# Patient Record
Sex: Female | Born: 1970 | Race: White | Hispanic: No | Marital: Married | Smoking: Current every day smoker
Health system: Southern US, Community
[De-identification: ages and names within clinical notes are randomized; demographics above are authoritative.]

## PROBLEM LIST (undated history)

## (undated) DIAGNOSIS — R112 Nausea with vomiting, unspecified: Secondary | ICD-10-CM

## (undated) DIAGNOSIS — H3530 Unspecified macular degeneration: Secondary | ICD-10-CM

## (undated) DIAGNOSIS — H3553 Other dystrophies primarily involving the sensory retina: Secondary | ICD-10-CM

## (undated) DIAGNOSIS — M5136 Other intervertebral disc degeneration, lumbar region: Secondary | ICD-10-CM

## (undated) DIAGNOSIS — F419 Anxiety disorder, unspecified: Secondary | ICD-10-CM

## (undated) DIAGNOSIS — Z8489 Family history of other specified conditions: Secondary | ICD-10-CM

## (undated) DIAGNOSIS — E7800 Pure hypercholesterolemia, unspecified: Secondary | ICD-10-CM

## (undated) DIAGNOSIS — M797 Fibromyalgia: Secondary | ICD-10-CM

## (undated) DIAGNOSIS — H353190 Nonexudative age-related macular degeneration, unspecified eye, stage unspecified: Secondary | ICD-10-CM

## (undated) DIAGNOSIS — H539 Unspecified visual disturbance: Secondary | ICD-10-CM

## (undated) DIAGNOSIS — E0590 Thyrotoxicosis, unspecified without thyrotoxic crisis or storm: Secondary | ICD-10-CM

## (undated) DIAGNOSIS — E785 Hyperlipidemia, unspecified: Secondary | ICD-10-CM

## (undated) DIAGNOSIS — Z9889 Other specified postprocedural states: Secondary | ICD-10-CM

## (undated) DIAGNOSIS — M51369 Other intervertebral disc degeneration, lumbar region without mention of lumbar back pain or lower extremity pain: Secondary | ICD-10-CM

## (undated) DIAGNOSIS — G43909 Migraine, unspecified, not intractable, without status migrainosus: Secondary | ICD-10-CM

## (undated) DIAGNOSIS — M5126 Other intervertebral disc displacement, lumbar region: Secondary | ICD-10-CM

## (undated) DIAGNOSIS — E0500 Thyrotoxicosis with diffuse goiter without thyrotoxic crisis or storm: Secondary | ICD-10-CM

## (undated) DIAGNOSIS — K219 Gastro-esophageal reflux disease without esophagitis: Secondary | ICD-10-CM

## (undated) HISTORY — DX: Unspecified macular degeneration: H35.30

## (undated) HISTORY — DX: Hyperlipidemia, unspecified: E78.5

## (undated) HISTORY — DX: Thyrotoxicosis with diffuse goiter without thyrotoxic crisis or storm: E05.00

## (undated) HISTORY — DX: Nonexudative age-related macular degeneration, unspecified eye, stage unspecified: H35.3190

## (undated) HISTORY — DX: Pure hypercholesterolemia, unspecified: E78.00

## (undated) HISTORY — DX: Other dystrophies primarily involving the sensory retina: H35.53

## (undated) HISTORY — DX: Unspecified visual disturbance: H53.9

## (undated) HISTORY — DX: Other intervertebral disc degeneration, lumbar region: M51.36

## (undated) HISTORY — DX: Other intervertebral disc displacement, lumbar region: M51.26

## (undated) HISTORY — DX: Migraine, unspecified, not intractable, without status migrainosus: G43.909

## (undated) HISTORY — DX: Anxiety disorder, unspecified: F41.9

## (undated) HISTORY — DX: Other intervertebral disc degeneration, lumbar region without mention of lumbar back pain or lower extremity pain: M51.369

---

## 2002-10-22 ENCOUNTER — Ambulatory Visit (HOSPITAL_COMMUNITY): Admission: RE | Admit: 2002-10-22 | Discharge: 2002-10-22 | Payer: Self-pay | Admitting: Family Medicine

## 2002-10-22 ENCOUNTER — Encounter: Payer: Self-pay | Admitting: Family Medicine

## 2003-08-08 HISTORY — PX: PARTIAL HYSTERECTOMY: SHX80

## 2003-08-08 HISTORY — PX: APPENDECTOMY: SHX54

## 2004-08-04 HISTORY — PX: PARTIAL HYSTERECTOMY: SHX80

## 2004-08-04 HISTORY — PX: APPENDECTOMY: SHX54

## 2006-08-07 HISTORY — PX: LIPOMA RESECTION: SHX23

## 2006-12-13 HISTORY — PX: OTHER SURGICAL HISTORY: SHX169

## 2007-04-03 ENCOUNTER — Encounter: Admission: RE | Admit: 2007-04-03 | Discharge: 2007-04-03 | Payer: Self-pay | Admitting: Obstetrics and Gynecology

## 2007-08-30 ENCOUNTER — Ambulatory Visit: Payer: Self-pay | Admitting: Internal Medicine

## 2007-08-30 LAB — CONVERTED CEMR LAB
ALT: 33 units/L (ref 0–35)
AST: 27 units/L (ref 0–37)
Albumin: 3.7 g/dL (ref 3.5–5.2)
Alkaline Phosphatase: 95 units/L (ref 39–117)
BUN: 4 mg/dL — ABNORMAL LOW (ref 6–23)
Bilirubin, Direct: 0.1 mg/dL (ref 0.0–0.3)
CO2: 27 meq/L (ref 19–32)
Calcium: 9.4 mg/dL (ref 8.4–10.5)
Chloride: 105 meq/L (ref 96–112)
Creatinine, Ser: 0.8 mg/dL (ref 0.4–1.2)
GFR calc Af Amer: 104 mL/min
GFR calc non Af Amer: 86 mL/min
Glucose, Bld: 101 mg/dL — ABNORMAL HIGH (ref 70–99)
Potassium: 4 meq/L (ref 3.5–5.1)
Sed Rate: 12 mm/hr (ref 0–25)
Sodium: 138 meq/L (ref 135–145)
TSH: 0.03 microintl units/mL — ABNORMAL LOW (ref 0.35–5.50)
Tissue Transglutaminase Ab, IgA: 0.7 units (ref <7)
Total Bilirubin: 0.5 mg/dL (ref 0.3–1.2)
Total Protein: 6.4 g/dL (ref 6.0–8.3)

## 2007-09-05 ENCOUNTER — Telehealth: Payer: Self-pay | Admitting: Endocrinology

## 2007-09-09 ENCOUNTER — Encounter: Payer: Self-pay | Admitting: Endocrinology

## 2007-09-19 ENCOUNTER — Encounter: Payer: Self-pay | Admitting: Internal Medicine

## 2007-09-19 ENCOUNTER — Ambulatory Visit: Payer: Self-pay | Admitting: Internal Medicine

## 2007-09-26 ENCOUNTER — Encounter: Payer: Self-pay | Admitting: Endocrinology

## 2007-09-28 ENCOUNTER — Encounter: Payer: Self-pay | Admitting: Endocrinology

## 2007-10-09 ENCOUNTER — Ambulatory Visit: Payer: Self-pay | Admitting: Endocrinology

## 2007-10-17 ENCOUNTER — Telehealth (INDEPENDENT_AMBULATORY_CARE_PROVIDER_SITE_OTHER): Payer: Self-pay | Admitting: *Deleted

## 2007-10-23 ENCOUNTER — Telehealth (INDEPENDENT_AMBULATORY_CARE_PROVIDER_SITE_OTHER): Payer: Self-pay | Admitting: *Deleted

## 2007-10-23 ENCOUNTER — Ambulatory Visit: Payer: Self-pay | Admitting: Endocrinology

## 2007-10-23 LAB — CONVERTED CEMR LAB: TSH: 0.07 microintl units/mL — ABNORMAL LOW (ref 0.35–5.50)

## 2007-11-05 ENCOUNTER — Telehealth (INDEPENDENT_AMBULATORY_CARE_PROVIDER_SITE_OTHER): Payer: Self-pay | Admitting: *Deleted

## 2007-11-11 ENCOUNTER — Ambulatory Visit: Payer: Self-pay | Admitting: Endocrinology

## 2007-11-11 LAB — CONVERTED CEMR LAB
Free T4: 0.7 ng/dL (ref 0.6–1.6)
TSH: 0.05 microintl units/mL — ABNORMAL LOW (ref 0.35–5.50)

## 2007-12-02 ENCOUNTER — Telehealth (INDEPENDENT_AMBULATORY_CARE_PROVIDER_SITE_OTHER): Payer: Self-pay | Admitting: *Deleted

## 2007-12-11 ENCOUNTER — Ambulatory Visit: Payer: Self-pay | Admitting: Endocrinology

## 2007-12-11 DIAGNOSIS — M25569 Pain in unspecified knee: Secondary | ICD-10-CM | POA: Insufficient documentation

## 2007-12-11 DIAGNOSIS — M797 Fibromyalgia: Secondary | ICD-10-CM | POA: Insufficient documentation

## 2007-12-11 LAB — CONVERTED CEMR LAB
Free T4: 0.6 ng/dL (ref 0.6–1.6)
TSH: 1.7 microintl units/mL (ref 0.35–5.50)

## 2008-01-20 ENCOUNTER — Encounter: Payer: Self-pay | Admitting: Endocrinology

## 2008-01-22 ENCOUNTER — Ambulatory Visit: Payer: Self-pay | Admitting: Endocrinology

## 2008-02-10 ENCOUNTER — Ambulatory Visit: Payer: Self-pay | Admitting: Endocrinology

## 2008-02-10 LAB — CONVERTED CEMR LAB
Free T4: 0.8 ng/dL (ref 0.6–1.6)
TSH: 1.17 microintl units/mL (ref 0.35–5.50)

## 2008-02-17 ENCOUNTER — Encounter: Payer: Self-pay | Admitting: Endocrinology

## 2008-02-19 ENCOUNTER — Ambulatory Visit: Payer: Self-pay | Admitting: Cardiology

## 2008-02-24 ENCOUNTER — Ambulatory Visit: Payer: Self-pay | Admitting: Cardiology

## 2008-03-31 ENCOUNTER — Ambulatory Visit: Payer: Self-pay | Admitting: Cardiology

## 2008-04-10 ENCOUNTER — Telehealth (INDEPENDENT_AMBULATORY_CARE_PROVIDER_SITE_OTHER): Payer: Self-pay | Admitting: *Deleted

## 2008-04-22 ENCOUNTER — Encounter: Admission: RE | Admit: 2008-04-22 | Discharge: 2008-04-22 | Payer: Self-pay | Admitting: Obstetrics and Gynecology

## 2008-04-22 ENCOUNTER — Ambulatory Visit: Payer: Self-pay | Admitting: Endocrinology

## 2008-04-22 LAB — CONVERTED CEMR LAB: TSH: 1.47 microintl units/mL (ref 0.35–5.50)

## 2008-04-29 ENCOUNTER — Telehealth (INDEPENDENT_AMBULATORY_CARE_PROVIDER_SITE_OTHER): Payer: Self-pay | Admitting: *Deleted

## 2008-05-08 ENCOUNTER — Ambulatory Visit (HOSPITAL_COMMUNITY): Admission: RE | Admit: 2008-05-08 | Discharge: 2008-05-08 | Payer: Self-pay | Admitting: Internal Medicine

## 2008-05-08 ENCOUNTER — Telehealth: Payer: Self-pay | Admitting: Internal Medicine

## 2008-05-08 DIAGNOSIS — K625 Hemorrhage of anus and rectum: Secondary | ICD-10-CM | POA: Insufficient documentation

## 2008-05-13 ENCOUNTER — Telehealth: Payer: Self-pay | Admitting: Internal Medicine

## 2008-05-15 DIAGNOSIS — F411 Generalized anxiety disorder: Secondary | ICD-10-CM | POA: Insufficient documentation

## 2008-05-15 DIAGNOSIS — R197 Diarrhea, unspecified: Secondary | ICD-10-CM | POA: Insufficient documentation

## 2008-05-15 DIAGNOSIS — F3289 Other specified depressive episodes: Secondary | ICD-10-CM | POA: Insufficient documentation

## 2008-05-15 DIAGNOSIS — F329 Major depressive disorder, single episode, unspecified: Secondary | ICD-10-CM | POA: Insufficient documentation

## 2008-05-15 DIAGNOSIS — Z8719 Personal history of other diseases of the digestive system: Secondary | ICD-10-CM | POA: Insufficient documentation

## 2008-05-21 ENCOUNTER — Ambulatory Visit: Payer: Self-pay | Admitting: Internal Medicine

## 2008-05-22 ENCOUNTER — Encounter: Payer: Self-pay | Admitting: Internal Medicine

## 2008-05-22 ENCOUNTER — Ambulatory Visit: Payer: Self-pay | Admitting: Internal Medicine

## 2008-05-25 ENCOUNTER — Encounter: Payer: Self-pay | Admitting: Internal Medicine

## 2008-06-01 ENCOUNTER — Ambulatory Visit: Payer: Self-pay | Admitting: Endocrinology

## 2008-06-01 ENCOUNTER — Telehealth: Payer: Self-pay | Admitting: Internal Medicine

## 2008-06-01 LAB — CONVERTED CEMR LAB
Free T4: 0.8 ng/dL (ref 0.6–1.6)
TSH: 0.12 microintl units/mL — ABNORMAL LOW (ref 0.35–5.50)

## 2008-06-02 ENCOUNTER — Telehealth: Payer: Self-pay | Admitting: Endocrinology

## 2008-06-09 ENCOUNTER — Encounter: Payer: Self-pay | Admitting: Endocrinology

## 2008-06-24 ENCOUNTER — Telehealth (INDEPENDENT_AMBULATORY_CARE_PROVIDER_SITE_OTHER): Payer: Self-pay | Admitting: *Deleted

## 2008-07-08 ENCOUNTER — Ambulatory Visit: Payer: Self-pay | Admitting: Endocrinology

## 2008-07-09 ENCOUNTER — Telehealth (INDEPENDENT_AMBULATORY_CARE_PROVIDER_SITE_OTHER): Payer: Self-pay | Admitting: *Deleted

## 2008-08-17 ENCOUNTER — Ambulatory Visit: Payer: Self-pay | Admitting: Endocrinology

## 2008-08-17 DIAGNOSIS — E890 Postprocedural hypothyroidism: Secondary | ICD-10-CM | POA: Insufficient documentation

## 2008-08-18 ENCOUNTER — Telehealth (INDEPENDENT_AMBULATORY_CARE_PROVIDER_SITE_OTHER): Payer: Self-pay | Admitting: *Deleted

## 2008-08-19 ENCOUNTER — Telehealth (INDEPENDENT_AMBULATORY_CARE_PROVIDER_SITE_OTHER): Payer: Self-pay | Admitting: *Deleted

## 2008-10-06 ENCOUNTER — Ambulatory Visit: Payer: Self-pay | Admitting: Endocrinology

## 2008-10-19 ENCOUNTER — Telehealth: Payer: Self-pay | Admitting: Endocrinology

## 2008-11-24 ENCOUNTER — Ambulatory Visit: Payer: Self-pay | Admitting: Endocrinology

## 2008-11-24 LAB — CONVERTED CEMR LAB: TSH: 0.16 microintl units/mL — ABNORMAL LOW (ref 0.35–5.50)

## 2009-01-15 ENCOUNTER — Ambulatory Visit: Payer: Self-pay | Admitting: Endocrinology

## 2009-01-15 ENCOUNTER — Telehealth: Payer: Self-pay | Admitting: Endocrinology

## 2009-01-19 ENCOUNTER — Telehealth (INDEPENDENT_AMBULATORY_CARE_PROVIDER_SITE_OTHER): Payer: Self-pay | Admitting: *Deleted

## 2010-12-20 NOTE — Assessment & Plan Note (Signed)
[facility]                          [facility]   NAME:[name] [name], [name] [name]                     MRN:          [id number]  DATE:02/19/2008                            DOB:          1971/03/19    REFERRING PHYSICIAN:  Fara Chute   REQUESTING PHYSICIAN:  Dr. Fara Chute   REASON FOR CONSULTATION:  Feeling of palpitations and elevated heart  rate.   HISTORY OF PRESENT ILLNESS:  [name] [name] is a 37-year-woman with a  history of thyroid disease, specifically Graves disease, managed by Dr.  Romero Belling, on methimazole.  The patient states that this was  diagnosed back in February 2009 after it was noted that she had a  relatively elevated heart rate in January 2009 during a colonoscopy done  by Dr. Lina Sar.  She was placed on Inderal at that time and with  management of her thyroid disease felt significantly better.  She was  eventually weaned off of Inderal and has been off the medicine for  approximately the last 4 weeks.  She has noticed a general sense that  her heart rate has been increased and palpitations, although she states  that she saw Dr. Everardo All just recently and that her thyroid numbers were  in-line.  She has been placed back on low-dose Inderal in the meanwhile  and referred to [place] for further assessment.  She otherwise has no general  sense of exertional chest pain or dyspnea.  She has had no frank  dizziness or syncope.  Her electrocardiogram today is entirely normal  showing sinus rhythm at 84 beats per minute.  Dr. Neita Carp has felt there  might be an element of anxiety and placed her on alprazolam, although  she states that she was nauseated with this medicine and has not been  able to tolerate it.   ALLERGIES:  No known drug allergies.   PRESENT MEDICATIONS:  1. Methimazole 5 mg p.o. daily.  2. Propranolol 10 mg p.o. t.i.d.   PAST MEDICAL HISTORY:  As outlined above.  Also, a prior history of  partial  hysterectomy, appendectomy, and removal of a lipoma from the  back.  She has no cardiovascular disease history, no diabetes mellitus,  and no hypertension.  Lipid profile in 2007 demonstrated hyperlipidemia  with an LDL of 228, triglycerides of 182, HDL of 31, and LDL of 161.   SOCIAL HISTORY:  The patient denies any tobacco or alcohol use.  She is  a former smoker, however.  She states she has cut caffeine out of her  diet over the last one and half week, although has not noticed any  significant improvement.   FAMILY HISTORY:  Significant for cardiovascular disease, premature in  the patient's father.  No obvious dysrhythmia described in the family.   REVIEW OF SYSTEMS:  As outlined above.  She has had no fevers or chills,  cough, hemoptysis, lower extremity edema, orthopnea, or PND.  Bowel and  bladder habits have been normal recently.  She reports compliance with  her medications.  Otherwise negative.   PHYSICAL EXAMINATION:  Blood pressure 137/90, heart  rate in the 80s-90s  and regular, weight is 143 pounds, and height 5 feet 4 inches.  The patient is in no acute distress.  HEENT:  Conjunctivae normal.  Oropharynx clear.  NECK:  Supple.  No elevated jugular venous pressure.  No audible bruits.  LUNGS:  Clear without labored breathing at rest.  CARDIAC:  Reveals a regular rate and rhythm.  No pathologic murmurs or  S3 gallop.  No pericardial rub.  ABDOMEN:  Soft and nontender.  Normoactive bowel sounds.  EXTREMITIES:  Exhibit no frank pitting edema.  Pulses are 2+.  SKIN:  Warm and dry.  MUSCULOSKELETAL:  No kyphosis noted.  NEUROPSYCHIATRIC:  Alert and oriented x3.  Affect seems appropriate.   IMPRESSION AND RECOMMENDATIONS:  General sense of increased heart rate  and palpitations in a 40 year old woman with a history of Graves  disease, being managed with methimazole and propranolol, which was  initially weaned off, but has now been restarted.  My understanding is  that  recent thyroid studies indicate good control of thyroid disease.  The patient's resting electrocardiogram is normal.  She has had no frank  dizziness or syncope.  There are no pathologic murmurs on examination or  an S3 gallop.  I doubt that there is a primary cardiac cause for this.  She remains very concerned about this however and would like to undergo  at least some baseline cardiac evaluation.  What we have decided to do  after discussing the matter is place a 24-hour Holter to better document  heart rate variability and exclude any obvious paroxysmal dysrhythmias,  which I doubt at this point.  She states that she has these symptoms  daily, and I suspect a 24-hour monitor would be adequate.  We will also  obtain a 2-D echocardiogram to exclude cardiomyopathy.  I doubt there  will be any major valvular abnormalities or pericardial disease based on  exam.  We will then have her return to the office to discuss these  results.  In the meanwhile, I have asked her to increase her Inderal to  20 mg p.o. t.i.d.     Jonelle Sidle, MD  Electronically Signed    SGM/MedQ  DD: 02/19/2008  DT: 02/20/2008  Job #: [id number]   cc:   Gregary Signs A. Everardo All, MD  Fara Chute

## 2010-12-20 NOTE — Assessment & Plan Note (Signed)
[facility]                          [facility]   NAME:[name] [name] HEBEL                     MRN:          [id number]  DATE:03/31/2008                            DOB:          03-18-1971    PRIMARY CARE PHYSICIAN:  [name] [name].   ENDOCRINOLOGIST:  [name] [name]   REASON FOR VISIT:  Followup cardiac testing and palpitations.   HISTORY OF PRESENT ILLNESS:  I saw [name] [name] back in July 2009.  She  was referred at that time with a general sense of palpitations and  increased heart rate in the setting of Graves disease being managed with  methimazole and low-dose propranolol.  I increased her propranolol,  which she states significantly improved her symptoms.  We also placed a  Holter monitor.  She did not have any significant dysrhythmias.  Heart  rate ranged from 59 up to 131 with a mean of 87 beats per minute.  She  had an echocardiogram obtained, which demonstrates normal ventricle  systolic function at 50-55% without regional wall motion abnormalities  and no significant valvular abnormalities or pericardial effusion.  I  reviewed these studies with the patient today and she was reassured.  Overall, she reports feeling much better.   ALLERGIES:  No known drug allergies.   PRESENT MEDICATIONS:  1. Methimazole 5 mg p.o. daily.  2. Propranolol 20 mg p.o. t.i.d.   REVIEW OF SYSTEMS:  as described in the history present illness,  otherwise negative.   PHYSICAL EXAMINATION:  VITAL SIGNS:  Blood pressure is 118/76, heart  rate is 87 and regular, weight is 147 pounds.  NECK:  No elevated jugular venous pressure.  LUNGS:  Clear without labored breathing.  HEART:  Regular rate and rhythm.  No pathologic murmur or S3 gallop.  EXTREMITIES:  No pitting edema.   IMPRESSIONS AND RECOMMENDATIONS:  Palpitations in the setting of Graves  disease treated with methimazole and propranolol.  The patient feels  much better on  propranolol 20 mg p.o. t.i.d.  She has no evidence of  major cardiac structural disease or significant dysrhythmia.  Would not  suggest any further cardiac testing at this point.  She will continue to  follow up with  [name] [name] and [name] [name].  Would suggest continuing propranolol  particularly until further management of her thyroid disease has been  determined.     [name] Sidle, [name]  Electronically Signed    SGM/MedQ  DD: 03/31/2008  DT: 04/01/2008  Job #: [id number]   cc:   [name] Modena A. Everardo All, [name]

## 2010-12-20 NOTE — Assessment & Plan Note (Signed)
[facility]                         GASTROENTEROLOGY OFFICE NOTE   NAME:Dorsi, JEWELIA BOCCHINO                     MRN:          [id number]  DATE:08/30/2007                            DOB:          1971/06/15    NEW PATIENT EVALUATION  Ms. Covington is a 40 year old white female who comes in today for  evaluation of constant diarrhea.  She was evaluated in 2002 by Dr. Jena Gauss  in [place] for what was thought to be a C. difficile colitis as well  as IBS.  She at that time had a flexible sigmoidoscopy.  Her stool was  positive  for C. diff toxin.  She was treated with Flagyl  and had a  complete resolution of her diarrhea, which only later on recurred and  has lasted since then.  She describes urgent, looseas well as soft  stools post prandially within 30-40 minutes of eating.  This occurs  about twice a day, after lunch and after supper.  She does not eat  breakfast.  Her weight has increased somewhat, about 10 pounds.  Her  appetite has been good.  There is no nausea or vomiting.  She usually  has enough warning with crampy abdominal pain so she can make it to the  bathroom.  She feels generally much better after having a bowel  movement.  These symptoms never occur at night.  She has been on Chantix  for the past 26 days and has actually developed some constipation , but  she is going off Chantix the next week, and she is anticipating  recurrence of the diarrhea.   MEDICATIONS:  1. Chantix 1 daily.  2. Midrin p.r.n.   PAST HISTORY:  Colon polyps in her father.  She had depression.  Sinus  allergies.  Anxiety.  C. difficile diarrhea in 2002.  Flexible  sigmoidoscopy in 2002.   OPERATIONS:  Hysterectomy in December, 2005.  Appendectomy in December,  2005.  Lipoma removed from the back in May, 2008.  Bulging disk in the  neck, middle back, and lower back.   FAMILY HISTORY:  Positive for colon polyps in her father, heart disease  in father, uterine  cancer in maternal grandmother, diabetes in the  brother, breast cancer in the sister.   SOCIAL HISTORY:  Married with one child.  She has a Naval architect.  She works as a Occupational psychologist.  She is an ex-smoker,  stopped 26 days ago.  She does not drink any alcohol.   REVIEW OF SYSTEMS:  Positive for eye glasses, allergies, back pain.   PHYSICAL EXAMINATION:  Blood pressure 122/70, pulse 86, weight 137  pounds.  She was alert and oriented, very pleasant.  She laughed almost after  every sentence that she said, which was somewhat inappropriate.  Sclerae are anicteric.  There was no exophthalmos.  No resting tremor of  the arms.  NECK:  Supple.  Thyroid was not enlarged.  LUNGS:  Clear to auscultation.  COR:  Normal S1 and S2.  SKIN:  Warm and dry without any spider nevi or stigmata of chronic liver  disease.  ABDOMEN:  Soft with normoactive bowel sounds.  No distention.  No  hyperactive rushes.  There was no tenderness in the left or right lower  quadrant.  RECTAL:  Normal rectal tone.  Stool was hemoccult negative.   IMPRESSION:  65. A 40 year old whiote female with a history of Clostridium difficile      diarrhea, resolved on Flagyl in 2002, now recurrence of diarrhea,      mostly post prandially, suggestive of irritable bowel      syndrome/diarrhea.  Rule out inflammatory bowel disease.  Rule out      hypermetabolic state such as hyperthyroidism.  2. Rule out celiac sprue.  Patient denies a history of anemia or      lactose intolerance.  3. Rule out functional diarrhea.   PLAN:  1. Test tissue transglutaminase levels, TSH, sed rate, and CMET,      specifically albumin.  2. Begin Bentyl 20 mg 1 p.o. b.i.d., electronically prescribed.  3. Consider adding Paxil 10-20 mg at bedtime.  4. Colonoscopy scheduled for biopsy to rule out microscopic      collagenous colitis.     Hedwig Morton. Juanda Chance, MD  Electronically Signed    DMB/MedQ  DD: 08/30/2007  DT:  08/30/2007  Job #: [id number]   cc:   Fara Chute

## 2012-06-26 DIAGNOSIS — M1990 Unspecified osteoarthritis, unspecified site: Secondary | ICD-10-CM | POA: Diagnosis present

## 2013-09-26 ENCOUNTER — Ambulatory Visit (INDEPENDENT_AMBULATORY_CARE_PROVIDER_SITE_OTHER): Payer: Medicare Other | Admitting: Diagnostic Neuroimaging

## 2013-09-26 ENCOUNTER — Encounter (INDEPENDENT_AMBULATORY_CARE_PROVIDER_SITE_OTHER): Payer: Self-pay

## 2013-09-26 ENCOUNTER — Encounter: Payer: Self-pay | Admitting: Diagnostic Neuroimaging

## 2013-09-26 VITALS — BP 126/88 | HR 85 | Temp 97.5°F | Ht 64.0 in | Wt 147.0 lb

## 2013-09-26 DIAGNOSIS — R200 Anesthesia of skin: Secondary | ICD-10-CM

## 2013-09-26 DIAGNOSIS — R209 Unspecified disturbances of skin sensation: Secondary | ICD-10-CM

## 2013-09-26 DIAGNOSIS — M542 Cervicalgia: Secondary | ICD-10-CM

## 2013-09-26 MED ORDER — GABAPENTIN 300 MG PO CAPS: 300.0000 mg | ORAL_CAPSULE | Freq: Three times a day (TID) | ORAL | Status: DC

## 2013-09-26 NOTE — Progress Notes (Signed)
[facility]  PATIENT: [name] [name] DOB: [birth year]  REFERRING CLINICIAN: Sasser HISTORY FROM: patient and husband REASON FOR VISIT: new consult   HISTORICAL  CHIEF COMPLAINT:  Chief Complaint  Patient presents with  . Back Pain    between shoulder blades,   . Tingling    both arms    HISTORY OF PRESENT ILLNESS:   43 year old right-handed female here for evaluation of pain and numbness in the upper back and bilateral arms. Has history of Graves' disease, dry macular dystrophy, hypercholesterolemia, migraine, anxiety.  For past 7 weeks patient has developed numbness and tingling between her scapula, radiating in her bilateral shoulders and arms. Symptoms are gradually been worsening. No trauma or triggering events. No bowel or bladder dysfunction.   Patient has dry macular dystrophy diagnosed 2011, with minimal light perception and peripheral motion perception in the right eye and patch she visual acuity in the left eye. 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  In 2004 patient had low back pain radiating to right leg, diagnosed with disc bulging and treated conservatively.   REVIEW OF SYSTEMS: Full 14 system review of systems performed and notable only for a numbness feeling hot legally blind loss of vision weight gain fatigue palpitation.  ALLERGIES: Allergies  Allergen Reactions  . Prednisone     HOME MEDICATIONS: No outpatient prescriptions prior to visit.   No facility-administered medications prior to visit.    PAST MEDICAL HISTORY: Past Medical History  Diagnosis Date  . Graves disease   . Macular degeneration, dry   . High cholesterol   . Migraine   . Anxiety     PAST SURGICAL HISTORY: Past Surgical History  Procedure Laterality Date  . Partial hysterectomy  08-04-2004  . Appendectomy  08-04-2004  . Other surgical history  12-13-2006    fatty tumor removed    FAMILY HISTORY: Family History  Problem  Relation Age of Onset  . Other Father     degenerative disc disease  . Heart attack Father   . Diabetes Brother   . Breast cancer Sister     SOCIAL HISTORY:  History   Social History  . Marital Status: Married    Spouse Name: Legrand Como    Number of Children: 1  . Years of Education: College   Occupational History  . other     Disability   Social History Main Topics  . Smoking status: Current Every Day Smoker -- 1.00 packs/day for 20 years    Types: Cigarettes  . Smokeless tobacco: Never Used  . Alcohol Use: No  . Drug Use: No  . Sexual Activity: Not on file   Other Topics Concern  . Not on file   Social History Narrative   Patient lives at home with family.   Caffeine Use: 3 cans of soda daily     PHYSICAL EXAM  Filed Vitals:   09/26/13 0910  BP: 126/88  Pulse: 85  Temp: 97.5 F (36.4 C)  TempSrc: Oral  Height: 5\' 4"  (1.626 m)  Weight: 147 lb (66.679 kg)    Not recorded    Body mass index is 25.22 kg/(m^2).  GENERAL EXAM: Patient is in no distress; well developed, nourished and groomed; neck is supple  CARDIOVASCULAR: Regular rate and rhythm, no murmurs, no carotid bruits  NEUROLOGIC: MENTAL STATUS: awake, alert, oriented to person, place and time, recent and remote memory intact, normal attention and concentration, language fluent, comprehension intact, naming intact, fund of knowledge appropriate  CRANIAL NERVE: no papilledema on fundoscopic exam, pupils equal and reactive to light; RIGHT EYE MINIMAL LIGHT AND MOTION PERCEPTION IN THE INFERIOR PERIPHERAL FIELDS. LEFT EYE ABLE TO COUNT FINGERS. Extraocular muscles intact, no nystagmus, facial sensation and strength symmetric, hearing intact, palate elevates symmetrically, uvula midline, shoulder shrug symmetric, tongue midline. MOTOR: normal bulk and tone, full strength in the BUE, BLE SENSORY: DECR PP IN RIGHT HAND DIGIT 2. POSITIVE PHALEN'S SIGN. NEG TINEL'S. COORDINATION: finger-nose-finger, fine  finger movements normal REFLEXES: deep tendon reflexes present; SLIGHTLY INCREASED IN LUE (2+). POSITIVE LEFT HOFFMANS.  GAIT/STATION: narrow based gait; able to walk on toes, heels and tandem; romberg is negative    DIAGNOSTIC DATA (LABS, IMAGING, TESTING) - I reviewed patient records, labs, notes, testing and imaging myself where available.  No results found for this basename: WBC, HGB, HCT, MCV, PLT      Component Value Date/Time   NA 138 08/30/2007 1627   K 4.0 08/30/2007 1627   CL 105 08/30/2007 1627   CO2 27 08/30/2007 1627   GLUCOSE 101* 08/30/2007 1627   BUN 4* 08/30/2007 1627   CREATININE 0.8 08/30/2007 1627   CALCIUM 9.4 08/30/2007 1627   PROT 6.4 08/30/2007 1627   ALBUMIN 3.7 08/30/2007 1627   AST 27 08/30/2007 1627   ALT 33 08/30/2007 1627   ALKPHOS 95 08/30/2007 1627   BILITOT 0.5 08/30/2007 1627   GFRNONAA 86 08/30/2007 1627   GFRAA 104 08/30/2007 1627   No results found for this basename: CHOL, HDL, LDLCALC, LDLDIRECT, TRIG, CHOLHDL   No results found for this basename: HGBA1C   No results found for this basename: VITAMINB12   Lab Results  Component Value Date   TSH 28.25* 01/15/2009     ASSESSMENT AND PLAN  43 y.o. year old female here with pain and numbness originating in her upper thoracic, lower cervical region posteriorly, radiating into bilateral arms. May represent cervical/thoracic radiculopathy. Peripheral neuropathy less likely.  PLAN: - MRI cervical - gabapentin 300mg  qhs   Orders Placed This Encounter  Procedures  . MR Cervical Spine Wo Contrast   Return in about 3 months (around 12/24/2013).    Penni Bombard, MD 8/76/8115, 72:62 AM Certified in Neurology, Neurophysiology and Neuroimaging  [facility] [address], [place], [place] [address] [phone]

## 2013-09-26 NOTE — Patient Instructions (Signed)
Start gabapentin 1 tab at bedtime; gradually increase to twice or three times per day.

## 2013-09-30 ENCOUNTER — Encounter: Payer: Self-pay | Admitting: Diagnostic Neuroimaging

## 2013-09-30 DIAGNOSIS — M5412 Radiculopathy, cervical region: Secondary | ICD-10-CM

## 2013-10-01 ENCOUNTER — Telehealth: Payer: Self-pay | Admitting: Diagnostic Neuroimaging

## 2013-10-01 ENCOUNTER — Ambulatory Visit
Admission: RE | Admit: 2013-10-01 | Discharge: 2013-10-01 | Disposition: A | Discharge status: Home / Self Care | Payer: Medicare Other | Source: Ambulatory Visit | Attending: Diagnostic Neuroimaging | Admitting: Diagnostic Neuroimaging

## 2013-10-01 DIAGNOSIS — M542 Cervicalgia: Secondary | ICD-10-CM

## 2013-10-01 DIAGNOSIS — R200 Anesthesia of skin: Secondary | ICD-10-CM

## 2013-10-01 DIAGNOSIS — R209 Unspecified disturbances of skin sensation: Secondary | ICD-10-CM

## 2013-10-01 DIAGNOSIS — M5010 Cervical disc disorder with radiculopathy, unspecified cervical region: Secondary | ICD-10-CM

## 2013-10-01 NOTE — Telephone Encounter (Signed)
I called patient with MRI results. Recommend PT and pain mgmt. I will order.   Penni Bombard, MD 5/36/4680, 3:21 PM Certified in Neurology, Neurophysiology and Neuroimaging  [facility] [address] [place], [place] [address] [phone]

## 2013-12-25 ENCOUNTER — Ambulatory Visit: Payer: Medicare Other | Admitting: Diagnostic Neuroimaging

## 2014-01-01 ENCOUNTER — Encounter: Payer: Self-pay | Admitting: Endocrinology

## 2014-01-01 ENCOUNTER — Ambulatory Visit (INDEPENDENT_AMBULATORY_CARE_PROVIDER_SITE_OTHER): Payer: Medicare Other | Admitting: Endocrinology

## 2014-01-01 VITALS — BP 124/78 | HR 98 | Temp 98.6°F

## 2014-01-01 DIAGNOSIS — E890 Postprocedural hypothyroidism: Secondary | ICD-10-CM

## 2014-01-01 DIAGNOSIS — E0500 Thyrotoxicosis with diffuse goiter without thyrotoxic crisis or storm: Secondary | ICD-10-CM

## 2014-01-01 LAB — T4, FREE: FREE T4: 1.06 ng/dL (ref 0.60–1.60)

## 2014-01-01 LAB — T3, FREE: T3 FREE: 3 pg/mL (ref 2.3–4.2)

## 2014-01-01 LAB — TSH: TSH: 1.02 u[IU]/mL (ref 0.35–4.50)

## 2014-01-01 NOTE — Progress Notes (Signed)
Subjective:    Patient ID: [name] [name], female    DOB: [birth year], 43 y.o.   MRN: [id number]  HPI Pt was found to have hyperthyroidism due to grave's dz in 2009, when I first saw her.  She had I-131 rx, and has been on synthroid since 2010.  He has never taken non-prescribed thyroid hormone therapy.  He has never taken kelp or any other type of non-prescribed thyroid product.  She is not considering a pregnancy (she has had TAH).  He has never had thyroid surgery, or XRT to the neck.  He has never been on amiodarone or lithium.   Pt states 3 months of slight swelling at the anterior neck, and assoc weight gain (despite poor appetite).  In April of 2015, synthroid was increased to its current 100 mcg/day.   Past Medical History  Diagnosis Date  . Graves disease   . Macular degeneration, dry   . High cholesterol   . Migraine   . Anxiety     Past Surgical History  Procedure Laterality Date  . Partial hysterectomy  08-04-2004  . Appendectomy  08-04-2004  . Other surgical history  12-13-2006    fatty tumor removed    History   Social History  . Marital Status: Married    Spouse Name: Legrand Como    Number of Children: 1  . Years of Education: College   Occupational History  . other     Disability   Social History Main Topics  . Smoking status: Current Every Day Smoker -- 1.00 packs/day for 20 years    Types: Cigarettes  . Smokeless tobacco: Never Used  . Alcohol Use: No  . Drug Use: No  . Sexual Activity: Not on file   Other Topics Concern  . Not on file   Social History Narrative   Patient lives at home with family.   Caffeine Use: 3 cans of soda daily    Current Outpatient Prescriptions on File Prior to Visit  Medication Sig Dispense Refill  . ALPRAZolam (XANAX) 0.5 MG tablet Take 0.5 mg by mouth at bedtime as needed for anxiety.      . fenofibrate (TRICOR) 145 MG tablet Take 145 mg by mouth daily.      Marland Kitchen gabapentin (NEURONTIN) 300 MG capsule Take 1 capsule  (300 mg total) by mouth 3 (three) times daily.  90 capsule  11  . propranolol ER (INDERAL LA) 120 MG 24 hr capsule Take 1 capsule by mouth daily.      . cyclobenzaprine (FLEXERIL) 10 MG tablet Take 10 mg by mouth 3 (three) times daily as needed for muscle spasms.      . naproxen (NAPROSYN) 500 MG tablet Take 2 tablets by mouth daily.      . SUMAtriptan (IMITREX) 100 MG tablet Take 100 mg by mouth every 2 (two) hours as needed for migraine or headache. May repeat in 2 hours if headache persists or recurs.       No current facility-administered medications on file prior to visit.    Allergies  Allergen Reactions  . Prednisone     Family History  Problem Relation Age of Onset  . Other Father     degenerative disc disease  . Heart attack Father   . Diabetes Brother   . Breast cancer Sister     BP 124/78  Pulse 98  Temp(Src) 98.6 F (37 C) (Oral)  SpO2 99%     Review of Systems denies depression, hair loss,  muscle cramps, sob, fever, memory loss, constipation, numbness, cold intolerance, dry skin, rhinorrhea, easy bruising, and syncope.  She has fatigue, headache, nausea, hoarseness, decreased vision, and dry cough.      Objective:   Physical Exam VS: see vs page GEN: no distress HEAD: head: no deformity eyes: no periorbital swelling.  Mild bilat proptosis external nose and ears are normal mouth: no lesion seen NECK: supple, thyroid is not enlarged CHEST WALL: no deformity LUNGS:  Clear to auscultation CV: reg rate and rhythm, no murmur ABD: abdomen is soft, nontender.  no hepatosplenomegaly.  not distended.  no hernia MUSCULOSKELETAL: muscle bulk and strength are grossly normal.  no obvious joint swelling.  gait is normal and steady EXTEMITIES: no deformity.  no ulcer on the feet.  feet are of normal color and temp.  no edema PULSES: dorsalis pedis intact bilat.  no carotid bruit NEURO:  cn 2-12 grossly intact.   readily moves all 4's.  sensation is intact to touch on  the feet SKIN:  Normal texture and temperature.  No rash or suspicious lesion is visible.   NODES:  None palpable at the neck PSYCH: alert, well-oriented.  Does not appear anxious nor depressed.   outside test results are reviewed: TSH=0.6 i reviewed records from Dr Quintin Alto. i have reviewed old records in epic and centricity, including old nuclear medicine reports. Lab Results  Component Value Date   TSH 1.02 01/01/2014      Assessment & Plan:  Post-I-131 hypothyroidism, well-replaced. Neck swelling, new: the fact that she is replaced at just 100 mcg/day raises the ? Of incomplete ablation.   Weight gain: not thyroid-related.

## 2014-01-01 NOTE — Patient Instructions (Addendum)
Let's recheck the thyroid ultrasound.  you will receive a phone call, about a day and time for an appointment. blood tests are being requested for you today.  We'll contact you with results.

## 2014-01-12 ENCOUNTER — Telehealth: Payer: Self-pay | Admitting: Endocrinology

## 2014-01-12 NOTE — Telephone Encounter (Signed)
Called and spoke with Bridger. Pt will be set up at [facility]. Left voice mail informing pt she should be receiving a phone call from [facility] about day and time for appointment,

## 2014-01-12 NOTE — Telephone Encounter (Signed)
Patient states she is to have an U/S on her thyroid  Dr. Loanne Drilling suggested her to go to [facility] to have this done as she had previously  Is there any type of hold up? Has it been scheduled?  Thank You :)

## 2014-01-16 ENCOUNTER — Telehealth: Payer: Self-pay | Admitting: Endocrinology

## 2014-01-16 NOTE — Telephone Encounter (Signed)
Patient was calling to see if Dr. Loanne Drilling received her ultra sound   Thank You :)

## 2014-01-16 NOTE — Telephone Encounter (Signed)
Pt advised that as of right now results have not been received.

## 2014-01-22 ENCOUNTER — Telehealth: Payer: Self-pay | Admitting: Endocrinology

## 2014-01-22 NOTE — Telephone Encounter (Signed)
Patient would like to know if her [place] results are avail?  Please advise patient   Thank You :)

## 2014-01-22 NOTE — Telephone Encounter (Signed)
Pt would like U/S results. Please advise.

## 2014-01-22 NOTE — Telephone Encounter (Signed)
i believe this was done at [facility], so i don't have results on epic.   Can other dr review?

## 2014-01-26 NOTE — Telephone Encounter (Signed)
Left voicemail informing pt that we do not have access to [facility]'s record. Instructed to pt have MD at hospital review or to have results faxed to office.

## 2014-02-02 ENCOUNTER — Other Ambulatory Visit: Payer: Self-pay | Admitting: Endocrinology

## 2014-02-02 ENCOUNTER — Encounter: Payer: Self-pay | Admitting: Endocrinology

## 2014-02-02 DIAGNOSIS — E890 Postprocedural hypothyroidism: Secondary | ICD-10-CM

## 2014-02-16 ENCOUNTER — Encounter: Payer: Self-pay | Admitting: Endocrinology

## 2014-03-26 DIAGNOSIS — F17200 Nicotine dependence, unspecified, uncomplicated: Secondary | ICD-10-CM | POA: Insufficient documentation

## 2014-09-08 DIAGNOSIS — E039 Hypothyroidism, unspecified: Secondary | ICD-10-CM | POA: Diagnosis not present

## 2014-09-15 DIAGNOSIS — R300 Dysuria: Secondary | ICD-10-CM | POA: Diagnosis not present

## 2014-09-22 DIAGNOSIS — R05 Cough: Secondary | ICD-10-CM | POA: Diagnosis not present

## 2014-09-22 DIAGNOSIS — Z720 Tobacco use: Secondary | ICD-10-CM | POA: Diagnosis not present

## 2014-09-22 DIAGNOSIS — J209 Acute bronchitis, unspecified: Secondary | ICD-10-CM | POA: Diagnosis not present

## 2014-11-11 DIAGNOSIS — Z720 Tobacco use: Secondary | ICD-10-CM | POA: Diagnosis not present

## 2014-11-11 DIAGNOSIS — E780 Pure hypercholesterolemia: Secondary | ICD-10-CM | POA: Diagnosis not present

## 2014-11-11 DIAGNOSIS — E782 Mixed hyperlipidemia: Secondary | ICD-10-CM | POA: Diagnosis not present

## 2014-11-11 DIAGNOSIS — E039 Hypothyroidism, unspecified: Secondary | ICD-10-CM | POA: Diagnosis not present

## 2014-11-17 DIAGNOSIS — G43919 Migraine, unspecified, intractable, without status migrainosus: Secondary | ICD-10-CM | POA: Diagnosis not present

## 2014-11-17 DIAGNOSIS — R5383 Other fatigue: Secondary | ICD-10-CM | POA: Diagnosis not present

## 2014-11-17 DIAGNOSIS — E039 Hypothyroidism, unspecified: Secondary | ICD-10-CM | POA: Diagnosis not present

## 2014-11-17 DIAGNOSIS — L570 Actinic keratosis: Secondary | ICD-10-CM | POA: Diagnosis not present

## 2014-11-17 DIAGNOSIS — E782 Mixed hyperlipidemia: Secondary | ICD-10-CM | POA: Diagnosis not present

## 2014-12-25 DIAGNOSIS — E039 Hypothyroidism, unspecified: Secondary | ICD-10-CM | POA: Diagnosis not present

## 2014-12-30 DIAGNOSIS — H5410 Blindness, one eye, low vision other eye, unspecified eyes: Secondary | ICD-10-CM | POA: Diagnosis not present

## 2014-12-30 DIAGNOSIS — H3550 Unspecified hereditary retinal dystrophy: Secondary | ICD-10-CM | POA: Diagnosis not present

## 2014-12-31 DIAGNOSIS — E08311 Diabetes mellitus due to underlying condition with unspecified diabetic retinopathy with macular edema: Secondary | ICD-10-CM | POA: Diagnosis not present

## 2014-12-31 DIAGNOSIS — H5410 Blindness, one eye, low vision other eye, unspecified eyes: Secondary | ICD-10-CM | POA: Diagnosis not present

## 2014-12-31 DIAGNOSIS — H3550 Unspecified hereditary retinal dystrophy: Secondary | ICD-10-CM | POA: Diagnosis not present

## 2015-01-01 DIAGNOSIS — H3550 Unspecified hereditary retinal dystrophy: Secondary | ICD-10-CM | POA: Diagnosis not present

## 2015-02-04 ENCOUNTER — Encounter: Payer: Self-pay | Admitting: Internal Medicine

## 2015-02-18 DIAGNOSIS — E039 Hypothyroidism, unspecified: Secondary | ICD-10-CM | POA: Diagnosis not present

## 2015-02-24 DIAGNOSIS — H5410 Blindness, one eye, low vision other eye, unspecified eyes: Secondary | ICD-10-CM | POA: Diagnosis not present

## 2015-02-24 DIAGNOSIS — H3550 Unspecified hereditary retinal dystrophy: Secondary | ICD-10-CM | POA: Diagnosis not present

## 2015-03-01 DIAGNOSIS — N3000 Acute cystitis without hematuria: Secondary | ICD-10-CM | POA: Diagnosis not present

## 2015-03-01 DIAGNOSIS — J019 Acute sinusitis, unspecified: Secondary | ICD-10-CM | POA: Diagnosis not present

## 2015-03-12 DIAGNOSIS — E782 Mixed hyperlipidemia: Secondary | ICD-10-CM | POA: Diagnosis not present

## 2015-03-12 DIAGNOSIS — R5383 Other fatigue: Secondary | ICD-10-CM | POA: Diagnosis not present

## 2015-03-12 DIAGNOSIS — E039 Hypothyroidism, unspecified: Secondary | ICD-10-CM | POA: Diagnosis not present

## 2015-03-19 DIAGNOSIS — J209 Acute bronchitis, unspecified: Secondary | ICD-10-CM | POA: Diagnosis not present

## 2015-03-19 DIAGNOSIS — G43919 Migraine, unspecified, intractable, without status migrainosus: Secondary | ICD-10-CM | POA: Diagnosis not present

## 2015-03-19 DIAGNOSIS — R5383 Other fatigue: Secondary | ICD-10-CM | POA: Diagnosis not present

## 2015-03-19 DIAGNOSIS — E039 Hypothyroidism, unspecified: Secondary | ICD-10-CM | POA: Diagnosis not present

## 2015-03-19 DIAGNOSIS — E782 Mixed hyperlipidemia: Secondary | ICD-10-CM | POA: Diagnosis not present

## 2015-04-23 DIAGNOSIS — E039 Hypothyroidism, unspecified: Secondary | ICD-10-CM | POA: Diagnosis not present

## 2015-06-04 DIAGNOSIS — Z1231 Encounter for screening mammogram for malignant neoplasm of breast: Secondary | ICD-10-CM | POA: Diagnosis not present

## 2015-06-07 DIAGNOSIS — E039 Hypothyroidism, unspecified: Secondary | ICD-10-CM | POA: Diagnosis not present

## 2015-06-07 DIAGNOSIS — R5383 Other fatigue: Secondary | ICD-10-CM | POA: Diagnosis not present

## 2015-06-21 DIAGNOSIS — H5411 Blindness, right eye, low vision left eye: Secondary | ICD-10-CM | POA: Diagnosis not present

## 2015-06-21 DIAGNOSIS — H3550 Unspecified hereditary retinal dystrophy: Secondary | ICD-10-CM | POA: Diagnosis not present

## 2015-06-30 DIAGNOSIS — M1811 Unilateral primary osteoarthritis of first carpometacarpal joint, right hand: Secondary | ICD-10-CM | POA: Diagnosis not present

## 2015-06-30 DIAGNOSIS — M180 Bilateral primary osteoarthritis of first carpometacarpal joints: Secondary | ICD-10-CM | POA: Diagnosis not present

## 2015-06-30 DIAGNOSIS — M1812 Unilateral primary osteoarthritis of first carpometacarpal joint, left hand: Secondary | ICD-10-CM | POA: Diagnosis not present

## 2015-07-22 DIAGNOSIS — R5383 Other fatigue: Secondary | ICD-10-CM | POA: Diagnosis not present

## 2015-07-22 DIAGNOSIS — E782 Mixed hyperlipidemia: Secondary | ICD-10-CM | POA: Diagnosis not present

## 2015-07-22 DIAGNOSIS — E039 Hypothyroidism, unspecified: Secondary | ICD-10-CM | POA: Diagnosis not present

## 2015-07-29 DIAGNOSIS — E039 Hypothyroidism, unspecified: Secondary | ICD-10-CM | POA: Diagnosis not present

## 2015-07-29 DIAGNOSIS — Z1389 Encounter for screening for other disorder: Secondary | ICD-10-CM | POA: Diagnosis not present

## 2015-07-29 DIAGNOSIS — F5101 Primary insomnia: Secondary | ICD-10-CM | POA: Diagnosis not present

## 2015-07-29 DIAGNOSIS — R5383 Other fatigue: Secondary | ICD-10-CM | POA: Diagnosis not present

## 2015-07-29 DIAGNOSIS — R002 Palpitations: Secondary | ICD-10-CM | POA: Diagnosis not present

## 2015-07-29 DIAGNOSIS — E782 Mixed hyperlipidemia: Secondary | ICD-10-CM | POA: Diagnosis not present

## 2015-07-29 DIAGNOSIS — G43919 Migraine, unspecified, intractable, without status migrainosus: Secondary | ICD-10-CM | POA: Diagnosis not present

## 2015-09-25 DIAGNOSIS — H6991 Unspecified Eustachian tube disorder, right ear: Secondary | ICD-10-CM | POA: Diagnosis not present

## 2015-09-25 DIAGNOSIS — R0981 Nasal congestion: Secondary | ICD-10-CM | POA: Diagnosis not present

## 2015-09-25 DIAGNOSIS — H9201 Otalgia, right ear: Secondary | ICD-10-CM | POA: Diagnosis not present

## 2015-09-27 DIAGNOSIS — E559 Vitamin D deficiency, unspecified: Secondary | ICD-10-CM | POA: Diagnosis not present

## 2015-09-27 DIAGNOSIS — E7800 Pure hypercholesterolemia, unspecified: Secondary | ICD-10-CM | POA: Diagnosis not present

## 2015-09-27 DIAGNOSIS — Z720 Tobacco use: Secondary | ICD-10-CM | POA: Diagnosis not present

## 2015-09-27 DIAGNOSIS — E039 Hypothyroidism, unspecified: Secondary | ICD-10-CM | POA: Diagnosis not present

## 2015-09-27 DIAGNOSIS — E782 Mixed hyperlipidemia: Secondary | ICD-10-CM | POA: Diagnosis not present

## 2015-09-30 DIAGNOSIS — E782 Mixed hyperlipidemia: Secondary | ICD-10-CM | POA: Diagnosis not present

## 2015-09-30 DIAGNOSIS — E039 Hypothyroidism, unspecified: Secondary | ICD-10-CM | POA: Diagnosis not present

## 2015-12-20 DIAGNOSIS — J209 Acute bronchitis, unspecified: Secondary | ICD-10-CM | POA: Diagnosis not present

## 2015-12-20 DIAGNOSIS — Z720 Tobacco use: Secondary | ICD-10-CM | POA: Diagnosis not present

## 2015-12-20 DIAGNOSIS — E039 Hypothyroidism, unspecified: Secondary | ICD-10-CM | POA: Diagnosis not present

## 2015-12-20 DIAGNOSIS — M7061 Trochanteric bursitis, right hip: Secondary | ICD-10-CM | POA: Diagnosis not present

## 2016-01-10 DIAGNOSIS — H5411 Blindness, right eye, low vision left eye: Secondary | ICD-10-CM | POA: Diagnosis not present

## 2016-01-10 DIAGNOSIS — H3550 Unspecified hereditary retinal dystrophy: Secondary | ICD-10-CM | POA: Diagnosis not present

## 2016-01-10 DIAGNOSIS — H53413 Scotoma involving central area, bilateral: Secondary | ICD-10-CM | POA: Diagnosis not present

## 2016-01-24 DIAGNOSIS — E7800 Pure hypercholesterolemia, unspecified: Secondary | ICD-10-CM | POA: Diagnosis not present

## 2016-01-24 DIAGNOSIS — E039 Hypothyroidism, unspecified: Secondary | ICD-10-CM | POA: Diagnosis not present

## 2016-01-24 DIAGNOSIS — E559 Vitamin D deficiency, unspecified: Secondary | ICD-10-CM | POA: Diagnosis not present

## 2016-01-24 DIAGNOSIS — E782 Mixed hyperlipidemia: Secondary | ICD-10-CM | POA: Diagnosis not present

## 2016-01-24 DIAGNOSIS — R5383 Other fatigue: Secondary | ICD-10-CM | POA: Diagnosis not present

## 2016-01-26 DIAGNOSIS — E039 Hypothyroidism, unspecified: Secondary | ICD-10-CM | POA: Diagnosis not present

## 2016-01-26 DIAGNOSIS — R002 Palpitations: Secondary | ICD-10-CM | POA: Diagnosis not present

## 2016-01-26 DIAGNOSIS — E782 Mixed hyperlipidemia: Secondary | ICD-10-CM | POA: Diagnosis not present

## 2016-01-26 DIAGNOSIS — G43919 Migraine, unspecified, intractable, without status migrainosus: Secondary | ICD-10-CM | POA: Diagnosis not present

## 2016-01-26 DIAGNOSIS — R5383 Other fatigue: Secondary | ICD-10-CM | POA: Diagnosis not present

## 2016-04-03 DIAGNOSIS — M5431 Sciatica, right side: Secondary | ICD-10-CM | POA: Diagnosis not present

## 2016-04-03 DIAGNOSIS — Z01419 Encounter for gynecological examination (general) (routine) without abnormal findings: Secondary | ICD-10-CM | POA: Diagnosis not present

## 2016-04-03 DIAGNOSIS — Z6825 Body mass index (BMI) 25.0-25.9, adult: Secondary | ICD-10-CM | POA: Diagnosis not present

## 2016-04-13 DIAGNOSIS — M4307 Spondylolysis, lumbosacral region: Secondary | ICD-10-CM | POA: Diagnosis not present

## 2016-04-13 DIAGNOSIS — M47817 Spondylosis without myelopathy or radiculopathy, lumbosacral region: Secondary | ICD-10-CM | POA: Diagnosis not present

## 2016-04-13 DIAGNOSIS — M4316 Spondylolisthesis, lumbar region: Secondary | ICD-10-CM | POA: Diagnosis not present

## 2016-04-13 DIAGNOSIS — M5431 Sciatica, right side: Secondary | ICD-10-CM | POA: Diagnosis not present

## 2016-04-13 DIAGNOSIS — M4304 Spondylolysis, thoracic region: Secondary | ICD-10-CM | POA: Diagnosis not present

## 2016-04-13 DIAGNOSIS — M5126 Other intervertebral disc displacement, lumbar region: Secondary | ICD-10-CM | POA: Diagnosis not present

## 2016-04-13 DIAGNOSIS — M9973 Connective tissue and disc stenosis of intervertebral foramina of lumbar region: Secondary | ICD-10-CM | POA: Diagnosis not present

## 2016-04-18 DIAGNOSIS — M5431 Sciatica, right side: Secondary | ICD-10-CM | POA: Diagnosis not present

## 2016-04-20 DIAGNOSIS — M549 Dorsalgia, unspecified: Secondary | ICD-10-CM | POA: Diagnosis not present

## 2016-04-20 DIAGNOSIS — M5416 Radiculopathy, lumbar region: Secondary | ICD-10-CM | POA: Diagnosis not present

## 2016-04-27 DIAGNOSIS — M5416 Radiculopathy, lumbar region: Secondary | ICD-10-CM | POA: Diagnosis not present

## 2016-05-15 DIAGNOSIS — S80861A Insect bite (nonvenomous), right lower leg, initial encounter: Secondary | ICD-10-CM | POA: Diagnosis not present

## 2016-05-15 DIAGNOSIS — S40861A Insect bite (nonvenomous) of right upper arm, initial encounter: Secondary | ICD-10-CM | POA: Diagnosis not present

## 2016-05-15 DIAGNOSIS — S80862A Insect bite (nonvenomous), left lower leg, initial encounter: Secondary | ICD-10-CM | POA: Diagnosis not present

## 2016-05-15 DIAGNOSIS — S40862A Insect bite (nonvenomous) of left upper arm, initial encounter: Secondary | ICD-10-CM | POA: Diagnosis not present

## 2016-05-15 DIAGNOSIS — H81319 Aural vertigo, unspecified ear: Secondary | ICD-10-CM | POA: Diagnosis not present

## 2016-05-18 DIAGNOSIS — M5416 Radiculopathy, lumbar region: Secondary | ICD-10-CM | POA: Diagnosis not present

## 2016-05-18 DIAGNOSIS — M549 Dorsalgia, unspecified: Secondary | ICD-10-CM | POA: Diagnosis not present

## 2016-05-24 DIAGNOSIS — R5383 Other fatigue: Secondary | ICD-10-CM | POA: Diagnosis not present

## 2016-05-24 DIAGNOSIS — E039 Hypothyroidism, unspecified: Secondary | ICD-10-CM | POA: Diagnosis not present

## 2016-05-24 DIAGNOSIS — E782 Mixed hyperlipidemia: Secondary | ICD-10-CM | POA: Diagnosis not present

## 2016-05-24 DIAGNOSIS — E559 Vitamin D deficiency, unspecified: Secondary | ICD-10-CM | POA: Diagnosis not present

## 2016-05-26 DIAGNOSIS — E039 Hypothyroidism, unspecified: Secondary | ICD-10-CM | POA: Diagnosis not present

## 2016-05-26 DIAGNOSIS — R5383 Other fatigue: Secondary | ICD-10-CM | POA: Diagnosis not present

## 2016-05-26 DIAGNOSIS — R002 Palpitations: Secondary | ICD-10-CM | POA: Diagnosis not present

## 2016-05-26 DIAGNOSIS — E782 Mixed hyperlipidemia: Secondary | ICD-10-CM | POA: Diagnosis not present

## 2016-05-26 DIAGNOSIS — G43919 Migraine, unspecified, intractable, without status migrainosus: Secondary | ICD-10-CM | POA: Diagnosis not present

## 2016-06-07 DIAGNOSIS — J209 Acute bronchitis, unspecified: Secondary | ICD-10-CM | POA: Diagnosis not present

## 2016-06-07 DIAGNOSIS — Z720 Tobacco use: Secondary | ICD-10-CM | POA: Diagnosis not present

## 2016-06-16 DIAGNOSIS — M542 Cervicalgia: Secondary | ICD-10-CM | POA: Diagnosis not present

## 2016-06-16 DIAGNOSIS — M5412 Radiculopathy, cervical region: Secondary | ICD-10-CM | POA: Diagnosis not present

## 2016-07-06 DIAGNOSIS — E039 Hypothyroidism, unspecified: Secondary | ICD-10-CM | POA: Diagnosis not present

## 2016-08-16 DIAGNOSIS — R200 Anesthesia of skin: Secondary | ICD-10-CM | POA: Diagnosis not present

## 2016-08-16 DIAGNOSIS — R202 Paresthesia of skin: Secondary | ICD-10-CM | POA: Diagnosis not present

## 2016-08-16 DIAGNOSIS — M4802 Spinal stenosis, cervical region: Secondary | ICD-10-CM | POA: Diagnosis not present

## 2016-08-16 DIAGNOSIS — M47812 Spondylosis without myelopathy or radiculopathy, cervical region: Secondary | ICD-10-CM | POA: Diagnosis not present

## 2016-08-16 DIAGNOSIS — M50222 Other cervical disc displacement at C5-C6 level: Secondary | ICD-10-CM | POA: Diagnosis not present

## 2016-08-16 DIAGNOSIS — M9981 Other biomechanical lesions of cervical region: Secondary | ICD-10-CM | POA: Diagnosis not present

## 2016-08-24 DIAGNOSIS — M5412 Radiculopathy, cervical region: Secondary | ICD-10-CM | POA: Diagnosis not present

## 2016-08-24 DIAGNOSIS — M5416 Radiculopathy, lumbar region: Secondary | ICD-10-CM | POA: Diagnosis not present

## 2016-08-24 DIAGNOSIS — M5023 Other cervical disc displacement, cervicothoracic region: Secondary | ICD-10-CM | POA: Diagnosis not present

## 2016-09-05 DIAGNOSIS — M542 Cervicalgia: Secondary | ICD-10-CM | POA: Diagnosis not present

## 2016-09-13 DIAGNOSIS — M542 Cervicalgia: Secondary | ICD-10-CM | POA: Diagnosis not present

## 2016-09-14 DIAGNOSIS — M5416 Radiculopathy, lumbar region: Secondary | ICD-10-CM | POA: Diagnosis not present

## 2016-09-15 DIAGNOSIS — M542 Cervicalgia: Secondary | ICD-10-CM | POA: Diagnosis not present

## 2016-09-18 DIAGNOSIS — M542 Cervicalgia: Secondary | ICD-10-CM | POA: Diagnosis not present

## 2016-09-20 DIAGNOSIS — M542 Cervicalgia: Secondary | ICD-10-CM | POA: Diagnosis not present

## 2016-09-21 DIAGNOSIS — R002 Palpitations: Secondary | ICD-10-CM | POA: Diagnosis not present

## 2016-09-25 DIAGNOSIS — M542 Cervicalgia: Secondary | ICD-10-CM | POA: Diagnosis not present

## 2016-09-27 DIAGNOSIS — M542 Cervicalgia: Secondary | ICD-10-CM | POA: Diagnosis not present

## 2016-10-02 DIAGNOSIS — M542 Cervicalgia: Secondary | ICD-10-CM | POA: Diagnosis not present

## 2016-10-02 DIAGNOSIS — H6501 Acute serous otitis media, right ear: Secondary | ICD-10-CM | POA: Diagnosis not present

## 2016-10-02 DIAGNOSIS — Z6824 Body mass index (BMI) 24.0-24.9, adult: Secondary | ICD-10-CM | POA: Diagnosis not present

## 2016-10-04 DIAGNOSIS — Z720 Tobacco use: Secondary | ICD-10-CM | POA: Diagnosis not present

## 2016-10-04 DIAGNOSIS — E782 Mixed hyperlipidemia: Secondary | ICD-10-CM | POA: Diagnosis not present

## 2016-10-04 DIAGNOSIS — E039 Hypothyroidism, unspecified: Secondary | ICD-10-CM | POA: Diagnosis not present

## 2016-10-04 DIAGNOSIS — E559 Vitamin D deficiency, unspecified: Secondary | ICD-10-CM | POA: Diagnosis not present

## 2016-10-04 DIAGNOSIS — E7800 Pure hypercholesterolemia, unspecified: Secondary | ICD-10-CM | POA: Diagnosis not present

## 2016-10-06 DIAGNOSIS — M542 Cervicalgia: Secondary | ICD-10-CM | POA: Diagnosis not present

## 2016-10-09 DIAGNOSIS — E039 Hypothyroidism, unspecified: Secondary | ICD-10-CM | POA: Diagnosis not present

## 2016-10-09 DIAGNOSIS — E782 Mixed hyperlipidemia: Secondary | ICD-10-CM | POA: Diagnosis not present

## 2016-10-09 DIAGNOSIS — E559 Vitamin D deficiency, unspecified: Secondary | ICD-10-CM | POA: Diagnosis not present

## 2016-10-09 DIAGNOSIS — M545 Low back pain: Secondary | ICD-10-CM | POA: Diagnosis not present

## 2016-10-09 DIAGNOSIS — M542 Cervicalgia: Secondary | ICD-10-CM | POA: Diagnosis not present

## 2016-10-11 DIAGNOSIS — M542 Cervicalgia: Secondary | ICD-10-CM | POA: Diagnosis not present

## 2016-10-12 DIAGNOSIS — R030 Elevated blood-pressure reading, without diagnosis of hypertension: Secondary | ICD-10-CM | POA: Diagnosis not present

## 2016-10-12 DIAGNOSIS — M542 Cervicalgia: Secondary | ICD-10-CM | POA: Diagnosis not present

## 2016-10-12 DIAGNOSIS — M5416 Radiculopathy, lumbar region: Secondary | ICD-10-CM | POA: Diagnosis not present

## 2016-10-16 DIAGNOSIS — M542 Cervicalgia: Secondary | ICD-10-CM | POA: Diagnosis not present

## 2016-10-18 DIAGNOSIS — M542 Cervicalgia: Secondary | ICD-10-CM | POA: Diagnosis not present

## 2016-10-23 DIAGNOSIS — M5023 Other cervical disc displacement, cervicothoracic region: Secondary | ICD-10-CM | POA: Diagnosis not present

## 2016-10-23 DIAGNOSIS — M542 Cervicalgia: Secondary | ICD-10-CM | POA: Diagnosis not present

## 2017-02-01 DIAGNOSIS — M1611 Unilateral primary osteoarthritis, right hip: Secondary | ICD-10-CM | POA: Diagnosis not present

## 2017-02-01 DIAGNOSIS — M25571 Pain in right ankle and joints of right foot: Secondary | ICD-10-CM | POA: Diagnosis not present

## 2017-02-01 DIAGNOSIS — M25521 Pain in right elbow: Secondary | ICD-10-CM | POA: Diagnosis not present

## 2017-02-01 DIAGNOSIS — M25522 Pain in left elbow: Secondary | ICD-10-CM | POA: Diagnosis not present

## 2017-02-01 DIAGNOSIS — M25551 Pain in right hip: Secondary | ICD-10-CM | POA: Diagnosis not present

## 2017-02-01 DIAGNOSIS — M25572 Pain in left ankle and joints of left foot: Secondary | ICD-10-CM | POA: Diagnosis not present

## 2017-02-05 DIAGNOSIS — Z720 Tobacco use: Secondary | ICD-10-CM | POA: Diagnosis not present

## 2017-02-05 DIAGNOSIS — E7800 Pure hypercholesterolemia, unspecified: Secondary | ICD-10-CM | POA: Diagnosis not present

## 2017-02-05 DIAGNOSIS — E039 Hypothyroidism, unspecified: Secondary | ICD-10-CM | POA: Diagnosis not present

## 2017-02-05 DIAGNOSIS — E782 Mixed hyperlipidemia: Secondary | ICD-10-CM | POA: Diagnosis not present

## 2017-02-05 DIAGNOSIS — E559 Vitamin D deficiency, unspecified: Secondary | ICD-10-CM | POA: Diagnosis not present

## 2017-02-08 DIAGNOSIS — Z0000 Encounter for general adult medical examination without abnormal findings: Secondary | ICD-10-CM | POA: Diagnosis not present

## 2017-02-08 DIAGNOSIS — E039 Hypothyroidism, unspecified: Secondary | ICD-10-CM | POA: Diagnosis not present

## 2017-02-08 DIAGNOSIS — E782 Mixed hyperlipidemia: Secondary | ICD-10-CM | POA: Diagnosis not present

## 2017-02-08 DIAGNOSIS — E559 Vitamin D deficiency, unspecified: Secondary | ICD-10-CM | POA: Diagnosis not present

## 2017-02-08 DIAGNOSIS — M542 Cervicalgia: Secondary | ICD-10-CM | POA: Diagnosis not present

## 2017-02-19 DIAGNOSIS — H5213 Myopia, bilateral: Secondary | ICD-10-CM | POA: Diagnosis not present

## 2017-02-19 DIAGNOSIS — H5450 Low vision, one eye, unspecified eye: Secondary | ICD-10-CM | POA: Diagnosis not present

## 2017-02-19 DIAGNOSIS — H5440 Blindness, one eye, unspecified eye: Secondary | ICD-10-CM | POA: Diagnosis not present

## 2017-02-19 DIAGNOSIS — H53413 Scotoma involving central area, bilateral: Secondary | ICD-10-CM | POA: Diagnosis not present

## 2017-03-26 DIAGNOSIS — A084 Viral intestinal infection, unspecified: Secondary | ICD-10-CM | POA: Diagnosis not present

## 2017-03-26 DIAGNOSIS — M659 Synovitis and tenosynovitis, unspecified: Secondary | ICD-10-CM | POA: Diagnosis not present

## 2017-03-26 DIAGNOSIS — Z6823 Body mass index (BMI) 23.0-23.9, adult: Secondary | ICD-10-CM | POA: Diagnosis not present

## 2017-03-28 DIAGNOSIS — M659 Synovitis and tenosynovitis, unspecified: Secondary | ICD-10-CM | POA: Diagnosis not present

## 2017-04-16 ENCOUNTER — Encounter (INDEPENDENT_AMBULATORY_CARE_PROVIDER_SITE_OTHER): Payer: Self-pay | Admitting: Internal Medicine

## 2017-04-19 DIAGNOSIS — M5416 Radiculopathy, lumbar region: Secondary | ICD-10-CM | POA: Diagnosis not present

## 2017-05-14 ENCOUNTER — Encounter (INDEPENDENT_AMBULATORY_CARE_PROVIDER_SITE_OTHER): Payer: Self-pay | Admitting: Internal Medicine

## 2017-05-14 ENCOUNTER — Ambulatory Visit (INDEPENDENT_AMBULATORY_CARE_PROVIDER_SITE_OTHER): Payer: Medicare Other | Admitting: Internal Medicine

## 2017-05-14 VITALS — BP 130/80 | HR 80 | Temp 98.2°F | Ht 64.0 in | Wt 139.2 lb

## 2017-05-14 DIAGNOSIS — R1032 Left lower quadrant pain: Secondary | ICD-10-CM | POA: Diagnosis not present

## 2017-05-14 DIAGNOSIS — R195 Other fecal abnormalities: Secondary | ICD-10-CM | POA: Diagnosis not present

## 2017-05-14 LAB — CBC WITH DIFFERENTIAL/PLATELET
BASOS PCT: 0.5 %
Basophils Absolute: 67 cells/uL (ref 0–200)
EOS ABS: 80 {cells}/uL (ref 15–500)
Eosinophils Relative: 0.6 %
HCT: 45 % (ref 35.0–45.0)
HEMOGLOBIN: 15.6 g/dL — AB (ref 11.7–15.5)
LYMPHS ABS: 3404 {cells}/uL (ref 850–3900)
MCH: 31.1 pg (ref 27.0–33.0)
MCHC: 34.7 g/dL (ref 32.0–36.0)
MCV: 89.8 fL (ref 80.0–100.0)
MPV: 10.1 fL (ref 7.5–12.5)
Monocytes Relative: 5.5 %
NEUTROS ABS: 9112 {cells}/uL — AB (ref 1500–7800)
Neutrophils Relative %: 68 %
PLATELETS: 330 10*3/uL (ref 140–400)
RBC: 5.01 10*6/uL (ref 3.80–5.10)
RDW: 12.5 % (ref 11.0–15.0)
TOTAL LYMPHOCYTE: 25.4 %
WBC: 13.4 10*3/uL — AB (ref 3.8–10.8)
WBCMIX: 737 {cells}/uL (ref 200–950)

## 2017-05-14 MED ORDER — DICYCLOMINE HCL 10 MG PO CAPS: 10.0000 mg | ORAL_CAPSULE | Freq: Two times a day (BID) | ORAL | 3 refills | Status: DC

## 2017-05-14 NOTE — Progress Notes (Signed)
   Subjective:    Patient ID: [name] [name], female    DOB: 06/29/1971, 46 y.o.   MRN: [id number]  HPI Referred by Dr. Quintin Alto for soft stools. She says stools are mushy, soft, and sometimes they are like watery. Sometimes she see mucous.  She has pain LLQ x 3 months.  She states she rarely has a formed stools. Sometimes she has 4-5 stools.  Usually she will have a BM about 30 minutes after eating.  She has lost from 142 to 139.5. Her appetite is not good.  She says she does not feel hungry till the evening. There has been no melena or BRRB.  No family hx of colon cancer.                    No recent antibiotics.    February of 2009 Delfin Edis)  Colonoscopy: diarrhea, increased frequency of stools.  Normal Exam.   05/22/2008 Sigmoidoscopy:  Abnormal CT scan, (Do not suspect malignancy) Normal appearing sigmoid  mucosa. No evidence of co COLON, LEFT, BIOPSIES: - FRAGMENTS OF BENIGN COLONIC MUCOSA WITH FOCAL REACTIVE LYMPHOID AGGREGATE. - NO ACTIVE MUCOSAL INFLAMMATION, GRANULOMAS, OR DYSPLASIA IDENTIFIED.litis.      02/06/2017 total bili 0.2, ALP 61, AST 24, ALT 32.   Review of Systems    Past Medical History:  Diagnosis Date  . Anxiety   . Graves disease   . High cholesterol   . Macular degeneration, dry   . Migraine     Past Surgical History:  Procedure Laterality Date  . APPENDECTOMY  08-04-2004  . OTHER SURGICAL HISTORY  12-13-2006   fatty tumor removed  . PARTIAL HYSTERECTOMY  08-04-2004    Allergies  Allergen Reactions  . Prednisone     Current Outpatient Prescriptions on File Prior to Visit  Medication Sig Dispense Refill  . ALPRAZolam (XANAX) 0.5 MG tablet Take 0.5 mg by mouth at bedtime as needed for anxiety.    Marland Kitchen levothyroxine (SYNTHROID, LEVOTHROID) 100 MCG tablet Take 112 mcg by mouth daily before breakfast.     . naproxen (NAPROSYN) 500 MG tablet Take 2 tablets by mouth daily.     No current facility-administered medications on file prior  to visit.         Objective:   Physical Exam Blood pressure 130/80, pulse 80, temperature 98.2 F (36.8 C), height 5\' 4"  (1.626 m), weight 139 lb 3.2 oz (63.1 kg). Alert and oriented. Skin warm and dry. Oral mucosa is moist.   . Sclera anicteric, conjunctivae is pink. Thyroid not enlarged. No cervical lymphadenopathy. Lungs clear. Heart regular rate and rhythm.  Abdomen is soft. Bowel sounds are positive. No hepatomegaly. No abdominal masses felt. No tenderness.  No edema to lower extremities.  Stool negative for blood. No rectal masses felt.         Assessment & Plan:  LLQ pain. ? Etiology.  Change in stools, Possible IBS. CT abdomen/pelvis with CM. Dicyclomine 10mg  BID  CBC.

## 2017-05-14 NOTE — Patient Instructions (Signed)
CBC.  CT abdomen/pelvis with CM. Rx for Dicyclomine 10mg  BID sent to her pharmacy.

## 2017-05-23 DIAGNOSIS — M5416 Radiculopathy, lumbar region: Secondary | ICD-10-CM | POA: Diagnosis not present

## 2017-05-23 DIAGNOSIS — M5023 Other cervical disc displacement, cervicothoracic region: Secondary | ICD-10-CM | POA: Diagnosis not present

## 2017-05-23 DIAGNOSIS — R030 Elevated blood-pressure reading, without diagnosis of hypertension: Secondary | ICD-10-CM | POA: Diagnosis not present

## 2017-05-24 ENCOUNTER — Ambulatory Visit (HOSPITAL_COMMUNITY)
Admission: RE | Admit: 2017-05-24 | Discharge: 2017-05-24 | Disposition: A | Discharge status: Home / Self Care | Payer: Medicare Other | Source: Ambulatory Visit | Attending: Internal Medicine | Admitting: Internal Medicine

## 2017-05-24 DIAGNOSIS — K760 Fatty (change of) liver, not elsewhere classified: Secondary | ICD-10-CM | POA: Diagnosis not present

## 2017-05-24 DIAGNOSIS — R1032 Left lower quadrant pain: Secondary | ICD-10-CM | POA: Insufficient documentation

## 2017-05-24 DIAGNOSIS — R197 Diarrhea, unspecified: Secondary | ICD-10-CM | POA: Diagnosis not present

## 2017-05-24 MED ORDER — IOPAMIDOL (ISOVUE-300) INJECTION 61%
100.0000 mL | Freq: Once | INTRAVENOUS | Status: AC | PRN
  Administered 2017-05-24: 100 mL via INTRAVENOUS

## 2017-05-25 ENCOUNTER — Encounter (INDEPENDENT_AMBULATORY_CARE_PROVIDER_SITE_OTHER): Payer: Self-pay | Admitting: Internal Medicine

## 2017-05-28 ENCOUNTER — Other Ambulatory Visit (INDEPENDENT_AMBULATORY_CARE_PROVIDER_SITE_OTHER): Payer: Self-pay | Admitting: Internal Medicine

## 2017-05-28 ENCOUNTER — Telehealth (INDEPENDENT_AMBULATORY_CARE_PROVIDER_SITE_OTHER): Payer: Self-pay | Admitting: Internal Medicine

## 2017-05-28 DIAGNOSIS — R197 Diarrhea, unspecified: Secondary | ICD-10-CM

## 2017-05-28 NOTE — Telephone Encounter (Signed)
GI pathogen ordered 

## 2017-05-29 DIAGNOSIS — R197 Diarrhea, unspecified: Secondary | ICD-10-CM | POA: Diagnosis not present

## 2017-05-31 LAB — GASTROINTESTINAL PATHOGEN PANEL PCR
C. difficile Tox A/B, PCR: NOT DETECTED
Campylobacter, PCR: NOT DETECTED
Cryptosporidium, PCR: NOT DETECTED
E COLI (ETEC) LT/ST, PCR: NOT DETECTED
E COLI (STEC) STX1/STX2, PCR: NOT DETECTED
E coli 0157, PCR: NOT DETECTED
Giardia lamblia, PCR: NOT DETECTED
Norovirus, PCR: NOT DETECTED
ROTAVIRUS, PCR: NOT DETECTED
SALMONELLA, PCR: NOT DETECTED
Shigella, PCR: NOT DETECTED

## 2017-06-12 DIAGNOSIS — E7800 Pure hypercholesterolemia, unspecified: Secondary | ICD-10-CM | POA: Diagnosis not present

## 2017-06-12 DIAGNOSIS — E039 Hypothyroidism, unspecified: Secondary | ICD-10-CM | POA: Diagnosis not present

## 2017-06-12 DIAGNOSIS — E559 Vitamin D deficiency, unspecified: Secondary | ICD-10-CM | POA: Diagnosis not present

## 2017-06-12 DIAGNOSIS — Z720 Tobacco use: Secondary | ICD-10-CM | POA: Diagnosis not present

## 2017-06-12 DIAGNOSIS — E782 Mixed hyperlipidemia: Secondary | ICD-10-CM | POA: Diagnosis not present

## 2017-06-15 DIAGNOSIS — E559 Vitamin D deficiency, unspecified: Secondary | ICD-10-CM | POA: Diagnosis not present

## 2017-06-15 DIAGNOSIS — Z23 Encounter for immunization: Secondary | ICD-10-CM | POA: Diagnosis not present

## 2017-06-15 DIAGNOSIS — M545 Low back pain: Secondary | ICD-10-CM | POA: Diagnosis not present

## 2017-06-15 DIAGNOSIS — E039 Hypothyroidism, unspecified: Secondary | ICD-10-CM | POA: Diagnosis not present

## 2017-06-15 DIAGNOSIS — E782 Mixed hyperlipidemia: Secondary | ICD-10-CM | POA: Diagnosis not present

## 2017-06-15 DIAGNOSIS — M542 Cervicalgia: Secondary | ICD-10-CM | POA: Diagnosis not present

## 2017-07-03 DIAGNOSIS — M5440 Lumbago with sciatica, unspecified side: Secondary | ICD-10-CM | POA: Diagnosis not present

## 2017-07-04 ENCOUNTER — Other Ambulatory Visit (HOSPITAL_COMMUNITY): Payer: Self-pay | Admitting: Neurosurgery

## 2017-07-04 DIAGNOSIS — M5440 Lumbago with sciatica, unspecified side: Secondary | ICD-10-CM

## 2017-07-10 ENCOUNTER — Ambulatory Visit (HOSPITAL_COMMUNITY)
Admission: RE | Admit: 2017-07-10 | Discharge: 2017-07-10 | Disposition: A | Discharge status: Home / Self Care | Payer: Medicare Other | Source: Ambulatory Visit | Attending: Neurosurgery | Admitting: Neurosurgery

## 2017-07-10 DIAGNOSIS — M4686 Other specified inflammatory spondylopathies, lumbar region: Secondary | ICD-10-CM | POA: Diagnosis not present

## 2017-07-10 DIAGNOSIS — M5440 Lumbago with sciatica, unspecified side: Secondary | ICD-10-CM | POA: Diagnosis not present

## 2017-07-10 DIAGNOSIS — M48061 Spinal stenosis, lumbar region without neurogenic claudication: Secondary | ICD-10-CM | POA: Diagnosis not present

## 2017-07-10 DIAGNOSIS — M545 Low back pain: Secondary | ICD-10-CM | POA: Diagnosis not present

## 2017-07-12 DIAGNOSIS — M5416 Radiculopathy, lumbar region: Secondary | ICD-10-CM | POA: Diagnosis not present

## 2017-07-12 DIAGNOSIS — M5126 Other intervertebral disc displacement, lumbar region: Secondary | ICD-10-CM | POA: Diagnosis not present

## 2017-07-25 DIAGNOSIS — J0101 Acute recurrent maxillary sinusitis: Secondary | ICD-10-CM | POA: Diagnosis not present

## 2017-07-25 DIAGNOSIS — Z6822 Body mass index (BMI) 22.0-22.9, adult: Secondary | ICD-10-CM | POA: Diagnosis not present

## 2017-08-11 DIAGNOSIS — J0101 Acute recurrent maxillary sinusitis: Secondary | ICD-10-CM | POA: Diagnosis not present

## 2017-08-11 DIAGNOSIS — Z6822 Body mass index (BMI) 22.0-22.9, adult: Secondary | ICD-10-CM | POA: Diagnosis not present

## 2017-08-11 DIAGNOSIS — H6502 Acute serous otitis media, left ear: Secondary | ICD-10-CM | POA: Diagnosis not present

## 2017-08-13 DIAGNOSIS — M545 Low back pain: Secondary | ICD-10-CM | POA: Diagnosis not present

## 2017-08-13 DIAGNOSIS — M5137 Other intervertebral disc degeneration, lumbosacral region: Secondary | ICD-10-CM | POA: Diagnosis not present

## 2017-08-14 DIAGNOSIS — R030 Elevated blood-pressure reading, without diagnosis of hypertension: Secondary | ICD-10-CM | POA: Diagnosis not present

## 2017-08-14 DIAGNOSIS — M5440 Lumbago with sciatica, unspecified side: Secondary | ICD-10-CM | POA: Diagnosis not present

## 2017-08-14 DIAGNOSIS — M5416 Radiculopathy, lumbar region: Secondary | ICD-10-CM | POA: Diagnosis not present

## 2017-08-30 DIAGNOSIS — R928 Other abnormal and inconclusive findings on diagnostic imaging of breast: Secondary | ICD-10-CM | POA: Diagnosis not present

## 2017-08-30 DIAGNOSIS — Z1231 Encounter for screening mammogram for malignant neoplasm of breast: Secondary | ICD-10-CM | POA: Diagnosis not present

## 2017-09-12 DIAGNOSIS — R922 Inconclusive mammogram: Secondary | ICD-10-CM | POA: Diagnosis not present

## 2017-09-12 DIAGNOSIS — N6001 Solitary cyst of right breast: Secondary | ICD-10-CM | POA: Diagnosis not present

## 2017-09-12 DIAGNOSIS — N6011 Diffuse cystic mastopathy of right breast: Secondary | ICD-10-CM | POA: Diagnosis not present

## 2017-09-27 DIAGNOSIS — G43919 Migraine, unspecified, intractable, without status migrainosus: Secondary | ICD-10-CM | POA: Diagnosis not present

## 2017-09-27 DIAGNOSIS — E782 Mixed hyperlipidemia: Secondary | ICD-10-CM | POA: Diagnosis not present

## 2017-09-27 DIAGNOSIS — R002 Palpitations: Secondary | ICD-10-CM | POA: Diagnosis not present

## 2017-09-27 DIAGNOSIS — H6501 Acute serous otitis media, right ear: Secondary | ICD-10-CM | POA: Diagnosis not present

## 2017-09-27 DIAGNOSIS — E039 Hypothyroidism, unspecified: Secondary | ICD-10-CM | POA: Diagnosis not present

## 2017-10-03 DIAGNOSIS — M545 Low back pain: Secondary | ICD-10-CM | POA: Diagnosis not present

## 2017-10-03 DIAGNOSIS — R002 Palpitations: Secondary | ICD-10-CM | POA: Diagnosis not present

## 2017-10-03 DIAGNOSIS — E782 Mixed hyperlipidemia: Secondary | ICD-10-CM | POA: Diagnosis not present

## 2017-10-03 DIAGNOSIS — M542 Cervicalgia: Secondary | ICD-10-CM | POA: Diagnosis not present

## 2017-10-03 DIAGNOSIS — E559 Vitamin D deficiency, unspecified: Secondary | ICD-10-CM | POA: Diagnosis not present

## 2017-10-03 DIAGNOSIS — E039 Hypothyroidism, unspecified: Secondary | ICD-10-CM | POA: Diagnosis not present

## 2017-10-18 DIAGNOSIS — M47816 Spondylosis without myelopathy or radiculopathy, lumbar region: Secondary | ICD-10-CM | POA: Diagnosis not present

## 2017-10-18 DIAGNOSIS — M4316 Spondylolisthesis, lumbar region: Secondary | ICD-10-CM | POA: Diagnosis not present

## 2017-10-18 DIAGNOSIS — M48061 Spinal stenosis, lumbar region without neurogenic claudication: Secondary | ICD-10-CM | POA: Diagnosis not present

## 2017-11-12 DIAGNOSIS — M5416 Radiculopathy, lumbar region: Secondary | ICD-10-CM | POA: Diagnosis not present

## 2017-11-26 DIAGNOSIS — S80862A Insect bite (nonvenomous), left lower leg, initial encounter: Secondary | ICD-10-CM | POA: Diagnosis not present

## 2017-11-26 DIAGNOSIS — Z6823 Body mass index (BMI) 23.0-23.9, adult: Secondary | ICD-10-CM | POA: Diagnosis not present

## 2017-11-29 DIAGNOSIS — E559 Vitamin D deficiency, unspecified: Secondary | ICD-10-CM | POA: Diagnosis not present

## 2017-11-29 DIAGNOSIS — E7800 Pure hypercholesterolemia, unspecified: Secondary | ICD-10-CM | POA: Diagnosis not present

## 2017-11-29 DIAGNOSIS — Z720 Tobacco use: Secondary | ICD-10-CM | POA: Diagnosis not present

## 2017-11-29 DIAGNOSIS — R5383 Other fatigue: Secondary | ICD-10-CM | POA: Diagnosis not present

## 2017-12-03 DIAGNOSIS — E559 Vitamin D deficiency, unspecified: Secondary | ICD-10-CM | POA: Diagnosis not present

## 2017-12-03 DIAGNOSIS — M545 Low back pain: Secondary | ICD-10-CM | POA: Diagnosis not present

## 2017-12-03 DIAGNOSIS — M542 Cervicalgia: Secondary | ICD-10-CM | POA: Diagnosis not present

## 2017-12-03 DIAGNOSIS — E039 Hypothyroidism, unspecified: Secondary | ICD-10-CM | POA: Diagnosis not present

## 2017-12-03 DIAGNOSIS — E782 Mixed hyperlipidemia: Secondary | ICD-10-CM | POA: Diagnosis not present

## 2017-12-25 DIAGNOSIS — M5440 Lumbago with sciatica, unspecified side: Secondary | ICD-10-CM | POA: Diagnosis not present

## 2017-12-25 DIAGNOSIS — M5416 Radiculopathy, lumbar region: Secondary | ICD-10-CM | POA: Diagnosis not present

## 2017-12-25 DIAGNOSIS — R030 Elevated blood-pressure reading, without diagnosis of hypertension: Secondary | ICD-10-CM | POA: Diagnosis not present

## 2017-12-28 DIAGNOSIS — R300 Dysuria: Secondary | ICD-10-CM | POA: Diagnosis not present

## 2018-01-04 DIAGNOSIS — Z6824 Body mass index (BMI) 24.0-24.9, adult: Secondary | ICD-10-CM | POA: Diagnosis not present

## 2018-01-04 DIAGNOSIS — G43919 Migraine, unspecified, intractable, without status migrainosus: Secondary | ICD-10-CM | POA: Diagnosis not present

## 2018-01-04 DIAGNOSIS — R51 Headache: Secondary | ICD-10-CM | POA: Diagnosis not present

## 2018-01-09 DIAGNOSIS — R42 Dizziness and giddiness: Secondary | ICD-10-CM | POA: Diagnosis not present

## 2018-01-09 DIAGNOSIS — R9089 Other abnormal findings on diagnostic imaging of central nervous system: Secondary | ICD-10-CM | POA: Diagnosis not present

## 2018-01-09 DIAGNOSIS — G43909 Migraine, unspecified, not intractable, without status migrainosus: Secondary | ICD-10-CM | POA: Diagnosis not present

## 2018-01-09 DIAGNOSIS — R51 Headache: Secondary | ICD-10-CM | POA: Diagnosis not present

## 2018-01-11 DIAGNOSIS — Z6824 Body mass index (BMI) 24.0-24.9, adult: Secondary | ICD-10-CM | POA: Diagnosis not present

## 2018-01-11 DIAGNOSIS — G43019 Migraine without aura, intractable, without status migrainosus: Secondary | ICD-10-CM | POA: Diagnosis not present

## 2018-01-15 ENCOUNTER — Telehealth: Payer: Self-pay | Admitting: Diagnostic Neuroimaging

## 2018-01-15 DIAGNOSIS — E039 Hypothyroidism, unspecified: Secondary | ICD-10-CM | POA: Diagnosis not present

## 2018-01-15 DIAGNOSIS — J019 Acute sinusitis, unspecified: Secondary | ICD-10-CM | POA: Diagnosis not present

## 2018-01-15 DIAGNOSIS — Z6824 Body mass index (BMI) 24.0-24.9, adult: Secondary | ICD-10-CM | POA: Diagnosis not present

## 2018-01-15 NOTE — Telephone Encounter (Signed)
That's fine. thanks

## 2018-01-15 NOTE — Telephone Encounter (Signed)
Agree. -VRP 

## 2018-01-15 NOTE — Telephone Encounter (Signed)
We received a referral on this pt for headaches. She has seen Dr. Leta Baptist in the past (back in 2015), but is requesting to see Dr. Jaynee Eagles for this. Are you both ok with this?

## 2018-01-24 DIAGNOSIS — W57XXXA Bitten or stung by nonvenomous insect and other nonvenomous arthropods, initial encounter: Secondary | ICD-10-CM | POA: Diagnosis not present

## 2018-01-24 DIAGNOSIS — L040 Acute lymphadenitis of face, head and neck: Secondary | ICD-10-CM | POA: Diagnosis not present

## 2018-01-24 DIAGNOSIS — Z6823 Body mass index (BMI) 23.0-23.9, adult: Secondary | ICD-10-CM | POA: Diagnosis not present

## 2018-01-26 DIAGNOSIS — B029 Zoster without complications: Secondary | ICD-10-CM | POA: Diagnosis not present

## 2018-02-28 DIAGNOSIS — Z6823 Body mass index (BMI) 23.0-23.9, adult: Secondary | ICD-10-CM | POA: Diagnosis not present

## 2018-02-28 DIAGNOSIS — G43519 Persistent migraine aura without cerebral infarction, intractable, without status migrainosus: Secondary | ICD-10-CM | POA: Diagnosis not present

## 2018-03-06 ENCOUNTER — Ambulatory Visit: Payer: Medicare Other | Admitting: Neurology

## 2018-03-06 ENCOUNTER — Encounter

## 2018-03-06 ENCOUNTER — Encounter: Payer: Self-pay | Admitting: Neurology

## 2018-03-06 VITALS — BP 142/88 | HR 85 | Ht 64.0 in | Wt 145.0 lb

## 2018-03-06 DIAGNOSIS — G43711 Chronic migraine without aura, intractable, with status migrainosus: Secondary | ICD-10-CM

## 2018-03-06 DIAGNOSIS — G43709 Chronic migraine without aura, not intractable, without status migrainosus: Secondary | ICD-10-CM | POA: Diagnosis not present

## 2018-03-06 MED ORDER — ERENUMAB-AOOE 140 MG/ML ~~LOC~~ SOAJ: 140.0000 mg | SUBCUTANEOUS | 11 refills | Status: DC

## 2018-03-06 MED ORDER — ONDANSETRON 4 MG PO TBDP: 4.0000 mg | ORAL_TABLET | Freq: Three times a day (TID) | ORAL | 11 refills | Status: DC | PRN

## 2018-03-06 MED ORDER — KETOROLAC TROMETHAMINE 60 MG/2ML IM SOLN
60.0000 mg | Freq: Once | INTRAMUSCULAR | Status: AC
  Administered 2018-03-06: 60 mg via INTRAMUSCULAR

## 2018-03-06 MED ORDER — ERENUMAB-AOOE 140 MG/ML ~~LOC~~ SOAJ: 140.0000 mg | SUBCUTANEOUS | 0 refills | Status: DC

## 2018-03-06 MED ORDER — RIZATRIPTAN BENZOATE 10 MG PO TBDP: 10.0000 mg | ORAL_TABLET | ORAL | 11 refills | Status: DC | PRN

## 2018-03-06 NOTE — Progress Notes (Signed)
Toradol 60 mg injection given to pt in RUOQ of buttock. Pt tolerated well. See MAR. Bandaid applied.

## 2018-03-06 NOTE — Patient Instructions (Signed)
Maxalt as needed for migraine, may repeat in 2 hours. Max 2 in 24 hours.  Zofran ODT as needed for nausea/vomiting with migraine Toradol 60 mg shot today in office    Rizatriptan disintegrating tablets What is this medicine? RIZATRIPTAN (rye za TRIP tan) is used to treat migraines with or without aura. An aura is a strange feeling or visual disturbance that warns you of an attack. It is not used to prevent migraines. This medicine may be used for other purposes; ask your health care provider or pharmacist if you have questions. COMMON BRAND NAME(S): Maxalt-MLT What should I tell my health care provider before I take this medicine? They need to know if you have any of these conditions: -bowel disease or colitis -diabetes -family history of heart disease -fast or irregular heart beat -heart or blood vessel disease, angina (chest pain), or previous heart attack -high blood pressure -high cholesterol -history of stroke, transient ischemic attacks (TIAs or mini-strokes), or intracranial bleeding -kidney or liver disease -overweight -poor circulation -postmenopausal or surgical removal of uterus and ovaries -an unusual or allergic reaction to rizatriptan, other medicines, foods, dyes, or preservatives -pregnant or trying to get pregnant -breast-feeding How should I use this medicine? Take this medicine by mouth. Follow the directions on the prescription label. This medicine is taken at the first symptoms of a migraine. It is not for everyday use. Leave the tablet in the foil package until you are ready to take it. Do not push the tablet through the blister pack. Peel open the blister pack with dry hands and place the tablet on your tongue. The tablet will dissolve rapidly and be swallowed in your saliva. It is not necessary to drink any water to take this medicine. If your migraine headache returns after one dose, you can take another dose as directed. You must leave at least 2 hours between  doses, and do not take more than 30 mg total in 24 hours. If there is no improvement at all after the first dose, do not take a second dose without talking to your doctor or health care professional. Do not take your medicine more often than directed. Talk to your pediatrician regarding the use of this medicine in children. While this drug may be prescribed for children as young as 6 years for selected conditions, precautions do apply. Overdosage: If you think you have taken too much of this medicine contact a poison control center or emergency room at once. NOTE: This medicine is only for you. Do not share this medicine with others. What if I miss a dose? This does not apply; this medicine is not for regular use. What may interact with this medicine? Do not take this medicine with any of the following medicines: -amphetamine, dextroamphetamine or cocaine -dihydroergotamine, ergotamine, ergoloid mesylates, methysergide, or ergot-type medication - do not take within 24 hours of taking rizatriptan -feverfew -MAOIs like Carbex, Eldepryl, Marplan, Nardil, and Parnate - do not take rizatriptan within 2 weeks of stopping MAOI therapy. -other migraine medicines like almotriptan, eletriptan, naratriptan, sumatriptan, zolmitriptan - do not take within 24 hours of taking rizatriptan -tryptophan This medicine may also interact with the following medications: -medicines for mental depression, anxiety or mood problems -propranolol This list may not describe all possible interactions. Give your health care provider a list of all the medicines, herbs, non-prescription drugs, or dietary supplements you use. Also tell them if you smoke, drink alcohol, or use illegal drugs. Some items may interact with your medicine. What  should I watch for while using this medicine? Only take this medicine for a migraine headache. Take it if you get warning symptoms or at the start of a migraine attack. It is not for regular use  to prevent migraine attacks. You may get drowsy or dizzy. Do not drive, use machinery, or do anything that needs mental alertness until you know how this medicine affects you. To reduce dizzy or fainting spells, do not sit or stand up quickly, especially if you are an older patient. Alcohol can increase drowsiness, dizziness and flushing. Avoid alcoholic drinks. Smoking cigarettes may increase the risk of heart-related side effects from using this medicine. If you take migraine medicines for 10 or more days a month, your migraines may get worse. Keep a diary of headache days and medicine use. Contact your healthcare professional if your migraine attacks occur more frequently. What side effects may I notice from receiving this medicine? Side effects that you should report to your doctor or health care professional as soon as possible: -allergic reactions like skin rash, itching or hives, swelling of the face, lips, or tongue -fast, slow, or irregular heart beat -increased or decreased blood pressure -seizures -severe stomach pain and cramping, bloody diarrhea -signs and symptoms of a blood clot such as breathing problems; changes in vision; chest pain; severe, sudden headache; pain, swelling, warmth in the leg; trouble speaking; sudden numbness or weakness of the face, arm or leg -tingling, pain, or numbness in the face, hands, or feet Side effects that usually do not require medical attention (report to your doctor or health care professional if they continue or are bothersome): -drowsiness -dry mouth -feeling warm, flushing, or redness of the face -headache -muscle cramps, pain -nausea, vomiting -unusually weak or tired This list may not describe all possible side effects. Call your doctor for medical advice about side effects. You may report side effects to FDA at 1-800-FDA-1088. Where should I keep my medicine? Keep out of the reach of children. Store at room temperature between 15 and 30  degrees C (59 and 86 degrees F). Protect from light and moisture. Throw away any unused medicine after the expiration date. NOTE: This sheet is a summary. It may not cover all possible information. If you have questions about this medicine, talk to your doctor, pharmacist, or health care provider.  2018 Elsevier/Gold Standard (2013-03-25 10:17:42)  Ondansetron oral dissolving tablet What is this medicine? ONDANSETRON (on DAN se tron) is used to treat nausea and vomiting caused by chemotherapy. It is also used to prevent or treat nausea and vomiting after surgery. This medicine may be used for other purposes; ask your health care provider or pharmacist if you have questions. COMMON BRAND NAME(S): Zofran ODT What should I tell my health care provider before I take this medicine? They need to know if you have any of these conditions: -heart disease -history of irregular heartbeat -liver disease -low levels of magnesium or potassium in the blood -an unusual or allergic reaction to ondansetron, granisetron, other medicines, foods, dyes, or preservatives -pregnant or trying to get pregnant -breast-feeding How should I use this medicine? These tablets are made to dissolve in the mouth. Do not try to push the tablet through the foil backing. With dry hands, peel away the foil backing and gently remove the tablet. Place the tablet in the mouth and allow it to dissolve, then swallow. While you may take these tablets with water, it is not necessary to do so. Talk to your pediatrician  regarding the use of this medicine in children. Special care may be needed. Overdosage: If you think you have taken too much of this medicine contact a poison control center or emergency room at once. NOTE: This medicine is only for you. Do not share this medicine with others. What if I miss a dose? If you miss a dose, take it as soon as you can. If it is almost time for your next dose, take only that dose. Do not take  double or extra doses. What may interact with this medicine? Do not take this medicine with any of the following medications: -apomorphine -certain medicines for fungal infections like fluconazole, itraconazole, ketoconazole, posaconazole, voriconazole -cisapride -dofetilide -dronedarone -pimozide -thioridazine -ziprasidone This medicine may also interact with the following medications: -carbamazepine -certain medicines for depression, anxiety, or psychotic disturbances -fentanyl -linezolid -MAOIs like Carbex, Eldepryl, Marplan, Nardil, and Parnate -methylene blue (injected into a vein) -other medicines that prolong the QT interval (cause an abnormal heart rhythm) -phenytoin -rifampicin -tramadol This list may not describe all possible interactions. Give your health care provider a list of all the medicines, herbs, non-prescription drugs, or dietary supplements you use. Also tell them if you smoke, drink alcohol, or use illegal drugs. Some items may interact with your medicine. What should I watch for while using this medicine? Check with your doctor or health care professional as soon as you can if you have any sign of an allergic reaction. What side effects may I notice from receiving this medicine? Side effects that you should report to your doctor or health care professional as soon as possible: -allergic reactions like skin rash, itching or hives, swelling of the face, lips, or tongue -breathing problems -confusion -dizziness -fast or irregular heartbeat -feeling faint or lightheaded, falls -fever and chills -loss of balance or coordination -seizures -sweating -swelling of the hands and feet -tightness in the chest -tremors -unusually weak or tired Side effects that usually do not require medical attention (report to your doctor or health care professional if they continue or are bothersome): -constipation or diarrhea -headache This list may not describe all possible  side effects. Call your doctor for medical advice about side effects. You may report side effects to FDA at 1-800-FDA-1088. Where should I keep my medicine? Keep out of the reach of children. Store between 2 and 30 degrees C (36 and 86 degrees F). Throw away any unused medicine after the expiration date. NOTE: This sheet is a summary. It may not cover all possible information. If you have questions about this medicine, talk to your doctor, pharmacist, or health care provider.  2018 Elsevier/Gold Standard (2013-04-30 16:21:52)

## 2018-03-07 ENCOUNTER — Encounter: Payer: Self-pay | Admitting: Neurology

## 2018-03-07 DIAGNOSIS — G43709 Chronic migraine without aura, not intractable, without status migrainosus: Secondary | ICD-10-CM | POA: Insufficient documentation

## 2018-03-07 NOTE — Progress Notes (Signed)
[facility]    Provider:  Dr Jaynee Eagles Referring Provider: Manon Hilding, MD Primary Care Physician:  Manon Hilding, MD  CC:  migraines  HPI:  [name] [name] is a 47 y.o. female here as a new referral from Dr. Quintin Alto for migraines.  Patient has had migraines for many years.  Migraines are unilateral, pulsating pounding throbbing, associated light and sound sensitivity, nausea and vomiting, movement makes it worse.  She is tried and failed multiple medications.  She has daily headaches and 15 days a month or migrainous and can last up to 24 hours and most are moderately severe or severe migraines, sitting in a dark room with no sound and no movement helps, stress and whether or triggers.  No aura.  No medication overuse.  She does have neck pain.  No other focal neurologic deficits, associated symptoms, inciting events or modifiable factors.  Medications tried: Maxalt, propranolol, Imitrex, Zofran, Flexeril, gabapentin, naproxen, Phenergan, tramadol, topiramate, nortriptyline and amitriptyline  Reviewed notes, labs and imaging from outside physicians, which showed:  Reviewed MRI of the brain that she brought on disc MRI of the head without contrast media which showed T2 white matter changes likely incidental chronic microvascular changes which could be from migraine.  Per report her right millimeter pineal region cyst is stable when compared to January 2018 I do not have the 2018 MRI to compare against.  Personally reviewed images MRI cervical spine and agree with the following: C6-C7 disc bulging and facet hypertrophy with moderate right and mild left foraminal stenosis, C5-C6 disc bulging with mild left foraminal stenosis, no intrinsic or compressive spinal cord lesions overall mild normal for age degenerative changes per my review.  Review of Systems: Patient complains of symptoms per HPI as well as the following symptoms: Migraine. Pertinent negatives and positives per  HPI. All others negative.   Social History   Socioeconomic History  . Marital status: Married    Spouse name: Legrand Como  . Number of children: 1  . Years of education: College  . Highest education level: Some college, no degree  Occupational History  . Occupation: other    Comment: Disability  Social Needs  . Financial resource strain: Not on file  . Food insecurity:    Worry: Not on file    Inability: Not on file  . Transportation needs:    Medical: Not on file    Non-medical: Not on file  Tobacco Use  . Smoking status: Current Every Day Smoker    Packs/day: 1.00    Years: 20.00    Pack years: 20.00    Types: Cigarettes  . Smokeless tobacco: Never Used  Substance and Sexual Activity  . Alcohol use: Never    Frequency: Never  . Drug use: Yes    Types: Oxycodone    Comment: oxycodone prescription  . Sexual activity: Not on file  Lifestyle  . Physical activity:    Days per week: Not on file    Minutes per session: Not on file  . Stress: Not on file  Relationships  . Social connections:    Talks on phone: Not on file    Gets together: Not on file    Attends religious service: Not on file    Active member of club or organization: Not on file    Attends meetings of clubs or organizations: Not on file    Relationship status: Not on file  . Intimate partner violence:    Fear of current or  ex partner: Not on file    Emotionally abused: Not on file    Physically abused: Not on file    Forced sexual activity: Not on file  Other Topics Concern  . Not on file  Social History Narrative   Patient lives at home with family.   Caffeine Use: 1 cans of soda daily   Right handed    Family History  Problem Relation Age of Onset  . Other Father        degenerative disc disease  . Heart attack Father   . Diabetes Brother   . Breast cancer Sister   . Uterine cancer Maternal Grandmother   . Esophageal cancer Paternal Uncle   . Bladder Cancer Paternal Uncle   . Bladder  Cancer Paternal Uncle   . Migraines Other        father's side     Past Medical History:  Diagnosis Date  . Anxiety   . 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 Graves disease   . High cholesterol   . High cholesterol   . Macular degeneration, dry   . Migraine   . Stargardt's disease   . Visual disorder     Past Surgical History:  Procedure Laterality Date  . APPENDECTOMY  08-04-2004  . OTHER SURGICAL HISTORY  12-13-2006   fatty tumor removed  . PARTIAL HYSTERECTOMY  08-04-2004    Current Outpatient Medications  Medication Sig Dispense Refill  . ALPRAZolam (XANAX) 0.5 MG tablet Take 0.5 mg by mouth 3 (three) times daily as needed for anxiety.     . dicyclomine (BENTYL) 10 MG capsule Take 1 capsule (10 mg total) by mouth 2 (two) times daily before a meal. (Patient taking differently: Take 10 mg by mouth daily. ) 60 capsule 3  . ergocalciferol (VITAMIN D2) 50000 units capsule Take 50,000 Units by mouth once a week.    . levothyroxine (SYNTHROID, LEVOTHROID) 100 MCG tablet Take 100 mcg by mouth daily before breakfast.     . Omega-3 Fatty Acids (FISH OIL) 1000 MG CAPS Take 2,100 mg by mouth 3 (three) times daily.    . promethazine (PHENERGAN) 25 MG suppository Place 25 mg rectally as needed for nausea or vomiting.    . propranolol ER (INDERAL LA) 160 MG SR capsule Take 160 mg by mouth daily.    . ondansetron (ZOFRAN-ODT) 4 MG disintegrating tablet Take 1 tablet (4 mg total) by mouth every 8 (eight) hours as needed for nausea or vomiting. 30 tablet 11  . OXYCODONE HCL PO Take 5 mg by mouth as needed.    . rizatriptan (MAXALT-MLT) 10 MG disintegrating tablet Take 1 tablet (10 mg total) by mouth as needed for up to 1 dose for migraine. May repeat in 2 hours if needed. Max 2 tabs in 24 hours. 9 tablet 11  . SUMAtriptan (IMITREX) 100 MG tablet Take 50-100 mg by mouth daily as needed.     No current facility-administered medications for this visit.     Allergies as of  03/06/2018 - Review Complete 03/06/2018  Allergen Reaction Noted  . Prednisone  05/21/2008    Vitals: BP (!) 142/88 (BP Location: Right Arm, Patient Position: Sitting)   Pulse 85   Ht 5\' 4"  (6.629 m)   Wt 145 lb (65.8 kg)   BMI 24.89 kg/m  Last Weight:  Wt Readings from Last 1 Encounters:  03/06/18 145 lb (65.8 kg)   Last Height:   Ht Readings from  Last 1 Encounters:  03/06/18 5\' 4"  (1.626 m)    Physical exam: Exam: Gen: NAD, conversant, well nourised, well groomed                     CV: RRR, no MRG. No Carotid Bruits. No peripheral edema, warm, nontender Eyes: Conjunctivae clear without exudates or hemorrhage  Neuro: Detailed Neurologic Exam  Speech:    Speech is normal; fluent and spontaneous with normal comprehension.  Cognition:    The patient is oriented to person, place, and time;     recent and remote memory intact;     language fluent;     normal attention, concentration,     fund of knowledge Cranial Nerves:    The pupils are equal, round, and reactive to light. The fundi are normal and spontaneous venous pulsations are present. Visual fields are full to finger confrontation. Extraocular movements are intact. Trigeminal sensation is intact and the muscles of mastication are normal. The face is symmetric. The palate elevates in the midline. Hearing intact. Voice is normal. Shoulder shrug is normal. The tongue has normal motion without fasciculations.   Coordination:    Normal finger to nose and heel to shin. Normal rapid alternating movements.   Gait:    Heel-toe and tandem gait are normal.   Motor Observation:    No asymmetry, no atrophy, and no involuntary movements noted. Tone:    Normal muscle tone.    Posture:    Posture is normal. normal erect    Strength:    Strength is V/V in the upper and lower limbs.      Sensation: intact to LT     Reflex Exam:  DTR's:    Deep tendon reflexes in the upper and lower extremities are normal bilaterally.    Toes:    The toes are downgoing bilaterally.   Clonus:    Clonus is absent.      Assessment/Plan: This is a patient with chronic migraines he is tried and failed multiple medications for many years, no medication overuse, no aura, no signs or symptoms of sleep apnea, neurologic exam normal.  Recommend Botox for migraines: For migraine prevention Maxalt as needed for migraine, may repeat in 2 hours. Max 2 in 24 hours.  Zofran ODT as needed for nausea/vomiting with migraine Discussed other oral medications that we could try, side effects, benefits, Botox for migraines, CGRP medications**  Discussed: To prevent or relieve headaches, try the following: Cool Compress. Lie down and place a cool compress on your head.  Avoid headache triggers. If certain foods or odors seem to have triggered your migraines in the past, avoid them. A headache diary might help you identify triggers.  Include physical activity in your daily routine. Try a daily walk or other moderate aerobic exercise.  Manage stress. Find healthy ways to cope with the stressors, such as delegating tasks on your to-do list.  Practice relaxation techniques. Try deep breathing, yoga, massage and visualization.  Eat regularly. Eating regularly scheduled meals and maintaining a healthy diet might help prevent headaches. Also, drink plenty of fluids.  Follow a regular sleep schedule. Sleep deprivation might contribute to headaches Consider biofeedback. With this mind-body technique, you learn to control certain bodily functions - such as muscle tension, heart rate and blood pressure - to prevent headaches or reduce headache pain.    Proceed to emergency room if you experience new or worsening symptoms or symptoms do not resolve, if you have new neurologic  symptoms or if headache is severe, or for any concerning symptom.   Provided education and documentation from American headache Society toolbox including articles on: chronic migraine  medication overuse headache, chronic migraines, prevention of migraines, behavioral and other nonpharmacologic treatments for headache.  Orders Placed This Encounter  Procedures  . CBC  . Comprehensive metabolic panel      Sarina Ill, MD  [facility] [address] [place], [place] [address]  Phone [phone] Fax [phone]

## 2018-03-18 DIAGNOSIS — H5213 Myopia, bilateral: Secondary | ICD-10-CM | POA: Diagnosis not present

## 2018-03-18 DIAGNOSIS — H53413 Scotoma involving central area, bilateral: Secondary | ICD-10-CM | POA: Diagnosis not present

## 2018-03-20 ENCOUNTER — Telehealth: Payer: Self-pay | Admitting: Neurology

## 2018-03-20 NOTE — Telephone Encounter (Signed)
Called pharmacy and canceled [place]. D/w MD.

## 2018-03-20 NOTE — Telephone Encounter (Signed)
Gracie with covermymeds requesting a call to discuss if the office is going to continue with the PA for pts [employer]. Call back number [phone] OXU [id number]

## 2018-03-21 ENCOUNTER — Institutional Professional Consult (permissible substitution): Payer: Medicare Other | Admitting: Neurology

## 2018-03-21 ENCOUNTER — Encounter

## 2018-04-02 DIAGNOSIS — M5412 Radiculopathy, cervical region: Secondary | ICD-10-CM | POA: Diagnosis not present

## 2018-04-02 DIAGNOSIS — M542 Cervicalgia: Secondary | ICD-10-CM | POA: Diagnosis not present

## 2018-04-02 DIAGNOSIS — M5440 Lumbago with sciatica, unspecified side: Secondary | ICD-10-CM | POA: Diagnosis not present

## 2018-04-02 DIAGNOSIS — M5416 Radiculopathy, lumbar region: Secondary | ICD-10-CM | POA: Diagnosis not present

## 2018-04-05 ENCOUNTER — Other Ambulatory Visit (HOSPITAL_COMMUNITY): Payer: Self-pay | Admitting: Anesthesiology

## 2018-04-05 DIAGNOSIS — M5412 Radiculopathy, cervical region: Secondary | ICD-10-CM

## 2018-04-11 ENCOUNTER — Telehealth: Payer: Self-pay | Admitting: Neurology

## 2018-04-11 ENCOUNTER — Encounter: Payer: Self-pay | Admitting: *Deleted

## 2018-04-11 ENCOUNTER — Ambulatory Visit (HOSPITAL_COMMUNITY)
Admission: RE | Admit: 2018-04-11 | Discharge: 2018-04-11 | Disposition: A | Discharge status: Home / Self Care | Payer: Medicare Other | Source: Ambulatory Visit | Attending: Anesthesiology | Admitting: Anesthesiology

## 2018-04-11 DIAGNOSIS — M4802 Spinal stenosis, cervical region: Secondary | ICD-10-CM | POA: Insufficient documentation

## 2018-04-11 DIAGNOSIS — M4722 Other spondylosis with radiculopathy, cervical region: Secondary | ICD-10-CM | POA: Insufficient documentation

## 2018-04-11 DIAGNOSIS — M5412 Radiculopathy, cervical region: Secondary | ICD-10-CM

## 2018-04-11 DIAGNOSIS — M50121 Cervical disc disorder at C4-C5 level with radiculopathy: Secondary | ICD-10-CM | POA: Diagnosis not present

## 2018-04-11 NOTE — Telephone Encounter (Signed)
I will email pt and direct her to the [employer].

## 2018-04-11 NOTE — Telephone Encounter (Signed)
[name] [name] or Bethany: see message below. Can we help patient with any assistance program for this medication? Please email patient with information thank yoU!   From patient email:    I want to give you an update since you gave me my 1st injection July 31st I gave myself my 2nd month injection this past Saturday of the Aimovig 140 mg medication.  I have only had 3 migraines since July 31st which is absolutely amazing when I was getting them every day and every other day this medicine is awesome and I'm thankful that you are my doctor and helping me out    I wanted to check with my insurance to find out how much the injections would cost me when you give me a prescription in October when I see you for my follow up appointment and the shot are rather expensive thank goodness I have insurance. I found out it's going to cost $95 a month for the injections I was wondering if you had any way of contacting the insurance company to find out if there's any way to get that price lowered for me so it would be a little more reasonable to purchase them?

## 2018-04-12 ENCOUNTER — Other Ambulatory Visit: Payer: Self-pay | Admitting: Neurology

## 2018-04-12 DIAGNOSIS — S30861A Insect bite (nonvenomous) of abdominal wall, initial encounter: Secondary | ICD-10-CM | POA: Diagnosis not present

## 2018-04-12 DIAGNOSIS — G43711 Chronic migraine without aura, intractable, with status migrainosus: Secondary | ICD-10-CM

## 2018-04-12 DIAGNOSIS — Z6823 Body mass index (BMI) 23.0-23.9, adult: Secondary | ICD-10-CM | POA: Diagnosis not present

## 2018-04-12 MED ORDER — ERENUMAB-AOOE 140 MG/ML ~~LOC~~ SOAJ: 140.0000 mg | SUBCUTANEOUS | 11 refills | Status: DC

## 2018-04-17 ENCOUNTER — Telehealth: Payer: Self-pay

## 2018-04-17 DIAGNOSIS — Z6824 Body mass index (BMI) 24.0-24.9, adult: Secondary | ICD-10-CM | POA: Diagnosis not present

## 2018-04-17 NOTE — Telephone Encounter (Signed)
RN receive fax from [facility] at [phone]. Aimovig inj 140mg /ml approve from 04/17/2018 to 07/17/2018. PA reference is [id number].

## 2018-04-17 NOTE — Telephone Encounter (Signed)
PA for Aimovig pending on cover my meds.

## 2018-04-18 ENCOUNTER — Telehealth: Payer: Self-pay | Admitting: *Deleted

## 2018-04-18 NOTE — Telephone Encounter (Signed)
Received pt's Marine scientist for [employer] assistance. Completed prescriber portion and signed by Dr. Jaynee Eagles. Faxed to [employer]. Received a receipt of confirmation.

## 2018-04-22 ENCOUNTER — Encounter: Payer: Self-pay | Admitting: *Deleted

## 2018-04-22 ENCOUNTER — Other Ambulatory Visit: Payer: Self-pay | Admitting: *Deleted

## 2018-04-22 NOTE — Telephone Encounter (Signed)
[employer] requested copy of approval notification of Aimovig and said that application is on hold for now. I have faxed this to [place]. Received a receipt of confirmation.

## 2018-04-23 MED ORDER — ONDANSETRON 4 MG PO TBDP: 4.0000 mg | ORAL_TABLET | Freq: Three times a day (TID) | ORAL | 3 refills | Status: DC | PRN

## 2018-04-23 MED ORDER — RIZATRIPTAN BENZOATE 10 MG PO TBDP: 10.0000 mg | ORAL_TABLET | ORAL | 3 refills | Status: DC | PRN

## 2018-04-23 NOTE — Telephone Encounter (Signed)
Spoke with pharmacist @ [facility]. He is aware that we have sent refills for Ondansetron and Maxalt to mail order pharmacy. He stated he will keep the refills on hold for backup.

## 2018-04-29 NOTE — Telephone Encounter (Signed)
We received a determination from the [employer]. The pt has been approved for FREE Aimovig from 04/26/2018 through 08/06/2018 as long as pt continues to meet all of the program equirements to remain eligible. They will be contacting pt soon regarding shipment.   Case id: [id number] [place]

## 2018-05-08 ENCOUNTER — Telehealth: Payer: Self-pay | Admitting: *Deleted

## 2018-05-08 NOTE — Telephone Encounter (Signed)
Dr. Jaynee Eagles signed the prescription portion for Aimovig on [place] application and application was faxed to [place]. Received a receipt of confirmation.

## 2018-05-08 NOTE — Telephone Encounter (Signed)
Received pt's application for [employer] through [place]. Printed, completed physician portion. Ready for Dr. Cathren Laine signature.   This application, per pt, will serve to hopefully receive free Aimovig for 2020. Pt already has coverage through 08/06/2018.

## 2018-05-08 NOTE — Telephone Encounter (Signed)
Received notice from [place] that said pt's application is under review and will be processed in the order that it was received.

## 2018-05-09 DIAGNOSIS — M5023 Other cervical disc displacement, cervicothoracic region: Secondary | ICD-10-CM | POA: Diagnosis not present

## 2018-05-17 DIAGNOSIS — M791 Myalgia, unspecified site: Secondary | ICD-10-CM | POA: Diagnosis not present

## 2018-05-17 DIAGNOSIS — Z6823 Body mass index (BMI) 23.0-23.9, adult: Secondary | ICD-10-CM | POA: Diagnosis not present

## 2018-05-17 DIAGNOSIS — R5383 Other fatigue: Secondary | ICD-10-CM | POA: Diagnosis not present

## 2018-05-17 DIAGNOSIS — M2550 Pain in unspecified joint: Secondary | ICD-10-CM | POA: Diagnosis not present

## 2018-05-20 NOTE — Telephone Encounter (Signed)
Received notice from [employer]. Pt is eligible to receive Aimovig through the foundation from 08/07/2018 through 08/07/2019.

## 2018-05-21 DIAGNOSIS — M9903 Segmental and somatic dysfunction of lumbar region: Secondary | ICD-10-CM | POA: Diagnosis not present

## 2018-05-21 DIAGNOSIS — M9902 Segmental and somatic dysfunction of thoracic region: Secondary | ICD-10-CM | POA: Diagnosis not present

## 2018-05-21 DIAGNOSIS — M47812 Spondylosis without myelopathy or radiculopathy, cervical region: Secondary | ICD-10-CM | POA: Diagnosis not present

## 2018-05-21 DIAGNOSIS — M9901 Segmental and somatic dysfunction of cervical region: Secondary | ICD-10-CM | POA: Diagnosis not present

## 2018-05-21 DIAGNOSIS — M542 Cervicalgia: Secondary | ICD-10-CM | POA: Diagnosis not present

## 2018-05-23 DIAGNOSIS — M9903 Segmental and somatic dysfunction of lumbar region: Secondary | ICD-10-CM | POA: Diagnosis not present

## 2018-05-23 DIAGNOSIS — M9901 Segmental and somatic dysfunction of cervical region: Secondary | ICD-10-CM | POA: Diagnosis not present

## 2018-05-23 DIAGNOSIS — M47812 Spondylosis without myelopathy or radiculopathy, cervical region: Secondary | ICD-10-CM | POA: Diagnosis not present

## 2018-05-23 DIAGNOSIS — M542 Cervicalgia: Secondary | ICD-10-CM | POA: Diagnosis not present

## 2018-05-23 DIAGNOSIS — M9902 Segmental and somatic dysfunction of thoracic region: Secondary | ICD-10-CM | POA: Diagnosis not present

## 2018-05-27 DIAGNOSIS — M9903 Segmental and somatic dysfunction of lumbar region: Secondary | ICD-10-CM | POA: Diagnosis not present

## 2018-05-27 DIAGNOSIS — M542 Cervicalgia: Secondary | ICD-10-CM | POA: Diagnosis not present

## 2018-05-27 DIAGNOSIS — M47812 Spondylosis without myelopathy or radiculopathy, cervical region: Secondary | ICD-10-CM | POA: Diagnosis not present

## 2018-05-27 DIAGNOSIS — M9902 Segmental and somatic dysfunction of thoracic region: Secondary | ICD-10-CM | POA: Diagnosis not present

## 2018-05-27 DIAGNOSIS — M9901 Segmental and somatic dysfunction of cervical region: Secondary | ICD-10-CM | POA: Diagnosis not present

## 2018-05-29 DIAGNOSIS — Z23 Encounter for immunization: Secondary | ICD-10-CM | POA: Diagnosis not present

## 2018-05-29 DIAGNOSIS — R5383 Other fatigue: Secondary | ICD-10-CM | POA: Diagnosis not present

## 2018-05-29 DIAGNOSIS — Z0001 Encounter for general adult medical examination with abnormal findings: Secondary | ICD-10-CM | POA: Diagnosis not present

## 2018-05-29 DIAGNOSIS — E7800 Pure hypercholesterolemia, unspecified: Secondary | ICD-10-CM | POA: Diagnosis not present

## 2018-05-29 DIAGNOSIS — Z720 Tobacco use: Secondary | ICD-10-CM | POA: Diagnosis not present

## 2018-05-29 DIAGNOSIS — R002 Palpitations: Secondary | ICD-10-CM | POA: Diagnosis not present

## 2018-05-29 DIAGNOSIS — E559 Vitamin D deficiency, unspecified: Secondary | ICD-10-CM | POA: Diagnosis not present

## 2018-05-30 DIAGNOSIS — M9903 Segmental and somatic dysfunction of lumbar region: Secondary | ICD-10-CM | POA: Diagnosis not present

## 2018-05-30 DIAGNOSIS — M9902 Segmental and somatic dysfunction of thoracic region: Secondary | ICD-10-CM | POA: Diagnosis not present

## 2018-05-30 DIAGNOSIS — M47812 Spondylosis without myelopathy or radiculopathy, cervical region: Secondary | ICD-10-CM | POA: Diagnosis not present

## 2018-05-30 DIAGNOSIS — M9901 Segmental and somatic dysfunction of cervical region: Secondary | ICD-10-CM | POA: Diagnosis not present

## 2018-05-30 DIAGNOSIS — M542 Cervicalgia: Secondary | ICD-10-CM | POA: Diagnosis not present

## 2018-06-03 DIAGNOSIS — M545 Low back pain: Secondary | ICD-10-CM | POA: Diagnosis not present

## 2018-06-03 DIAGNOSIS — E782 Mixed hyperlipidemia: Secondary | ICD-10-CM | POA: Diagnosis not present

## 2018-06-03 DIAGNOSIS — E559 Vitamin D deficiency, unspecified: Secondary | ICD-10-CM | POA: Diagnosis not present

## 2018-06-03 DIAGNOSIS — E039 Hypothyroidism, unspecified: Secondary | ICD-10-CM | POA: Diagnosis not present

## 2018-06-03 DIAGNOSIS — M542 Cervicalgia: Secondary | ICD-10-CM | POA: Diagnosis not present

## 2018-06-05 DIAGNOSIS — M9903 Segmental and somatic dysfunction of lumbar region: Secondary | ICD-10-CM | POA: Diagnosis not present

## 2018-06-05 DIAGNOSIS — M9901 Segmental and somatic dysfunction of cervical region: Secondary | ICD-10-CM | POA: Diagnosis not present

## 2018-06-05 DIAGNOSIS — M542 Cervicalgia: Secondary | ICD-10-CM | POA: Diagnosis not present

## 2018-06-05 DIAGNOSIS — M47812 Spondylosis without myelopathy or radiculopathy, cervical region: Secondary | ICD-10-CM | POA: Diagnosis not present

## 2018-06-05 DIAGNOSIS — M9902 Segmental and somatic dysfunction of thoracic region: Secondary | ICD-10-CM | POA: Diagnosis not present

## 2018-06-06 ENCOUNTER — Ambulatory Visit: Payer: Medicare Other | Admitting: Neurology

## 2018-06-10 DIAGNOSIS — M47812 Spondylosis without myelopathy or radiculopathy, cervical region: Secondary | ICD-10-CM | POA: Diagnosis not present

## 2018-06-10 DIAGNOSIS — M9903 Segmental and somatic dysfunction of lumbar region: Secondary | ICD-10-CM | POA: Diagnosis not present

## 2018-06-10 DIAGNOSIS — M9902 Segmental and somatic dysfunction of thoracic region: Secondary | ICD-10-CM | POA: Diagnosis not present

## 2018-06-10 DIAGNOSIS — M542 Cervicalgia: Secondary | ICD-10-CM | POA: Diagnosis not present

## 2018-06-10 DIAGNOSIS — M9901 Segmental and somatic dysfunction of cervical region: Secondary | ICD-10-CM | POA: Diagnosis not present

## 2018-06-13 DIAGNOSIS — M9901 Segmental and somatic dysfunction of cervical region: Secondary | ICD-10-CM | POA: Diagnosis not present

## 2018-06-13 DIAGNOSIS — M9902 Segmental and somatic dysfunction of thoracic region: Secondary | ICD-10-CM | POA: Diagnosis not present

## 2018-06-13 DIAGNOSIS — M9903 Segmental and somatic dysfunction of lumbar region: Secondary | ICD-10-CM | POA: Diagnosis not present

## 2018-06-13 DIAGNOSIS — M47812 Spondylosis without myelopathy or radiculopathy, cervical region: Secondary | ICD-10-CM | POA: Diagnosis not present

## 2018-06-13 DIAGNOSIS — M542 Cervicalgia: Secondary | ICD-10-CM | POA: Diagnosis not present

## 2018-06-17 DIAGNOSIS — M542 Cervicalgia: Secondary | ICD-10-CM | POA: Diagnosis not present

## 2018-06-17 DIAGNOSIS — M9903 Segmental and somatic dysfunction of lumbar region: Secondary | ICD-10-CM | POA: Diagnosis not present

## 2018-06-17 DIAGNOSIS — M9902 Segmental and somatic dysfunction of thoracic region: Secondary | ICD-10-CM | POA: Diagnosis not present

## 2018-06-17 DIAGNOSIS — M47812 Spondylosis without myelopathy or radiculopathy, cervical region: Secondary | ICD-10-CM | POA: Diagnosis not present

## 2018-06-17 DIAGNOSIS — M9901 Segmental and somatic dysfunction of cervical region: Secondary | ICD-10-CM | POA: Diagnosis not present

## 2018-06-20 DIAGNOSIS — M542 Cervicalgia: Secondary | ICD-10-CM | POA: Diagnosis not present

## 2018-06-20 DIAGNOSIS — M9901 Segmental and somatic dysfunction of cervical region: Secondary | ICD-10-CM | POA: Diagnosis not present

## 2018-06-20 DIAGNOSIS — M9903 Segmental and somatic dysfunction of lumbar region: Secondary | ICD-10-CM | POA: Diagnosis not present

## 2018-06-20 DIAGNOSIS — M47812 Spondylosis without myelopathy or radiculopathy, cervical region: Secondary | ICD-10-CM | POA: Diagnosis not present

## 2018-06-20 DIAGNOSIS — M9902 Segmental and somatic dysfunction of thoracic region: Secondary | ICD-10-CM | POA: Diagnosis not present

## 2018-06-25 DIAGNOSIS — M9901 Segmental and somatic dysfunction of cervical region: Secondary | ICD-10-CM | POA: Diagnosis not present

## 2018-06-25 DIAGNOSIS — M9903 Segmental and somatic dysfunction of lumbar region: Secondary | ICD-10-CM | POA: Diagnosis not present

## 2018-06-25 DIAGNOSIS — M9902 Segmental and somatic dysfunction of thoracic region: Secondary | ICD-10-CM | POA: Diagnosis not present

## 2018-06-25 DIAGNOSIS — M542 Cervicalgia: Secondary | ICD-10-CM | POA: Diagnosis not present

## 2018-06-25 DIAGNOSIS — M47812 Spondylosis without myelopathy or radiculopathy, cervical region: Secondary | ICD-10-CM | POA: Diagnosis not present

## 2018-06-27 DIAGNOSIS — M9903 Segmental and somatic dysfunction of lumbar region: Secondary | ICD-10-CM | POA: Diagnosis not present

## 2018-06-27 DIAGNOSIS — M9901 Segmental and somatic dysfunction of cervical region: Secondary | ICD-10-CM | POA: Diagnosis not present

## 2018-06-27 DIAGNOSIS — M542 Cervicalgia: Secondary | ICD-10-CM | POA: Diagnosis not present

## 2018-06-27 DIAGNOSIS — M47812 Spondylosis without myelopathy or radiculopathy, cervical region: Secondary | ICD-10-CM | POA: Diagnosis not present

## 2018-06-27 DIAGNOSIS — M9902 Segmental and somatic dysfunction of thoracic region: Secondary | ICD-10-CM | POA: Diagnosis not present

## 2018-07-01 ENCOUNTER — Telehealth: Payer: Self-pay

## 2018-07-01 NOTE — Telephone Encounter (Signed)
PA Aimovig done on cover my meds.

## 2018-07-03 ENCOUNTER — Encounter: Payer: Self-pay | Admitting: Neurology

## 2018-07-03 ENCOUNTER — Ambulatory Visit: Payer: Medicare Other | Admitting: Neurology

## 2018-07-03 VITALS — BP 132/87 | HR 100 | Ht 64.0 in | Wt 141.0 lb

## 2018-07-03 DIAGNOSIS — M9902 Segmental and somatic dysfunction of thoracic region: Secondary | ICD-10-CM | POA: Diagnosis not present

## 2018-07-03 DIAGNOSIS — M542 Cervicalgia: Secondary | ICD-10-CM | POA: Diagnosis not present

## 2018-07-03 DIAGNOSIS — M9901 Segmental and somatic dysfunction of cervical region: Secondary | ICD-10-CM | POA: Diagnosis not present

## 2018-07-03 DIAGNOSIS — G43711 Chronic migraine without aura, intractable, with status migrainosus: Secondary | ICD-10-CM | POA: Diagnosis not present

## 2018-07-03 DIAGNOSIS — M47812 Spondylosis without myelopathy or radiculopathy, cervical region: Secondary | ICD-10-CM | POA: Diagnosis not present

## 2018-07-03 DIAGNOSIS — M9903 Segmental and somatic dysfunction of lumbar region: Secondary | ICD-10-CM | POA: Diagnosis not present

## 2018-07-03 NOTE — Telephone Encounter (Signed)
PA approve via [employer] rx on cover my meds. Aimovig approve from 07/01/2018 to 08/07/2019. [id number].Patient ID is [id number]. PA reference is [id number].

## 2018-07-03 NOTE — Progress Notes (Signed)
[facility]    Provider:  Dr Jaynee Eagles Referring Provider: Manon Hilding, MD Primary Care Physician:  Manon Hilding, MD  CC:  migraines  Interval history 07/03/2018: She is here for follow up on chronic intractable migraines. Started on Aimovig and doing extremely well. Baseline was  daily headaches and 15 days a month or migrainous. She returns today after 4 months with excellent progress. She has failed multiple medications for years. The aimovig worked quickly. She only has a migraine the day she has an injection. Recommend taking maxalt with the injection. She has NOT HAD ONE MIGRAINE since starting. She has had several mild headaches. Discussed pre-treatment.   HPI:  [name] [name] is a 47 y.o. female here as a new referral from Dr. Quintin Alto for migraines.  Patient has had migraines for many years.  Migraines are unilateral, pulsating pounding throbbing, associated light and sound sensitivity, nausea and vomiting, movement makes it worse.  She is tried and failed multiple medications.  She has daily headaches and 15 days a month or migrainous and can last up to 24 hours and most are moderately severe or severe migraines, sitting in a dark room with no sound and no movement helps, stress and whether or triggers.  No aura.  No medication overuse.  She does have neck pain.  No other focal neurologic deficits, associated symptoms, inciting events or modifiable factors.  Medications tried: Maxalt, propranolol, Imitrex, Zofran, Flexeril, gabapentin, naproxen, Phenergan, tramadol, topiramate, nortriptyline and amitriptyline  Reviewed notes, labs and imaging from outside physicians, which showed:  Reviewed MRI of the brain that she brought on disc MRI of the head without contrast media which showed T2 white matter changes likely incidental chronic microvascular changes which could be from migraine.  Per report her right millimeter pineal region cyst is stable when compared to  January 2018 I do not have the 2018 MRI to compare against.  Personally reviewed images MRI cervical spine and agree with the following: C6-C7 disc bulging and facet hypertrophy with moderate right and mild left foraminal stenosis, C5-C6 disc bulging with mild left foraminal stenosis, no intrinsic or compressive spinal cord lesions overall mild normal for age degenerative changes per my review.  Review of Systems: Patient complains of symptoms per HPI as well as the following symptoms: Migraine. Pertinent negatives and positives per HPI. All others negative.   Social History   Socioeconomic History  . Marital status: Married    Spouse name: Legrand Como  . Number of children: 1  . Years of education: College  . Highest education level: Some college, no degree  Occupational History  . Occupation: other    Comment: Disability  Social Needs  . Financial resource strain: Not on file  . Food insecurity:    Worry: Not on file    Inability: Not on file  . Transportation needs:    Medical: Not on file    Non-medical: Not on file  Tobacco Use  . Smoking status: Current Every Day Smoker    Packs/day: 1.00    Years: 20.00    Pack years: 20.00    Types: Cigarettes  . Smokeless tobacco: Never Used  Substance and Sexual Activity  . Alcohol use: Never    Frequency: Never  . Drug use: Yes    Types: Oxycodone    Comment: oxycodone prescription  . Sexual activity: Not on file  Lifestyle  . Physical activity:    Days per week: Not on file    Minutes  per session: Not on file  . Stress: Not on file  Relationships  . Social connections:    Talks on phone: Not on file    Gets together: Not on file    Attends religious service: Not on file    Active member of club or organization: Not on file    Attends meetings of clubs or organizations: Not on file    Relationship status: Not on file  . Intimate partner violence:    Fear of current or ex partner: Not on file    Emotionally abused: Not on  file    Physically abused: Not on file    Forced sexual activity: Not on file  Other Topics Concern  . Not on file  Social History Narrative   Patient lives at home with family.   Caffeine Use: 1 cans of soda daily   Right handed    Family History  Problem Relation Age of Onset  . Other Father        degenerative disc disease  . Heart attack Father   . Diabetes Brother   . Breast cancer Sister   . Uterine cancer Maternal Grandmother   . Esophageal cancer Paternal Uncle   . Bladder Cancer Paternal Uncle   . Bladder Cancer Paternal Uncle   . Migraines Other        father's side     Past Medical History:  Diagnosis Date  . Anxiety   . 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 Graves disease   . High cholesterol   . High cholesterol   . Macular degeneration, dry   . Migraine   . Stargardt's disease   . Visual disorder     Past Surgical History:  Procedure Laterality Date  . APPENDECTOMY  08-04-2004  . OTHER SURGICAL HISTORY  12-13-2006   fatty tumor removed  . PARTIAL HYSTERECTOMY  08-04-2004    Current Outpatient Medications  Medication Sig Dispense Refill  . ALPRAZolam (XANAX) 0.5 MG tablet Take 0.5 mg by mouth 3 (three) times daily as needed for anxiety.     . dicyclomine (BENTYL) 10 MG capsule Take 1 capsule (10 mg total) by mouth 2 (two) times daily before a meal. (Patient taking differently: Take 10 mg by mouth daily. ) 60 capsule 3  . Erenumab-aooe (AIMOVIG) 140 MG/ML SOAJ Inject 140 mg into the skin every 30 (thirty) days. 1 pen 11  . ergocalciferol (VITAMIN D2) 50000 units capsule Take 50,000 Units by mouth once a week.    . levothyroxine (SYNTHROID, LEVOTHROID) 100 MCG tablet Take 100 mcg by mouth daily before breakfast.     . Omega-3 Fatty Acids (FISH OIL) 1000 MG CAPS Take 2,100 mg by mouth 3 (three) times daily.    . ondansetron (ZOFRAN-ODT) 4 MG disintegrating tablet Take 1 tablet (4 mg total) by mouth every 8 (eight) hours as needed for  nausea or vomiting. Max 30 per month. 90 tablet 3  . OXYCODONE HCL PO Take 5 mg by mouth as needed.    . propranolol ER (INDERAL LA) 160 MG SR capsule Take 160 mg by mouth daily.    . rizatriptan (MAXALT-MLT) 10 MG disintegrating tablet Take 1 tablet (10 mg total) by mouth as needed for up to 1 dose for migraine. May repeat in 2 hours if needed. Max 2 tabs in 24 hours. 27 tablet 3   No current facility-administered medications for this visit.     Allergies as of 07/03/2018 -  Review Complete 07/03/2018  Allergen Reaction Noted  . Prednisone  05/21/2008    Vitals: BP 132/87 (BP Location: Right Arm, Patient Position: Sitting)   Pulse 100   Ht 5\' 4"  (1.626 m)   Wt 141 lb (64 kg)   BMI 24.20 kg/m  Last Weight:  Wt Readings from Last 1 Encounters:  07/03/18 141 lb (64 kg)   Last Height:   Ht Readings from Last 1 Encounters:  07/03/18 5\' 4"  (1.626 m)    Physical exam: Exam: Gen: NAD, conversant, well nourised, well groomed                     CV: RRR, no MRG. No Carotid Bruits. No peripheral edema, warm, nontender Eyes: Conjunctivae clear without exudates or hemorrhage  Neuro: Detailed Neurologic Exam  Speech:    Speech is normal; fluent and spontaneous with normal comprehension.  Cognition:    The patient is oriented to person, place, and time;     recent and remote memory intact;     language fluent;     normal attention, concentration,     fund of knowledge Cranial Nerves:    The pupils are equal, round, and reactive to light. The fundi are normal and spontaneous venous pulsations are present. Visual fields are full to finger confrontation. Extraocular movements are intact. Trigeminal sensation is intact and the muscles of mastication are normal. The face is symmetric. The palate elevates in the midline. Hearing intact. Voice is normal. Shoulder shrug is normal. The tongue has normal motion without fasciculations.   Coordination:    Normal finger to nose and heel to  shin. Normal rapid alternating movements.   Gait:    Heel-toe and tandem gait are normal.   Motor Observation:    No asymmetry, no atrophy, and no involuntary movements noted. Tone:    Normal muscle tone.    Posture:    Posture is normal. normal erect    Strength:    Strength is V/V in the upper and lower limbs.      Sensation: intact to LT     Reflex Exam:  DTR's:    Deep tendon reflexes in the upper and lower extremities are normal bilaterally.   Toes:    The toes are downgoing bilaterally.   Clonus:    Clonus is absent.      Assessment/Plan: This is a patient with chronic migraines he is tried and failed multiple medications for many years, no medication overuse, no aura, no signs or symptoms of sleep apnea, neurologic exam normal.  EXCEPTIONAL response not one migraine except when she uses the injection. Suggested pretreating with maxalt and alleve. May tryu almotriptan or frova if the migraine rebounds  Continue maxalt and zofran for acute management and for nausea  RTC in one year  Discussed: To prevent or relieve headaches, try the following: Cool Compress. Lie down and place a cool compress on your head.  Avoid headache triggers. If certain foods or odors seem to have triggered your migraines in the past, avoid them. A headache diary might help you identify triggers.  Include physical activity in your daily routine. Try a daily walk or other moderate aerobic exercise.  Manage stress. Find healthy ways to cope with the stressors, such as delegating tasks on your to-do list.  Practice relaxation techniques. Try deep breathing, yoga, massage and visualization.  Eat regularly. Eating regularly scheduled meals and maintaining a healthy diet might help prevent headaches. Also, drink plenty of  fluids.  Follow a regular sleep schedule. Sleep deprivation might contribute to headaches Consider biofeedback. With this mind-body technique, you learn to control certain bodily  functions - such as muscle tension, heart rate and blood pressure - to prevent headaches or reduce headache pain.    Proceed to emergency room if you experience new or worsening symptoms or symptoms do not resolve, if you have new neurologic symptoms or if headache is severe, or for any concerning symptom.   Provided education and documentation from American headache Society toolbox including articles on: chronic migraine medication overuse headache, chronic migraines, prevention of migraines, behavioral and other nonpharmacologic treatments for headache.  A total of 15 minutes was spent face-to-face with this patient. Over half this time was spent on counseling patient on the  1. Chronic migraine without aura, with intractable migraine, so stated, with status migrainosus     diagnosis and different diagnostic and therapeutic options, counseling and coordination of care, risks ans benefits of management, compliance, or risk factor reduction and education.     Sarina Ill, MD  [facility] [address] [place], [place] [address]  Phone [phone] Fax [phone]

## 2018-07-09 ENCOUNTER — Encounter: Payer: Self-pay | Admitting: *Deleted

## 2018-07-09 NOTE — Telephone Encounter (Signed)
Pt was already approved for assistance through [employer] safety [employer] foundation through 08/07/2019. Now Aimovig has been approved by insurance through 08/07/2019. Approval letter faxed to [employer] so they are aware and copy was sent to medical records for scanning. Received a receipt of confirmation.

## 2018-07-10 DIAGNOSIS — M9903 Segmental and somatic dysfunction of lumbar region: Secondary | ICD-10-CM | POA: Diagnosis not present

## 2018-07-10 DIAGNOSIS — M9902 Segmental and somatic dysfunction of thoracic region: Secondary | ICD-10-CM | POA: Diagnosis not present

## 2018-07-10 DIAGNOSIS — M47812 Spondylosis without myelopathy or radiculopathy, cervical region: Secondary | ICD-10-CM | POA: Diagnosis not present

## 2018-07-10 DIAGNOSIS — M9901 Segmental and somatic dysfunction of cervical region: Secondary | ICD-10-CM | POA: Diagnosis not present

## 2018-07-10 DIAGNOSIS — M542 Cervicalgia: Secondary | ICD-10-CM | POA: Diagnosis not present

## 2018-07-17 DIAGNOSIS — M9902 Segmental and somatic dysfunction of thoracic region: Secondary | ICD-10-CM | POA: Diagnosis not present

## 2018-07-17 DIAGNOSIS — M47812 Spondylosis without myelopathy or radiculopathy, cervical region: Secondary | ICD-10-CM | POA: Diagnosis not present

## 2018-07-17 DIAGNOSIS — R030 Elevated blood-pressure reading, without diagnosis of hypertension: Secondary | ICD-10-CM | POA: Diagnosis not present

## 2018-07-17 DIAGNOSIS — M9903 Segmental and somatic dysfunction of lumbar region: Secondary | ICD-10-CM | POA: Diagnosis not present

## 2018-07-17 DIAGNOSIS — M542 Cervicalgia: Secondary | ICD-10-CM | POA: Diagnosis not present

## 2018-07-17 DIAGNOSIS — M9901 Segmental and somatic dysfunction of cervical region: Secondary | ICD-10-CM | POA: Diagnosis not present

## 2018-07-18 DIAGNOSIS — Z6822 Body mass index (BMI) 22.0-22.9, adult: Secondary | ICD-10-CM | POA: Diagnosis not present

## 2018-07-18 DIAGNOSIS — J0101 Acute recurrent maxillary sinusitis: Secondary | ICD-10-CM | POA: Diagnosis not present

## 2018-07-29 DIAGNOSIS — M9903 Segmental and somatic dysfunction of lumbar region: Secondary | ICD-10-CM | POA: Diagnosis not present

## 2018-07-29 DIAGNOSIS — M9902 Segmental and somatic dysfunction of thoracic region: Secondary | ICD-10-CM | POA: Diagnosis not present

## 2018-07-29 DIAGNOSIS — M9901 Segmental and somatic dysfunction of cervical region: Secondary | ICD-10-CM | POA: Diagnosis not present

## 2018-07-29 DIAGNOSIS — M542 Cervicalgia: Secondary | ICD-10-CM | POA: Diagnosis not present

## 2018-07-29 DIAGNOSIS — M47812 Spondylosis without myelopathy or radiculopathy, cervical region: Secondary | ICD-10-CM | POA: Diagnosis not present

## 2018-08-12 DIAGNOSIS — M9902 Segmental and somatic dysfunction of thoracic region: Secondary | ICD-10-CM | POA: Diagnosis not present

## 2018-08-12 DIAGNOSIS — M9903 Segmental and somatic dysfunction of lumbar region: Secondary | ICD-10-CM | POA: Diagnosis not present

## 2018-08-12 DIAGNOSIS — M47812 Spondylosis without myelopathy or radiculopathy, cervical region: Secondary | ICD-10-CM | POA: Diagnosis not present

## 2018-08-12 DIAGNOSIS — M9901 Segmental and somatic dysfunction of cervical region: Secondary | ICD-10-CM | POA: Diagnosis not present

## 2018-08-12 DIAGNOSIS — M542 Cervicalgia: Secondary | ICD-10-CM | POA: Diagnosis not present

## 2018-08-15 DIAGNOSIS — L820 Inflamed seborrheic keratosis: Secondary | ICD-10-CM | POA: Diagnosis not present

## 2018-08-15 DIAGNOSIS — D229 Melanocytic nevi, unspecified: Secondary | ICD-10-CM | POA: Diagnosis not present

## 2018-08-15 DIAGNOSIS — B360 Pityriasis versicolor: Secondary | ICD-10-CM | POA: Diagnosis not present

## 2018-08-22 DIAGNOSIS — M47812 Spondylosis without myelopathy or radiculopathy, cervical region: Secondary | ICD-10-CM | POA: Diagnosis not present

## 2018-08-22 DIAGNOSIS — M9902 Segmental and somatic dysfunction of thoracic region: Secondary | ICD-10-CM | POA: Diagnosis not present

## 2018-08-22 DIAGNOSIS — M9901 Segmental and somatic dysfunction of cervical region: Secondary | ICD-10-CM | POA: Diagnosis not present

## 2018-08-22 DIAGNOSIS — M542 Cervicalgia: Secondary | ICD-10-CM | POA: Diagnosis not present

## 2018-08-22 DIAGNOSIS — M9903 Segmental and somatic dysfunction of lumbar region: Secondary | ICD-10-CM | POA: Diagnosis not present

## 2018-09-05 DIAGNOSIS — M9903 Segmental and somatic dysfunction of lumbar region: Secondary | ICD-10-CM | POA: Diagnosis not present

## 2018-09-05 DIAGNOSIS — M47812 Spondylosis without myelopathy or radiculopathy, cervical region: Secondary | ICD-10-CM | POA: Diagnosis not present

## 2018-09-05 DIAGNOSIS — M542 Cervicalgia: Secondary | ICD-10-CM | POA: Diagnosis not present

## 2018-09-05 DIAGNOSIS — M9901 Segmental and somatic dysfunction of cervical region: Secondary | ICD-10-CM | POA: Diagnosis not present

## 2018-09-05 DIAGNOSIS — M9902 Segmental and somatic dysfunction of thoracic region: Secondary | ICD-10-CM | POA: Diagnosis not present

## 2018-09-19 DIAGNOSIS — M9903 Segmental and somatic dysfunction of lumbar region: Secondary | ICD-10-CM | POA: Diagnosis not present

## 2018-09-19 DIAGNOSIS — M9902 Segmental and somatic dysfunction of thoracic region: Secondary | ICD-10-CM | POA: Diagnosis not present

## 2018-09-19 DIAGNOSIS — M9901 Segmental and somatic dysfunction of cervical region: Secondary | ICD-10-CM | POA: Diagnosis not present

## 2018-09-19 DIAGNOSIS — M542 Cervicalgia: Secondary | ICD-10-CM | POA: Diagnosis not present

## 2018-09-19 DIAGNOSIS — M47812 Spondylosis without myelopathy or radiculopathy, cervical region: Secondary | ICD-10-CM | POA: Diagnosis not present

## 2018-09-25 DIAGNOSIS — M722 Plantar fascial fibromatosis: Secondary | ICD-10-CM | POA: Diagnosis not present

## 2018-09-25 DIAGNOSIS — Z6823 Body mass index (BMI) 23.0-23.9, adult: Secondary | ICD-10-CM | POA: Diagnosis not present

## 2018-10-03 DIAGNOSIS — M542 Cervicalgia: Secondary | ICD-10-CM | POA: Diagnosis not present

## 2018-10-03 DIAGNOSIS — M9903 Segmental and somatic dysfunction of lumbar region: Secondary | ICD-10-CM | POA: Diagnosis not present

## 2018-10-03 DIAGNOSIS — M9902 Segmental and somatic dysfunction of thoracic region: Secondary | ICD-10-CM | POA: Diagnosis not present

## 2018-10-03 DIAGNOSIS — M9901 Segmental and somatic dysfunction of cervical region: Secondary | ICD-10-CM | POA: Diagnosis not present

## 2018-10-03 DIAGNOSIS — M47812 Spondylosis without myelopathy or radiculopathy, cervical region: Secondary | ICD-10-CM | POA: Diagnosis not present

## 2018-10-10 DIAGNOSIS — Z6823 Body mass index (BMI) 23.0-23.9, adult: Secondary | ICD-10-CM | POA: Diagnosis not present

## 2018-10-15 ENCOUNTER — Other Ambulatory Visit: Payer: Self-pay | Admitting: *Deleted

## 2018-10-15 ENCOUNTER — Telehealth: Payer: Self-pay | Admitting: Neurology

## 2018-10-15 DIAGNOSIS — G43711 Chronic migraine without aura, intractable, with status migrainosus: Secondary | ICD-10-CM

## 2018-10-15 MED ORDER — ERENUMAB-AOOE 140 MG/ML ~~LOC~~ SOAJ: 140.0000 mg | SUBCUTANEOUS | 2 refills | Status: DC

## 2018-10-15 NOTE — Telephone Encounter (Signed)
Prescription refill for Aimovig signed by Dr. Jaynee Eagles and faxed to [facility]. Received a receipt of confirmation.

## 2018-10-15 NOTE — Telephone Encounter (Signed)
[place] [phone] opt 5 request new script for Erenumab-aooe (AIMOVIG) 140 MG/ML SOAJ faxed to [phone].

## 2018-10-15 NOTE — Telephone Encounter (Signed)
Prescription printed & ready for signature.

## 2018-10-25 DIAGNOSIS — M47812 Spondylosis without myelopathy or radiculopathy, cervical region: Secondary | ICD-10-CM | POA: Diagnosis not present

## 2018-10-25 DIAGNOSIS — M9902 Segmental and somatic dysfunction of thoracic region: Secondary | ICD-10-CM | POA: Diagnosis not present

## 2018-10-25 DIAGNOSIS — M9901 Segmental and somatic dysfunction of cervical region: Secondary | ICD-10-CM | POA: Diagnosis not present

## 2018-10-25 DIAGNOSIS — M9903 Segmental and somatic dysfunction of lumbar region: Secondary | ICD-10-CM | POA: Diagnosis not present

## 2018-10-25 DIAGNOSIS — M542 Cervicalgia: Secondary | ICD-10-CM | POA: Diagnosis not present

## 2018-10-31 ENCOUNTER — Encounter: Payer: Self-pay | Admitting: *Deleted

## 2018-11-04 DIAGNOSIS — M9902 Segmental and somatic dysfunction of thoracic region: Secondary | ICD-10-CM | POA: Diagnosis not present

## 2018-11-04 DIAGNOSIS — M47812 Spondylosis without myelopathy or radiculopathy, cervical region: Secondary | ICD-10-CM | POA: Diagnosis not present

## 2018-11-04 DIAGNOSIS — M9901 Segmental and somatic dysfunction of cervical region: Secondary | ICD-10-CM | POA: Diagnosis not present

## 2018-11-04 DIAGNOSIS — M9903 Segmental and somatic dysfunction of lumbar region: Secondary | ICD-10-CM | POA: Diagnosis not present

## 2018-11-04 DIAGNOSIS — M542 Cervicalgia: Secondary | ICD-10-CM | POA: Diagnosis not present

## 2018-11-07 DIAGNOSIS — M9902 Segmental and somatic dysfunction of thoracic region: Secondary | ICD-10-CM | POA: Diagnosis not present

## 2018-11-07 DIAGNOSIS — M9901 Segmental and somatic dysfunction of cervical region: Secondary | ICD-10-CM | POA: Diagnosis not present

## 2018-11-07 DIAGNOSIS — M47812 Spondylosis without myelopathy or radiculopathy, cervical region: Secondary | ICD-10-CM | POA: Diagnosis not present

## 2018-11-07 DIAGNOSIS — M542 Cervicalgia: Secondary | ICD-10-CM | POA: Diagnosis not present

## 2018-11-07 DIAGNOSIS — M9903 Segmental and somatic dysfunction of lumbar region: Secondary | ICD-10-CM | POA: Diagnosis not present

## 2018-11-11 DIAGNOSIS — M47812 Spondylosis without myelopathy or radiculopathy, cervical region: Secondary | ICD-10-CM | POA: Diagnosis not present

## 2018-11-11 DIAGNOSIS — M9901 Segmental and somatic dysfunction of cervical region: Secondary | ICD-10-CM | POA: Diagnosis not present

## 2018-11-11 DIAGNOSIS — M9902 Segmental and somatic dysfunction of thoracic region: Secondary | ICD-10-CM | POA: Diagnosis not present

## 2018-11-11 DIAGNOSIS — M9903 Segmental and somatic dysfunction of lumbar region: Secondary | ICD-10-CM | POA: Diagnosis not present

## 2018-11-11 DIAGNOSIS — M542 Cervicalgia: Secondary | ICD-10-CM | POA: Diagnosis not present

## 2018-11-18 DIAGNOSIS — M9903 Segmental and somatic dysfunction of lumbar region: Secondary | ICD-10-CM | POA: Diagnosis not present

## 2018-11-18 DIAGNOSIS — M542 Cervicalgia: Secondary | ICD-10-CM | POA: Diagnosis not present

## 2018-11-18 DIAGNOSIS — M47812 Spondylosis without myelopathy or radiculopathy, cervical region: Secondary | ICD-10-CM | POA: Diagnosis not present

## 2018-11-18 DIAGNOSIS — M9901 Segmental and somatic dysfunction of cervical region: Secondary | ICD-10-CM | POA: Diagnosis not present

## 2018-11-18 DIAGNOSIS — M9902 Segmental and somatic dysfunction of thoracic region: Secondary | ICD-10-CM | POA: Diagnosis not present

## 2018-11-25 DIAGNOSIS — M542 Cervicalgia: Secondary | ICD-10-CM | POA: Diagnosis not present

## 2018-11-25 DIAGNOSIS — M9901 Segmental and somatic dysfunction of cervical region: Secondary | ICD-10-CM | POA: Diagnosis not present

## 2018-11-25 DIAGNOSIS — M9903 Segmental and somatic dysfunction of lumbar region: Secondary | ICD-10-CM | POA: Diagnosis not present

## 2018-11-25 DIAGNOSIS — M47812 Spondylosis without myelopathy or radiculopathy, cervical region: Secondary | ICD-10-CM | POA: Diagnosis not present

## 2018-11-25 DIAGNOSIS — M9902 Segmental and somatic dysfunction of thoracic region: Secondary | ICD-10-CM | POA: Diagnosis not present

## 2018-11-29 DIAGNOSIS — E039 Hypothyroidism, unspecified: Secondary | ICD-10-CM | POA: Diagnosis not present

## 2018-11-29 DIAGNOSIS — E559 Vitamin D deficiency, unspecified: Secondary | ICD-10-CM | POA: Diagnosis not present

## 2018-11-29 DIAGNOSIS — Z720 Tobacco use: Secondary | ICD-10-CM | POA: Diagnosis not present

## 2018-11-29 DIAGNOSIS — E7800 Pure hypercholesterolemia, unspecified: Secondary | ICD-10-CM | POA: Diagnosis not present

## 2018-11-29 DIAGNOSIS — E782 Mixed hyperlipidemia: Secondary | ICD-10-CM | POA: Diagnosis not present

## 2018-12-03 DIAGNOSIS — M545 Low back pain: Secondary | ICD-10-CM | POA: Diagnosis not present

## 2018-12-03 DIAGNOSIS — M542 Cervicalgia: Secondary | ICD-10-CM | POA: Diagnosis not present

## 2018-12-03 DIAGNOSIS — Z6822 Body mass index (BMI) 22.0-22.9, adult: Secondary | ICD-10-CM | POA: Diagnosis not present

## 2018-12-03 DIAGNOSIS — E782 Mixed hyperlipidemia: Secondary | ICD-10-CM | POA: Diagnosis not present

## 2018-12-03 DIAGNOSIS — E559 Vitamin D deficiency, unspecified: Secondary | ICD-10-CM | POA: Diagnosis not present

## 2018-12-03 DIAGNOSIS — E039 Hypothyroidism, unspecified: Secondary | ICD-10-CM | POA: Diagnosis not present

## 2018-12-12 DIAGNOSIS — M25561 Pain in right knee: Secondary | ICD-10-CM | POA: Diagnosis not present

## 2018-12-12 DIAGNOSIS — G8929 Other chronic pain: Secondary | ICD-10-CM | POA: Diagnosis not present

## 2018-12-12 DIAGNOSIS — M25552 Pain in left hip: Secondary | ICD-10-CM | POA: Diagnosis not present

## 2018-12-12 DIAGNOSIS — Z6823 Body mass index (BMI) 23.0-23.9, adult: Secondary | ICD-10-CM | POA: Diagnosis not present

## 2018-12-12 DIAGNOSIS — M25551 Pain in right hip: Secondary | ICD-10-CM | POA: Diagnosis not present

## 2018-12-12 DIAGNOSIS — M25562 Pain in left knee: Secondary | ICD-10-CM | POA: Diagnosis not present

## 2019-01-16 DIAGNOSIS — M810 Age-related osteoporosis without current pathological fracture: Secondary | ICD-10-CM | POA: Diagnosis not present

## 2019-01-16 DIAGNOSIS — Z13820 Encounter for screening for osteoporosis: Secondary | ICD-10-CM | POA: Diagnosis not present

## 2019-02-05 IMAGING — CT CT ABD-PELV W/ CM
2 of 4 series · 16 of 46 positions shown, 18 images · IV contrast (Isovue)
Comparison: 01/08/2014

CLINICAL DATA: Left lower quadrant pain and diarrhea x3 months.
Appendectomy and hysterectomy.

EXAM:
CT ABDOMEN AND PELVIS WITH CONTRAST
TECHNIQUE: Multidetector CT imaging of the abdomen and pelvis was performed
using the standard protocol following bolus administration of
intravenous contrast.
CONTRAST:  100mL GLVPJR-455 IOPAMIDOL (GLVPJR-455) INJECTION 61%

[Series 2: axial st · axial · 0.65mm/px · z∈[+963,+1353]mm · 13 of 88 slices shown, 15 images]
[im 5/88  soft-tissue]
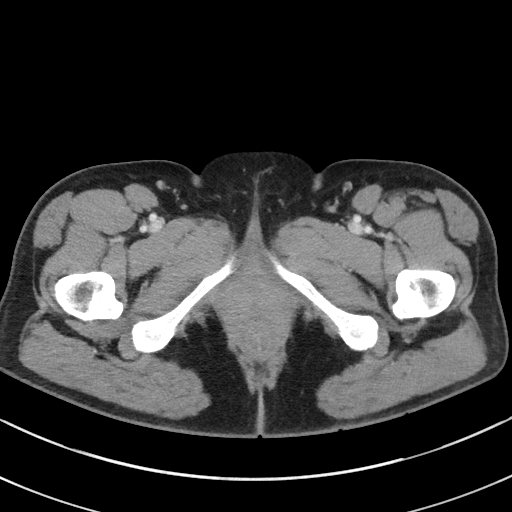
[im 5/88  bone]
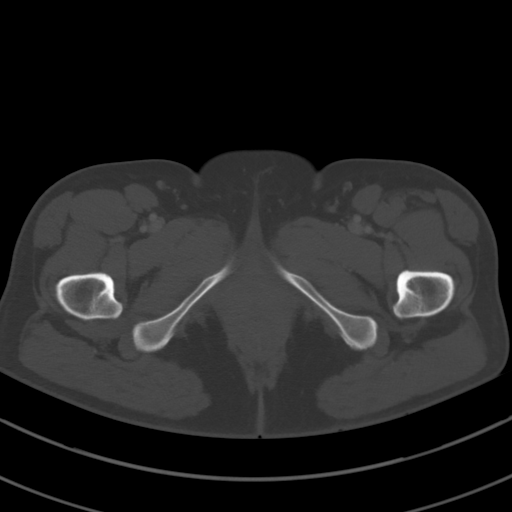
[im 14/88  soft-tissue]
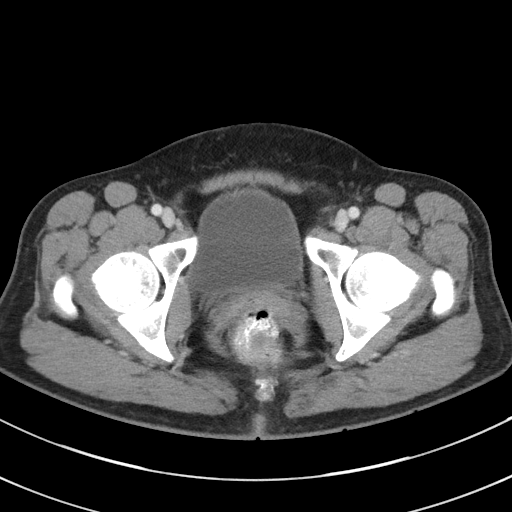
[im 18/88  soft-tissue]
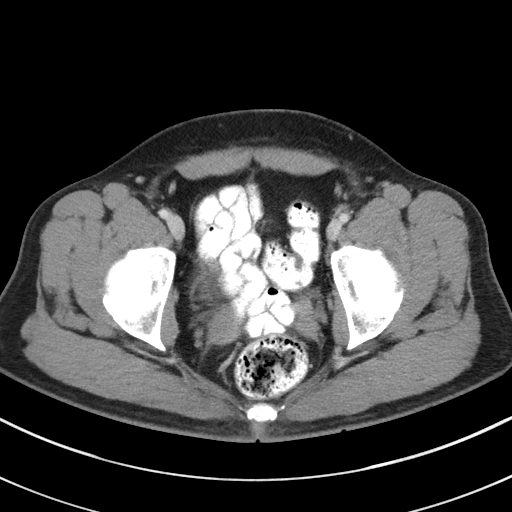
[im 27/88  soft-tissue]
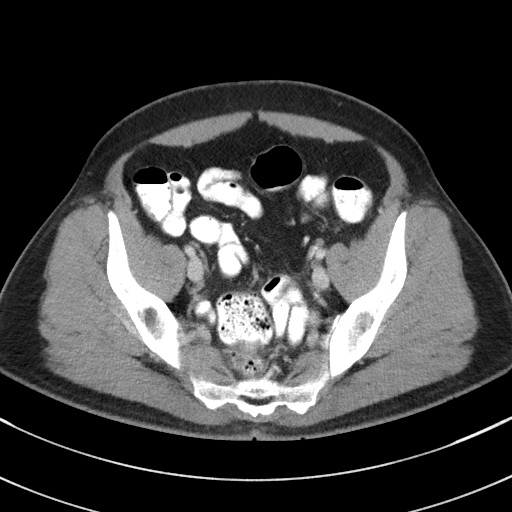
[im 31/88  soft-tissue]
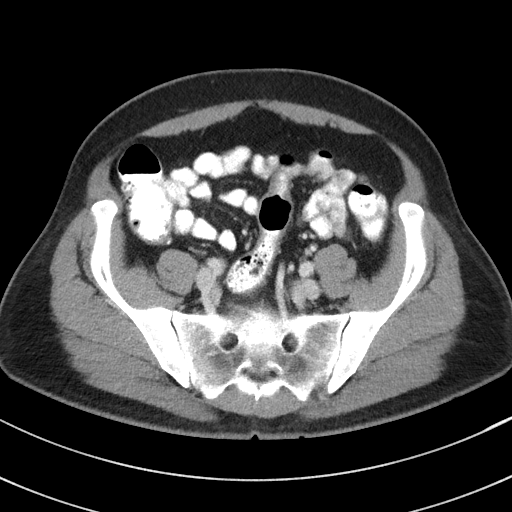
[im 40/88  soft-tissue]
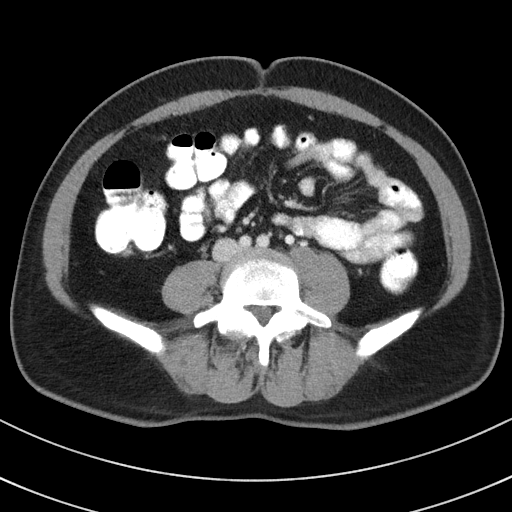
[im 44/88  soft-tissue]
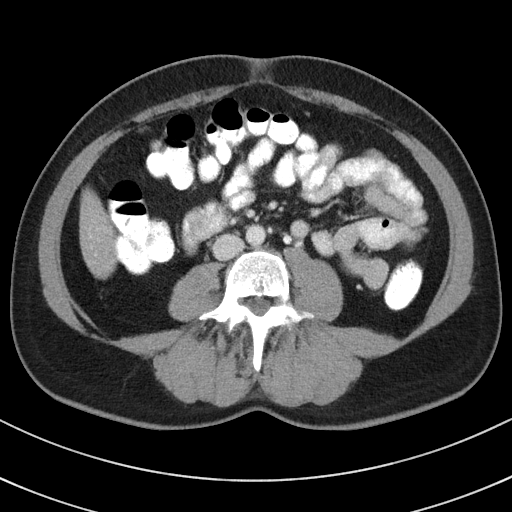
[im 48/88  soft-tissue]
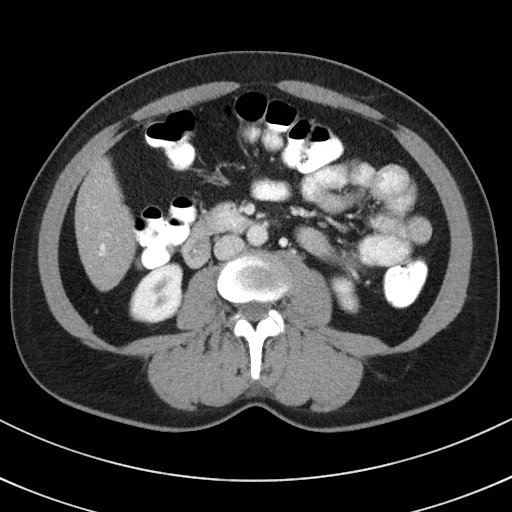
[im 57/88  soft-tissue]
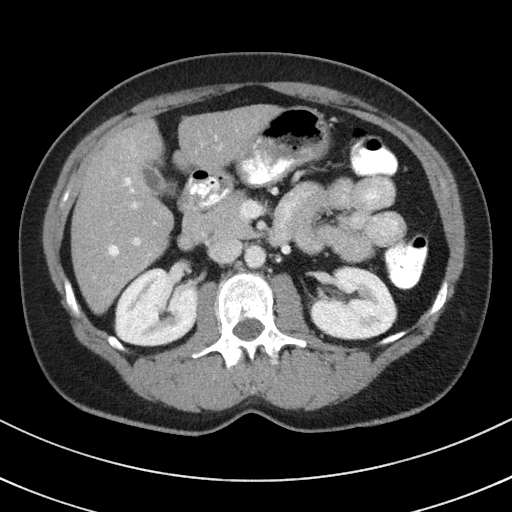
[im 57/88  bone]
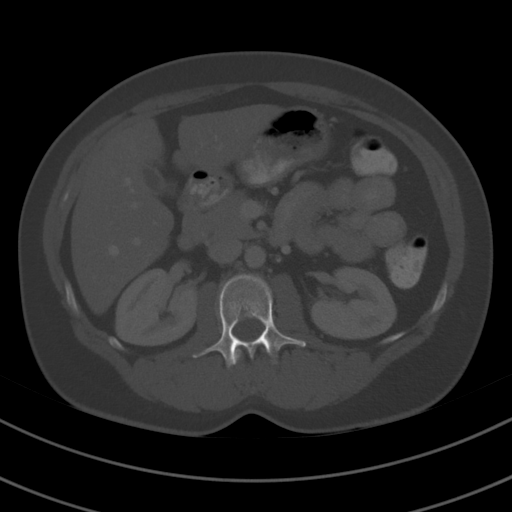
[im 61/88  soft-tissue]
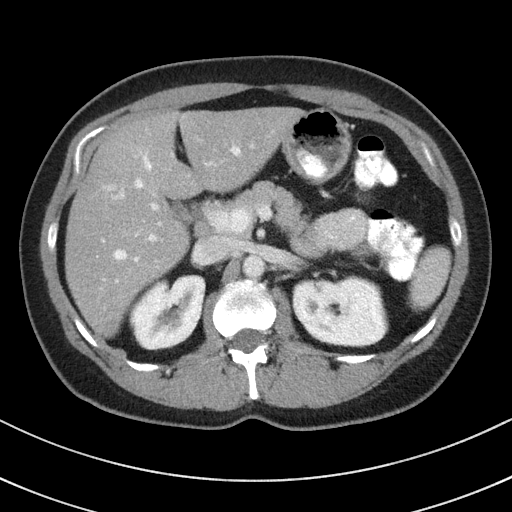
[im 70/88  soft-tissue]
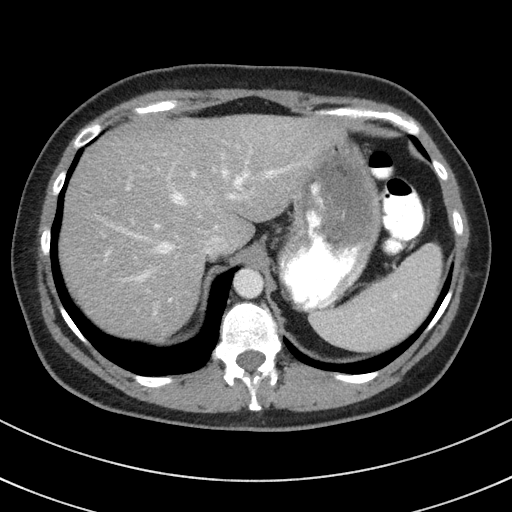
[im 74/88  soft-tissue]
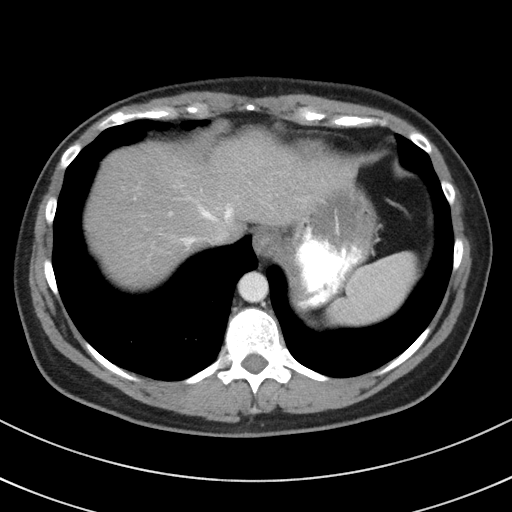
[im 83/88  soft-tissue]
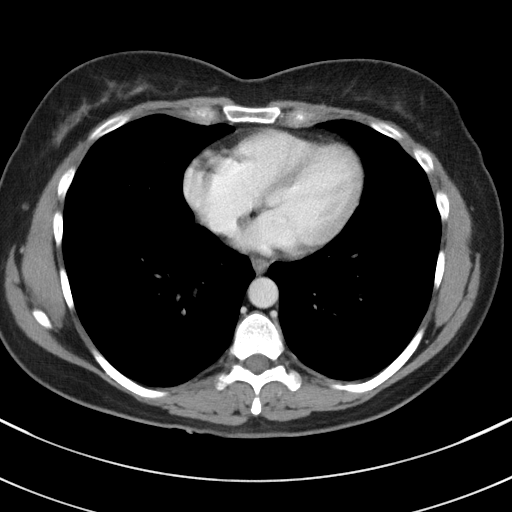

[Series 6: coronal st · coronal · 0.76mm/px · 3 of 85 slices shown]
[im 29/85  soft-tissue]
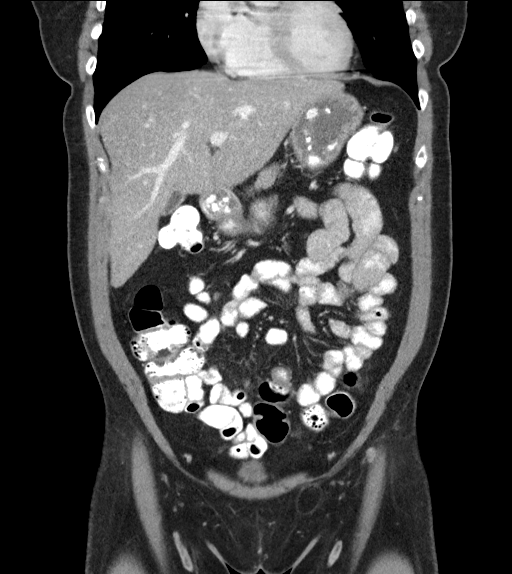
[im 38/85  soft-tissue]
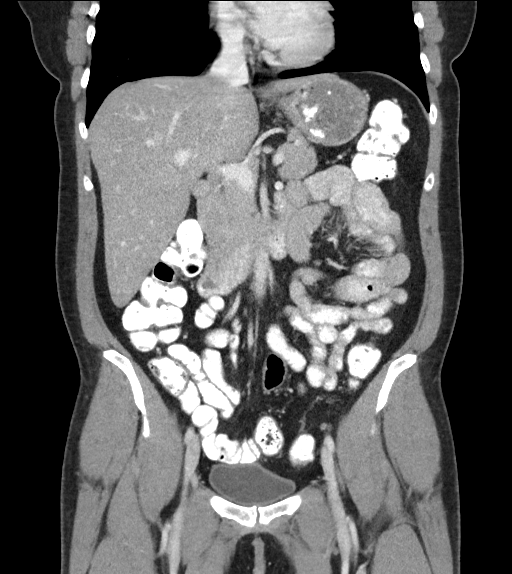
[im 47/85  soft-tissue]
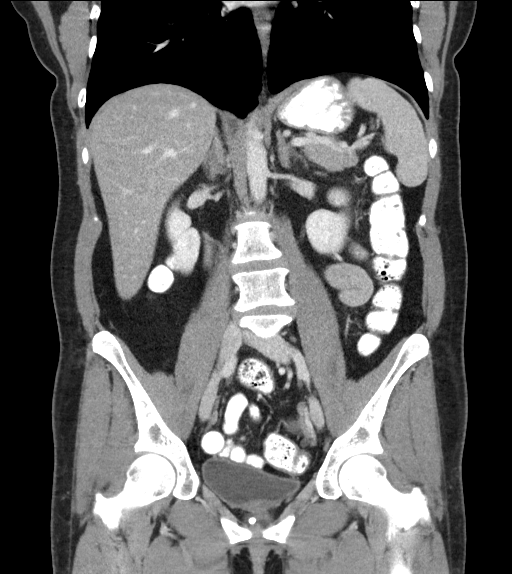

[16 of 46 positions shown; findings below may reference images not displayed]

FINDINGS: Lower chest: No acute abnormality.

Hepatobiliary: Hepatic steatosis. A tiny too small to characterize
hypodensity in the right hepatic lobe measuring approximately 5 mm
is identified. Contracted gallbladder without stones.

Pancreas: Normal

Spleen: Normal

Adrenals/Urinary Tract: Normal bilateral adrenal glands. 7 mm cyst
in the upper pole the right kidney. No enhancing renal mass. No
obstructive uropathy. Urinary bladder is unremarkable.

Stomach/Bowel: Status post appendectomy. No bowel obstruction or
inflammation. Physiologic distention of the stomach with fluid and
contrast. Normal small bowel rotation. Enteric contrast reaches the
rectum. No mural thickening of the colon.

Vascular/Lymphatic: Mild aortoiliac atherosclerosis.  No aneurysm.

Reproductive: Status post hysterectomy. Physiologic sized follicles
within both ovaries with probable corpus luteal cyst on the left
measuring 2.3 cm.

Other: Fat containing left inguinal hernia.

Musculoskeletal: L4-5 and L5-S1 facet arthropathy. No acute nor
suspicious osseous abnormality.
IMPRESSION: 1. Hepatic steatosis.
2. Subcentimeter right hepatic and right renal hypodensities, too
small to further characterize but statistically consistent with
cysts.
3. No acute bowel inflammation.
4. Probable corpus luteal cyst of the left ovary.

## 2019-02-17 DIAGNOSIS — H53413 Scotoma involving central area, bilateral: Secondary | ICD-10-CM | POA: Diagnosis not present

## 2019-03-12 ENCOUNTER — Telehealth: Payer: Self-pay | Admitting: Neurology

## 2019-03-12 ENCOUNTER — Other Ambulatory Visit: Payer: Self-pay | Admitting: *Deleted

## 2019-03-12 ENCOUNTER — Other Ambulatory Visit: Payer: Self-pay | Admitting: Neurology

## 2019-03-12 DIAGNOSIS — G43711 Chronic migraine without aura, intractable, with status migrainosus: Secondary | ICD-10-CM

## 2019-03-12 MED ORDER — NARATRIPTAN HCL 2.5 MG PO TABS: 2.5000 mg | ORAL_TABLET | ORAL | 3 refills | Status: DC | PRN

## 2019-03-12 NOTE — Telephone Encounter (Signed)
[employer] pt assistance program called stating that the pt is needing a new prescription faxed over to them. Please fax to  [phone] Fax#

## 2019-03-12 NOTE — Telephone Encounter (Signed)
error 

## 2019-03-12 NOTE — Telephone Encounter (Signed)
Noted, will have work-in MD complete prescription.

## 2019-03-13 DIAGNOSIS — E782 Mixed hyperlipidemia: Secondary | ICD-10-CM | POA: Diagnosis not present

## 2019-03-13 DIAGNOSIS — E7800 Pure hypercholesterolemia, unspecified: Secondary | ICD-10-CM | POA: Diagnosis not present

## 2019-03-13 DIAGNOSIS — E559 Vitamin D deficiency, unspecified: Secondary | ICD-10-CM | POA: Diagnosis not present

## 2019-03-13 DIAGNOSIS — E039 Hypothyroidism, unspecified: Secondary | ICD-10-CM | POA: Diagnosis not present

## 2019-03-13 NOTE — Telephone Encounter (Signed)
Received a receipt of confirmation.

## 2019-03-13 NOTE — Telephone Encounter (Signed)
I faxed a 3 month supply prescription x 1 to [employer] signed by Dr. Felecia Shelling, work-in MD. Pt needs an appt so I have contacted her through mychart.

## 2019-03-19 DIAGNOSIS — M545 Low back pain: Secondary | ICD-10-CM | POA: Diagnosis not present

## 2019-03-19 DIAGNOSIS — E039 Hypothyroidism, unspecified: Secondary | ICD-10-CM | POA: Diagnosis not present

## 2019-03-19 DIAGNOSIS — E782 Mixed hyperlipidemia: Secondary | ICD-10-CM | POA: Diagnosis not present

## 2019-03-19 DIAGNOSIS — M542 Cervicalgia: Secondary | ICD-10-CM | POA: Diagnosis not present

## 2019-03-19 DIAGNOSIS — E559 Vitamin D deficiency, unspecified: Secondary | ICD-10-CM | POA: Diagnosis not present

## 2019-03-19 DIAGNOSIS — Z6823 Body mass index (BMI) 23.0-23.9, adult: Secondary | ICD-10-CM | POA: Diagnosis not present

## 2019-05-01 ENCOUNTER — Other Ambulatory Visit: Payer: Self-pay | Admitting: Neurology

## 2019-05-01 DIAGNOSIS — G43711 Chronic migraine without aura, intractable, with status migrainosus: Secondary | ICD-10-CM

## 2019-05-06 ENCOUNTER — Telehealth: Payer: Self-pay

## 2019-05-06 NOTE — Telephone Encounter (Signed)
Aimovig application completed by patient and fax to [employer] at [phone]. Fax once and confirmed.

## 2019-05-18 ENCOUNTER — Other Ambulatory Visit: Payer: Self-pay | Admitting: Neurology

## 2019-05-18 MED ORDER — RIZATRIPTAN BENZOATE 10 MG PO TBDP: 10.0000 mg | ORAL_TABLET | ORAL | 3 refills | Status: DC | PRN

## 2019-06-17 DIAGNOSIS — M12811 Other specific arthropathies, not elsewhere classified, right shoulder: Secondary | ICD-10-CM | POA: Diagnosis not present

## 2019-06-17 DIAGNOSIS — M7501 Adhesive capsulitis of right shoulder: Secondary | ICD-10-CM | POA: Diagnosis not present

## 2019-07-02 DIAGNOSIS — M7581 Other shoulder lesions, right shoulder: Secondary | ICD-10-CM | POA: Diagnosis not present

## 2019-07-02 DIAGNOSIS — S43491A Other sprain of right shoulder joint, initial encounter: Secondary | ICD-10-CM | POA: Diagnosis not present

## 2019-07-02 DIAGNOSIS — M19011 Primary osteoarthritis, right shoulder: Secondary | ICD-10-CM | POA: Diagnosis not present

## 2019-07-02 DIAGNOSIS — M25511 Pain in right shoulder: Secondary | ICD-10-CM | POA: Diagnosis not present

## 2019-07-07 ENCOUNTER — Ambulatory Visit: Payer: Medicare Other | Admitting: Neurology

## 2019-07-07 ENCOUNTER — Other Ambulatory Visit: Payer: Self-pay

## 2019-07-07 ENCOUNTER — Encounter: Payer: Self-pay | Admitting: Neurology

## 2019-07-07 VITALS — BP 124/86 | HR 91 | Temp 97.5°F | Ht 64.0 in | Wt 139.0 lb

## 2019-07-07 DIAGNOSIS — Z23 Encounter for immunization: Secondary | ICD-10-CM | POA: Diagnosis not present

## 2019-07-07 DIAGNOSIS — G43711 Chronic migraine without aura, intractable, with status migrainosus: Secondary | ICD-10-CM

## 2019-07-07 MED ORDER — NURTEC 75 MG PO TBDP: 75.0000 mg | ORAL_TABLET | Freq: Every day | ORAL | 6 refills | Status: DC | PRN

## 2019-07-07 MED ORDER — AIMOVIG 140 MG/ML ~~LOC~~ SOAJ: 140.0000 mg | SUBCUTANEOUS | 2 refills | Status: DC

## 2019-07-07 NOTE — Progress Notes (Signed)
[id number]    Provider:  Dr Jaynee Eagles Referring Provider: Manon Hilding, MD Primary Care Physician:  Manon Hilding, MD  CC:  Migraines  Interval history 07/07/2019:  Doing exceptionally well. Just has a migraine after the injection but pretreating with maxalt helps. She gets a migraine the next day. We discussed continuing [place], she is doing so well. We discussed acute treatment also pretreating.    Interval history 07/03/2018: She is here for follow up on chronic intractable migraines. Started on Aimovig and doing extremely well. Baseline was  daily headaches and 15 days a month or migrainous. She returns today after 4 months with excellent progress. She has failed multiple medications for years. The aimovig worked quickly. She only has a migraine the day she has an injection. Recommend taking maxalt with the injection. She has NOT HAD ONE MIGRAINE since starting. She has had several mild headaches. Discussed pre-treatment.   HPI:  [name] [name] is a 48 y.o. female here as a new referral from Dr. Quintin Alto for migraines.  Patient has had migraines for many years.  Migraines are unilateral, pulsating pounding throbbing, associated light and sound sensitivity, nausea and vomiting, movement makes it worse.  She is tried and failed multiple medications.  She has daily headaches and 15 days a month or migrainous and can last up to 24 hours and most are moderately severe or severe migraines, sitting in a dark room with no sound and no movement helps, stress and whether or triggers.  No aura.  No medication overuse.  She does have neck pain.  No other focal neurologic deficits, associated symptoms, inciting events or modifiable factors.  Medications tried: Maxalt, propranolol, Imitrex, Zofran, Flexeril, gabapentin, naproxen, Phenergan, tramadol, topiramate, nortriptyline and amitriptyline  Reviewed notes, labs and imaging from outside physicians, which showed:  Reviewed MRI of  the brain that she brought on disc MRI of the head without contrast media which showed T2 white matter changes likely incidental chronic microvascular changes which could be from migraine.  Per report her right millimeter pineal region cyst is stable when compared to January 2018 I do not have the 2018 MRI to compare against.  Personally reviewed images MRI cervical spine and agree with the following: C6-C7 disc bulging and facet hypertrophy with moderate right and mild left foraminal stenosis, C5-C6 disc bulging with mild left foraminal stenosis, no intrinsic or compressive spinal cord lesions overall mild normal for age degenerative changes per my review.  Review of Systems: Patient complains of symptoms per HPI as well as the following symptoms: Migraine. Pertinent negatives and positives per HPI. All others negative.   Social History   Socioeconomic History  . Marital status: Married    Spouse name: Legrand Como  . Number of children: 1  . Years of education: College  . Highest education level: Some college, no degree  Occupational History  . Occupation: other    Comment: Disability  Social Needs  . Financial resource strain: Not on file  . Food insecurity    Worry: Not on file    Inability: Not on file  . Transportation needs    Medical: Not on file    Non-medical: Not on file  Tobacco Use  . Smoking status: Current Every Day Smoker    Packs/day: 1.00    Years: 20.00    Pack years: 20.00    Types: Cigarettes  . Smokeless tobacco: Never Used  Substance and Sexual Activity  . Alcohol use: Never  Frequency: Never  . Drug use: Never  . Sexual activity: Not on file  Lifestyle  . Physical activity    Days per week: Not on file    Minutes per session: Not on file  . Stress: Not on file  Relationships  . Social Herbalist on phone: Not on file    Gets together: Not on file    Attends religious service: Not on file    Active member of club or organization: Not on  file    Attends meetings of clubs or organizations: Not on file    Relationship status: Not on file  . Intimate partner violence    Fear of current or ex partner: Not on file    Emotionally abused: Not on file    Physically abused: Not on file    Forced sexual activity: Not on file  Other Topics Concern  . Not on file  Social History Narrative   Patient lives at home with family.   Caffeine Use: 1 cans of soda daily   Right handed    Family History  Problem Relation Age of Onset  . Other Father        degenerative disc disease  . Heart attack Father   . Diabetes Brother   . Breast cancer Sister   . Uterine cancer Maternal Grandmother   . Esophageal cancer Paternal Uncle   . Bladder Cancer Paternal Uncle   . Bladder Cancer Paternal Uncle   . Migraines Other        father's side     Past Medical History:  Diagnosis Date  . Anxiety   . 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 Graves disease   . High cholesterol   . High cholesterol   . Macular degeneration, dry   . Migraine   . Stargardt's disease   . Visual disorder     Past Surgical History:  Procedure Laterality Date  . APPENDECTOMY  08-04-2004  . OTHER SURGICAL HISTORY  12-13-2006   fatty tumor removed  . PARTIAL HYSTERECTOMY  08-04-2004    Current Outpatient Medications  Medication Sig Dispense Refill  . ALPRAZolam (XANAX) 0.5 MG tablet Take 0.5 mg by mouth 3 (three) times daily as needed for anxiety.     . dicyclomine (BENTYL) 10 MG capsule Take 1 capsule (10 mg total) by mouth 2 (two) times daily before a meal. (Patient taking differently: Take 10 mg by mouth daily. ) 60 capsule 3  . Erenumab-aooe (AIMOVIG) 140 MG/ML SOAJ Inject 140 mg into the skin every 30 (thirty) days. 3 pen 2  . ergocalciferol (VITAMIN D2) 50000 units capsule Take 50,000 Units by mouth once a week.    . levothyroxine (SYNTHROID, LEVOTHROID) 100 MCG tablet Take 100 mcg by mouth daily before breakfast.     . Omega-3 Fatty  Acids (FISH OIL) 1000 MG CAPS Take 2,100 mg by mouth 3 (three) times daily.    . ondansetron (ZOFRAN-ODT) 4 MG disintegrating tablet Take 1 tablet (4 mg total) by mouth every 8 (eight) hours as needed for nausea or vomiting. Max 30 per month. 90 tablet 3  . propranolol ER (INDERAL LA) 160 MG SR capsule Take 160 mg by mouth daily.    . rizatriptan (MAXALT-MLT) 10 MG disintegrating tablet Take 1 tablet (10 mg total) by mouth as needed for up to 1 dose for migraine. May repeat in 2 hours if needed. Max 2 tabs in 24 hours. Conrad  tablet 3  . Rimegepant Sulfate (NURTEC) 75 MG TBDP Take 75 mg by mouth daily as needed. For migraines. Take as close to onset of migraine as possible. One daily maximum. 10 tablet 6   No current facility-administered medications for this visit.     Allergies as of 07/07/2019 - Review Complete 07/07/2019  Allergen Reaction Noted  . Prednisone  05/21/2008    Vitals: BP 124/86 (BP Location: Left Arm, Patient Position: Sitting)   Pulse 91   Temp (!) 97.5 F (36.4 C) Comment: taken at front door  Ht 5\' 4"  (1.626 m)   Wt 139 lb (63 kg)   BMI 23.86 kg/m  Last Weight:  Wt Readings from Last 1 Encounters:  07/07/19 139 lb (63 kg)   Last Height:   Ht Readings from Last 1 Encounters:  07/07/19 5\' 4"  (1.626 m)    Physical exam: Exam: Gen: NAD, conversant, well nourised, well groomed                     CV: RRR, no MRG. No Carotid Bruits. No peripheral edema, warm, nontender Eyes: Conjunctivae clear without exudates or hemorrhage  Neuro: Detailed Neurologic Exam  Speech:    Speech is normal; fluent and spontaneous with normal comprehension.  Cognition:    The patient is oriented to person, place, and time;     recent and remote memory intact;     language fluent;     normal attention, concentration,     fund of knowledge Cranial Nerves:    The pupils are equal, round, and reactive to light. The fundi are normal and spontaneous venous pulsations are present.  Visual fields are full to finger confrontation. Extraocular movements are intact. Trigeminal sensation is intact and the muscles of mastication are normal. The face is symmetric. The palate elevates in the midline. Hearing intact. Voice is normal. Shoulder shrug is normal. The tongue has normal motion without fasciculations.   Coordination:    Normal finger to nose and heel to shin. Normal rapid alternating movements.   Gait:    Heel-toe and tandem gait are normal.   Motor Observation:    No asymmetry, no atrophy, and no involuntary movements noted. Tone:    Normal muscle tone.    Posture:    Posture is normal. normal erect    Strength:    Strength is V/V in the upper and lower limbs.      Sensation: intact to LT     Reflex Exam:  DTR's:    Deep tendon reflexes in the upper and lower extremities are normal bilaterally.   Toes:    The toes are downgoing bilaterally.   Clonus:    Clonus is absent.      Assessment/Plan: This is a patient with chronic migraines he is tried and failed multiple medications for many years, no medication overuse, no aura, no signs or symptoms of sleep apnea, neurologic exam normal.  EXCEPTIONAL response not one migraine except when she uses the injection. Suggested pretreating with maxalt and alleve as she only gets migraines the day of the shot and the day after her aimovig injections. Try Nurtec as well.   Continue maxalt and zofran for acute management and for nausea  RTC in one year  Meds ordered this encounter  Medications  . Rimegepant Sulfate (NURTEC) 75 MG TBDP    Sig: Take 75 mg by mouth daily as needed. For migraines. Take as close to onset of migraine as possible. One  daily maximum.    Dispense:  10 tablet    Refill:  6    Patient has copay card; she can have medication  regardless of insurance approval or copay amount.  Eduard Roux (AIMOVIG) 140 MG/ML SOAJ    Sig: Inject 140 mg into the skin every 30 (thirty) days.     Dispense:  3 pen    Refill:  2     Discussed: To prevent or relieve headaches, try the following: Cool Compress. Lie down and place a cool compress on your head.  Avoid headache triggers. If certain foods or odors seem to have triggered your migraines in the past, avoid them. A headache diary might help you identify triggers.  Include physical activity in your daily routine. Try a daily walk or other moderate aerobic exercise.  Manage stress. Find healthy ways to cope with the stressors, such as delegating tasks on your to-do list.  Practice relaxation techniques. Try deep breathing, yoga, massage and visualization.  Eat regularly. Eating regularly scheduled meals and maintaining a healthy diet might help prevent headaches. Also, drink plenty of fluids.  Follow a regular sleep schedule. Sleep deprivation might contribute to headaches Consider biofeedback. With this mind-body technique, you learn to control certain bodily functions - such as muscle tension, heart rate and blood pressure - to prevent headaches or reduce headache pain.    Proceed to emergency room if you experience new or worsening symptoms or symptoms do not resolve, if you have new neurologic symptoms or if headache is severe, or for any concerning symptom.   Provided education and documentation from American headache Society toolbox including articles on: chronic migraine medication overuse headache, chronic migraines, prevention of migraines, behavioral and other nonpharmacologic treatments for headache.  A total of 15 minutes was spent face-to-face with this patient. Over half this time was spent on counseling patient on the  1. Chronic migraine without aura, with intractable migraine, so stated, with status migrainosus    diagnosis and different diagnostic and therapeutic options, counseling and coordination of care, risks ans benefits of management, compliance, or risk factor reduction and education.        Sarina Ill, MD  [facility] [address] [place], [place] [address]  Phone [phone] Fax [phone]

## 2019-07-07 NOTE — Patient Instructions (Signed)
Rimegepant: Patient drug information Access [employer] Online here. Copyright [phone] [employer] All rights reserved. (For additional information see "Rimegepant: Drug information") Brand Names: [place]  Nurtec  What is this drug used for?   It is used to treat migraine headaches.  What do I need to tell my doctor BEFORE I take this drug?   If you are allergic to this drug; any part of this drug; or any other drugs, foods, or substances. Tell your doctor about the allergy and what signs you had.   If you have any of these health problems: Kidney disease or liver disease.   If you take any drugs (prescription or OTC, natural products, vitamins) that must not be taken with this drug, like certain drugs that are used for HIV, infections, or seizures. There are many drugs that must not be taken with this drug.   This is not a list of all drugs or health problems that interact with this drug.   Tell your doctor and pharmacist about all of your drugs (prescription or OTC, natural products, vitamins) and health problems. You must check to make sure that it is safe for you to take this drug with all of your drugs and health problems. Do not start, stop, or change the dose of any drug without checking with your doctor.  What are some things I need to know or do while I take this drug?   Tell all of your health care providers that you take this drug. This includes your doctors, nurses, pharmacists, and dentists.   This drug is not meant to prevent or lower the number of migraine headaches you get.   Tell your doctor if you are pregnant, plan on getting pregnant, or are breast-feeding. You will need to talk about the benefits and risks to you and the baby.  What are some side effects that I need to call my doctor about right away?   WARNING/CAUTION: Even though it may be rare, some people may have very bad and sometimes deadly side effects when taking a drug. Tell your doctor or get medical help right  away if you have any of the following signs or symptoms that may be related to a very bad side effect:   Signs of an allergic reaction, like rash; hives; itching; red, swollen, blistered, or peeling skin with or without fever; wheezing; tightness in the chest or throat; trouble breathing, swallowing, or talking; unusual hoarseness; or swelling of the mouth, face, lips, tongue, or throat.  What are some other side effects of this drug?   All drugs may cause side effects. However, many people have no side effects or only have minor side effects. Call your doctor or get medical help if any of these side effects or any other side effects bother you or do not go away:   Upset stomach.   These are not all of the side effects that may occur. If you have questions about side effects, call your doctor. Call your doctor for medical advice about side effects.   You may report side effects to your national health agency.  How is this drug best taken?   Use this drug as ordered by your doctor. Read all information given to you. Follow all instructions closely.   Do not push the tablet out of the foil when opening. Use dry hands to take it from the foil. Place on your tongue and let it dissolve. Water is not needed. Do not swallow it whole. Do  not chew, break, or crush it.   If needed, you may place the tablet under the tongue.   Use right after opening.  What do I do if I miss a dose?   This drug is taken on an as needed basis. Do not take more often than told by the doctor.  How do I store and/or throw out this drug?   Store at room temperature in a dry place. Do not store in a bathroom.   Store in foil pouch until ready for use.   Keep all drugs in a safe place. Keep all drugs out of the reach of children and pets.   Throw away unused or expired drugs. Do not flush down a toilet or pour down a drain unless you are told to do so. Check with your pharmacist if you have questions about the best way to throw  out drugs. There may be drug take-back programs in your area.  General drug facts   If your symptoms or health problems do not get better or if they become worse, call your doctor.   Do not share your drugs with others and do not take anyone else's drugs.   Some drugs may have another patient information leaflet. If you have any questions about this drug, please talk with your doctor, nurse, pharmacist, or other health care provider.   If you think there has been an overdose, call your poison control center or get medical care right away. Be ready to tell or show what was taken, how much, and when it happened.

## 2019-07-09 DIAGNOSIS — M12811 Other specific arthropathies, not elsewhere classified, right shoulder: Secondary | ICD-10-CM | POA: Diagnosis not present

## 2019-07-09 DIAGNOSIS — S46011D Strain of muscle(s) and tendon(s) of the rotator cuff of right shoulder, subsequent encounter: Secondary | ICD-10-CM | POA: Diagnosis not present

## 2019-07-14 ENCOUNTER — Telehealth: Payer: Self-pay | Admitting: *Deleted

## 2019-07-14 NOTE — Telephone Encounter (Signed)
aimovig 140 mg signed prescription refill x 1 year faxed to [employer]. Received a receipt of confirmation.

## 2019-07-14 NOTE — Telephone Encounter (Signed)
Completed Aimovig renewal PA on CMM. Key: [id number]. Approved immediately.   Request Reference Number: [id number]. AIMOVIG INJ 140MG /ML is approved through 08/06/2020. For further questions, call [phone].   I faxed a copy of the approval to [employer]. Received a receipt of confirmation. Sent pt a Pharmacist, community message with update.

## 2019-07-15 NOTE — Telephone Encounter (Signed)
Received approval notice from [employer]. Pt approved for free Aimovig (shipped to her) through 08/06/2020.   Patient ID # [id number]

## 2019-07-16 ENCOUNTER — Telehealth: Payer: Self-pay | Admitting: *Deleted

## 2019-07-16 MED ORDER — NURTEC 75 MG PO TBDP: 75.0000 mg | ORAL_TABLET | Freq: Every day | ORAL | 0 refills | Status: DC | PRN

## 2019-07-16 NOTE — Telephone Encounter (Signed)
Per Mychart message, pt asked for husband Legrand Como (on [employer]) to come and pick up the Nurtec samples for her. Husband came by and license verified (per [employer]). I gave him the Nurtec samples. He understands there is a medication sheet inside with the instructions inside for the patient. He verbalized appreciation.

## 2019-07-22 DIAGNOSIS — E782 Mixed hyperlipidemia: Secondary | ICD-10-CM | POA: Diagnosis not present

## 2019-07-22 DIAGNOSIS — Z720 Tobacco use: Secondary | ICD-10-CM | POA: Diagnosis not present

## 2019-07-22 DIAGNOSIS — E7800 Pure hypercholesterolemia, unspecified: Secondary | ICD-10-CM | POA: Diagnosis not present

## 2019-07-22 DIAGNOSIS — E039 Hypothyroidism, unspecified: Secondary | ICD-10-CM | POA: Diagnosis not present

## 2019-07-22 DIAGNOSIS — E559 Vitamin D deficiency, unspecified: Secondary | ICD-10-CM | POA: Diagnosis not present

## 2019-07-24 DIAGNOSIS — E039 Hypothyroidism, unspecified: Secondary | ICD-10-CM | POA: Diagnosis not present

## 2019-07-24 DIAGNOSIS — E559 Vitamin D deficiency, unspecified: Secondary | ICD-10-CM | POA: Diagnosis not present

## 2019-07-24 DIAGNOSIS — R002 Palpitations: Secondary | ICD-10-CM | POA: Diagnosis not present

## 2019-07-24 DIAGNOSIS — Z6822 Body mass index (BMI) 22.0-22.9, adult: Secondary | ICD-10-CM | POA: Diagnosis not present

## 2019-07-24 DIAGNOSIS — M542 Cervicalgia: Secondary | ICD-10-CM | POA: Diagnosis not present

## 2019-07-24 DIAGNOSIS — E782 Mixed hyperlipidemia: Secondary | ICD-10-CM | POA: Diagnosis not present

## 2019-08-06 DIAGNOSIS — S46011D Strain of muscle(s) and tendon(s) of the rotator cuff of right shoulder, subsequent encounter: Secondary | ICD-10-CM | POA: Diagnosis not present

## 2019-08-08 HISTORY — PX: SHOULDER SURGERY: SHX246

## 2019-08-12 DIAGNOSIS — Z01818 Encounter for other preprocedural examination: Secondary | ICD-10-CM | POA: Diagnosis not present

## 2019-08-12 DIAGNOSIS — E039 Hypothyroidism, unspecified: Secondary | ICD-10-CM | POA: Diagnosis not present

## 2019-08-12 DIAGNOSIS — S46811A Strain of other muscles, fascia and tendons at shoulder and upper arm level, right arm, initial encounter: Secondary | ICD-10-CM | POA: Diagnosis not present

## 2019-08-12 DIAGNOSIS — K589 Irritable bowel syndrome without diarrhea: Secondary | ICD-10-CM | POA: Diagnosis not present

## 2019-08-12 DIAGNOSIS — M25811 Other specified joint disorders, right shoulder: Secondary | ICD-10-CM | POA: Diagnosis not present

## 2019-08-12 DIAGNOSIS — M75121 Complete rotator cuff tear or rupture of right shoulder, not specified as traumatic: Secondary | ICD-10-CM | POA: Diagnosis not present

## 2019-08-14 DIAGNOSIS — M25811 Other specified joint disorders, right shoulder: Secondary | ICD-10-CM | POA: Diagnosis not present

## 2019-08-14 DIAGNOSIS — M75121 Complete rotator cuff tear or rupture of right shoulder, not specified as traumatic: Secondary | ICD-10-CM | POA: Diagnosis not present

## 2019-08-14 DIAGNOSIS — M75101 Unspecified rotator cuff tear or rupture of right shoulder, not specified as traumatic: Secondary | ICD-10-CM | POA: Diagnosis not present

## 2019-08-14 DIAGNOSIS — S46811A Strain of other muscles, fascia and tendons at shoulder and upper arm level, right arm, initial encounter: Secondary | ICD-10-CM | POA: Diagnosis not present

## 2019-08-14 DIAGNOSIS — K589 Irritable bowel syndrome without diarrhea: Secondary | ICD-10-CM | POA: Diagnosis not present

## 2019-08-14 DIAGNOSIS — E039 Hypothyroidism, unspecified: Secondary | ICD-10-CM | POA: Diagnosis not present

## 2019-08-25 DIAGNOSIS — S46011D Strain of muscle(s) and tendon(s) of the rotator cuff of right shoulder, subsequent encounter: Secondary | ICD-10-CM | POA: Diagnosis not present

## 2019-08-27 DIAGNOSIS — M25511 Pain in right shoulder: Secondary | ICD-10-CM | POA: Diagnosis not present

## 2019-08-27 DIAGNOSIS — S46011D Strain of muscle(s) and tendon(s) of the rotator cuff of right shoulder, subsequent encounter: Secondary | ICD-10-CM | POA: Diagnosis not present

## 2019-08-29 DIAGNOSIS — M25511 Pain in right shoulder: Secondary | ICD-10-CM | POA: Diagnosis not present

## 2019-08-29 DIAGNOSIS — S46011D Strain of muscle(s) and tendon(s) of the rotator cuff of right shoulder, subsequent encounter: Secondary | ICD-10-CM | POA: Diagnosis not present

## 2019-09-02 DIAGNOSIS — M25511 Pain in right shoulder: Secondary | ICD-10-CM | POA: Diagnosis not present

## 2019-09-02 DIAGNOSIS — S46011D Strain of muscle(s) and tendon(s) of the rotator cuff of right shoulder, subsequent encounter: Secondary | ICD-10-CM | POA: Diagnosis not present

## 2019-09-05 DIAGNOSIS — S46011D Strain of muscle(s) and tendon(s) of the rotator cuff of right shoulder, subsequent encounter: Secondary | ICD-10-CM | POA: Diagnosis not present

## 2019-09-05 DIAGNOSIS — M25511 Pain in right shoulder: Secondary | ICD-10-CM | POA: Diagnosis not present

## 2019-09-09 DIAGNOSIS — M25511 Pain in right shoulder: Secondary | ICD-10-CM | POA: Diagnosis not present

## 2019-09-09 DIAGNOSIS — S46011D Strain of muscle(s) and tendon(s) of the rotator cuff of right shoulder, subsequent encounter: Secondary | ICD-10-CM | POA: Diagnosis not present

## 2019-09-12 DIAGNOSIS — M25511 Pain in right shoulder: Secondary | ICD-10-CM | POA: Diagnosis not present

## 2019-09-12 DIAGNOSIS — S46011D Strain of muscle(s) and tendon(s) of the rotator cuff of right shoulder, subsequent encounter: Secondary | ICD-10-CM | POA: Diagnosis not present

## 2019-09-16 DIAGNOSIS — M25511 Pain in right shoulder: Secondary | ICD-10-CM | POA: Diagnosis not present

## 2019-09-16 DIAGNOSIS — S46011D Strain of muscle(s) and tendon(s) of the rotator cuff of right shoulder, subsequent encounter: Secondary | ICD-10-CM | POA: Diagnosis not present

## 2019-09-30 DIAGNOSIS — M25511 Pain in right shoulder: Secondary | ICD-10-CM | POA: Diagnosis not present

## 2019-09-30 DIAGNOSIS — S46011A Strain of muscle(s) and tendon(s) of the rotator cuff of right shoulder, initial encounter: Secondary | ICD-10-CM | POA: Diagnosis not present

## 2019-09-30 DIAGNOSIS — M19011 Primary osteoarthritis, right shoulder: Secondary | ICD-10-CM | POA: Diagnosis not present

## 2019-09-30 DIAGNOSIS — S46011D Strain of muscle(s) and tendon(s) of the rotator cuff of right shoulder, subsequent encounter: Secondary | ICD-10-CM | POA: Diagnosis not present

## 2019-10-02 ENCOUNTER — Other Ambulatory Visit: Payer: Self-pay

## 2019-10-02 NOTE — Patient Outreach (Signed)
[place] [facility]) Care Management  10/02/2019  [name] Apr 28, 1971 [id number]   Medication Adherence call to Mrs. [name] [name] [employer] Verify spoke with patient she is past due on Rosuvastatin 5 mg,patient explain she has stop taking this medication because she had shoulder surgery and does not know if this medication will working for her,she said she is on trial for this medication to see if it will work for her,she explain she has try other medications for cholesterol and had side effects,patient will go over it with her doctor. Mrs. Ohlsen is showing past due under [place].  [place] Management Direct Dial [phone]  Fax [phone] [email or web address]

## 2019-10-07 DIAGNOSIS — Z01818 Encounter for other preprocedural examination: Secondary | ICD-10-CM | POA: Diagnosis not present

## 2019-10-09 DIAGNOSIS — M75101 Unspecified rotator cuff tear or rupture of right shoulder, not specified as traumatic: Secondary | ICD-10-CM | POA: Diagnosis not present

## 2019-10-09 DIAGNOSIS — S46211A Strain of muscle, fascia and tendon of other parts of biceps, right arm, initial encounter: Secondary | ICD-10-CM | POA: Diagnosis not present

## 2019-10-09 DIAGNOSIS — M7521 Bicipital tendinitis, right shoulder: Secondary | ICD-10-CM | POA: Diagnosis not present

## 2019-10-09 DIAGNOSIS — G8918 Other acute postprocedural pain: Secondary | ICD-10-CM | POA: Diagnosis not present

## 2019-10-09 DIAGNOSIS — Z9889 Other specified postprocedural states: Secondary | ICD-10-CM | POA: Diagnosis not present

## 2019-10-09 DIAGNOSIS — M75121 Complete rotator cuff tear or rupture of right shoulder, not specified as traumatic: Secondary | ICD-10-CM | POA: Diagnosis not present

## 2019-10-17 DIAGNOSIS — G8918 Other acute postprocedural pain: Secondary | ICD-10-CM | POA: Diagnosis not present

## 2019-10-17 DIAGNOSIS — S46011D Strain of muscle(s) and tendon(s) of the rotator cuff of right shoulder, subsequent encounter: Secondary | ICD-10-CM | POA: Diagnosis not present

## 2019-10-20 DIAGNOSIS — M75121 Complete rotator cuff tear or rupture of right shoulder, not specified as traumatic: Secondary | ICD-10-CM | POA: Diagnosis not present

## 2019-11-11 DIAGNOSIS — R946 Abnormal results of thyroid function studies: Secondary | ICD-10-CM | POA: Diagnosis not present

## 2019-11-11 DIAGNOSIS — E559 Vitamin D deficiency, unspecified: Secondary | ICD-10-CM | POA: Diagnosis not present

## 2019-11-11 DIAGNOSIS — Z720 Tobacco use: Secondary | ICD-10-CM | POA: Diagnosis not present

## 2019-11-11 DIAGNOSIS — E782 Mixed hyperlipidemia: Secondary | ICD-10-CM | POA: Diagnosis not present

## 2019-11-13 DIAGNOSIS — E559 Vitamin D deficiency, unspecified: Secondary | ICD-10-CM | POA: Diagnosis not present

## 2019-11-13 DIAGNOSIS — R002 Palpitations: Secondary | ICD-10-CM | POA: Diagnosis not present

## 2019-11-13 DIAGNOSIS — E039 Hypothyroidism, unspecified: Secondary | ICD-10-CM | POA: Diagnosis not present

## 2019-11-13 DIAGNOSIS — E782 Mixed hyperlipidemia: Secondary | ICD-10-CM | POA: Diagnosis not present

## 2019-11-13 DIAGNOSIS — M75101 Unspecified rotator cuff tear or rupture of right shoulder, not specified as traumatic: Secondary | ICD-10-CM | POA: Diagnosis not present

## 2019-11-13 DIAGNOSIS — Z1389 Encounter for screening for other disorder: Secondary | ICD-10-CM | POA: Diagnosis not present

## 2019-11-17 DIAGNOSIS — Z4789 Encounter for other orthopedic aftercare: Secondary | ICD-10-CM | POA: Diagnosis not present

## 2019-11-17 DIAGNOSIS — S46001D Unspecified injury of muscle(s) and tendon(s) of the rotator cuff of right shoulder, subsequent encounter: Secondary | ICD-10-CM | POA: Diagnosis not present

## 2019-12-19 DIAGNOSIS — M75101 Unspecified rotator cuff tear or rupture of right shoulder, not specified as traumatic: Secondary | ICD-10-CM | POA: Diagnosis not present

## 2020-01-26 DIAGNOSIS — S46001D Unspecified injury of muscle(s) and tendon(s) of the rotator cuff of right shoulder, subsequent encounter: Secondary | ICD-10-CM | POA: Diagnosis not present

## 2020-02-12 NOTE — Telephone Encounter (Signed)
[employer] product replacement prescription verification form for one Aimovig syringe replacement completed, signed, and faxed to [facility]. Received a receipt of confirmation.

## 2020-02-24 DIAGNOSIS — S71051A Open bite, right hip, initial encounter: Secondary | ICD-10-CM | POA: Diagnosis not present

## 2020-02-24 DIAGNOSIS — Z6822 Body mass index (BMI) 22.0-22.9, adult: Secondary | ICD-10-CM | POA: Diagnosis not present

## 2020-04-09 DIAGNOSIS — G43711 Chronic migraine without aura, intractable, with status migrainosus: Secondary | ICD-10-CM

## 2020-04-13 MED ORDER — AIMOVIG 140 MG/ML ~~LOC~~ SOAJ: 140.0000 mg | SUBCUTANEOUS | 1 refills | Status: DC

## 2020-04-23 DIAGNOSIS — Z6822 Body mass index (BMI) 22.0-22.9, adult: Secondary | ICD-10-CM | POA: Diagnosis not present

## 2020-04-23 DIAGNOSIS — M79661 Pain in right lower leg: Secondary | ICD-10-CM | POA: Diagnosis not present

## 2020-04-23 DIAGNOSIS — I8390 Asymptomatic varicose veins of unspecified lower extremity: Secondary | ICD-10-CM | POA: Diagnosis not present

## 2020-04-26 NOTE — Telephone Encounter (Signed)
Application printed for physician portion completion.

## 2020-04-27 DIAGNOSIS — I82811 Embolism and thrombosis of superficial veins of right lower extremities: Secondary | ICD-10-CM | POA: Diagnosis not present

## 2020-04-27 DIAGNOSIS — M79604 Pain in right leg: Secondary | ICD-10-CM | POA: Diagnosis not present

## 2020-04-28 NOTE — Telephone Encounter (Signed)
Environmental manager prescription completed, signed by MD. Application and prescription faxed to [employer]. Received a receipt of confirmation.

## 2020-05-05 ENCOUNTER — Telehealth: Payer: Self-pay | Admitting: Neurology

## 2020-05-05 NOTE — Telephone Encounter (Signed)
It's too early to do a PA for 2022. I have placed a reminder to do one closer to the end of the year.

## 2020-05-05 NOTE — Telephone Encounter (Signed)
Sports coach [name] [name]) called renewal application free drug for Erenumab-aooe (AIMOVIG) 140 MG/ML SOAJ next year. Could you start PA for the following year? Fax number: [phone].

## 2020-05-17 DIAGNOSIS — E782 Mixed hyperlipidemia: Secondary | ICD-10-CM | POA: Diagnosis not present

## 2020-05-17 DIAGNOSIS — Z0001 Encounter for general adult medical examination with abnormal findings: Secondary | ICD-10-CM | POA: Diagnosis not present

## 2020-05-17 DIAGNOSIS — E039 Hypothyroidism, unspecified: Secondary | ICD-10-CM | POA: Diagnosis not present

## 2020-05-17 DIAGNOSIS — E876 Hypokalemia: Secondary | ICD-10-CM | POA: Diagnosis not present

## 2020-05-17 DIAGNOSIS — Z6823 Body mass index (BMI) 23.0-23.9, adult: Secondary | ICD-10-CM | POA: Diagnosis not present

## 2020-05-17 DIAGNOSIS — E7800 Pure hypercholesterolemia, unspecified: Secondary | ICD-10-CM | POA: Diagnosis not present

## 2020-05-17 DIAGNOSIS — E559 Vitamin D deficiency, unspecified: Secondary | ICD-10-CM | POA: Diagnosis not present

## 2020-05-17 DIAGNOSIS — R946 Abnormal results of thyroid function studies: Secondary | ICD-10-CM | POA: Diagnosis not present

## 2020-05-17 DIAGNOSIS — Z720 Tobacco use: Secondary | ICD-10-CM | POA: Diagnosis not present

## 2020-05-19 DIAGNOSIS — E782 Mixed hyperlipidemia: Secondary | ICD-10-CM | POA: Diagnosis not present

## 2020-05-19 DIAGNOSIS — M542 Cervicalgia: Secondary | ICD-10-CM | POA: Diagnosis not present

## 2020-05-19 DIAGNOSIS — E559 Vitamin D deficiency, unspecified: Secondary | ICD-10-CM | POA: Diagnosis not present

## 2020-05-19 DIAGNOSIS — E039 Hypothyroidism, unspecified: Secondary | ICD-10-CM | POA: Diagnosis not present

## 2020-05-19 DIAGNOSIS — R002 Palpitations: Secondary | ICD-10-CM | POA: Diagnosis not present

## 2020-06-05 DIAGNOSIS — E039 Hypothyroidism, unspecified: Secondary | ICD-10-CM | POA: Diagnosis not present

## 2020-06-05 DIAGNOSIS — E7849 Other hyperlipidemia: Secondary | ICD-10-CM | POA: Diagnosis not present

## 2020-06-05 DIAGNOSIS — G43519 Persistent migraine aura without cerebral infarction, intractable, without status migrainosus: Secondary | ICD-10-CM | POA: Diagnosis not present

## 2020-06-05 DIAGNOSIS — E876 Hypokalemia: Secondary | ICD-10-CM | POA: Diagnosis not present

## 2020-06-08 MED ORDER — RIZATRIPTAN BENZOATE 10 MG PO TBDP: 10.0000 mg | ORAL_TABLET | ORAL | 0 refills | Status: DC | PRN

## 2020-06-25 DIAGNOSIS — M545 Low back pain, unspecified: Secondary | ICD-10-CM | POA: Diagnosis not present

## 2020-06-25 DIAGNOSIS — M25522 Pain in left elbow: Secondary | ICD-10-CM | POA: Diagnosis not present

## 2020-06-25 DIAGNOSIS — M25572 Pain in left ankle and joints of left foot: Secondary | ICD-10-CM | POA: Diagnosis not present

## 2020-06-25 DIAGNOSIS — M2550 Pain in unspecified joint: Secondary | ICD-10-CM | POA: Diagnosis not present

## 2020-06-25 DIAGNOSIS — M25552 Pain in left hip: Secondary | ICD-10-CM | POA: Diagnosis not present

## 2020-06-25 DIAGNOSIS — M25562 Pain in left knee: Secondary | ICD-10-CM | POA: Diagnosis not present

## 2020-06-27 DIAGNOSIS — G43711 Chronic migraine without aura, intractable, with status migrainosus: Secondary | ICD-10-CM

## 2020-06-28 DIAGNOSIS — M79661 Pain in right lower leg: Secondary | ICD-10-CM | POA: Diagnosis not present

## 2020-06-28 DIAGNOSIS — M79604 Pain in right leg: Secondary | ICD-10-CM | POA: Diagnosis not present

## 2020-06-28 DIAGNOSIS — M2550 Pain in unspecified joint: Secondary | ICD-10-CM | POA: Diagnosis not present

## 2020-06-28 NOTE — Telephone Encounter (Signed)
Completed Aimovig PA on Cover My Meds for 2022. Key: [id number]. Awaiting determination from [facility].

## 2020-06-29 NOTE — Telephone Encounter (Signed)
Late entry from 06/28/20. [employer] approved Aimovig 140 mg through 08/06/2021. I faxed the approval notice along with the Aimovig application to [employer]. Received a receipt of confirmation.

## 2020-07-06 DIAGNOSIS — E876 Hypokalemia: Secondary | ICD-10-CM | POA: Diagnosis not present

## 2020-07-06 DIAGNOSIS — E039 Hypothyroidism, unspecified: Secondary | ICD-10-CM | POA: Diagnosis not present

## 2020-07-06 DIAGNOSIS — E7849 Other hyperlipidemia: Secondary | ICD-10-CM | POA: Diagnosis not present

## 2020-07-07 ENCOUNTER — Encounter: Payer: Self-pay | Admitting: Family Medicine

## 2020-07-07 ENCOUNTER — Ambulatory Visit: Payer: Medicare Other | Admitting: Family Medicine

## 2020-07-07 VITALS — BP 127/90 | HR 91 | Ht 64.0 in | Wt 138.0 lb

## 2020-07-07 DIAGNOSIS — G43711 Chronic migraine without aura, intractable, with status migrainosus: Secondary | ICD-10-CM

## 2020-07-07 MED ORDER — ONDANSETRON 4 MG PO TBDP: 4.0000 mg | ORAL_TABLET | Freq: Three times a day (TID) | ORAL | 3 refills | Status: DC | PRN

## 2020-07-07 MED ORDER — RIZATRIPTAN BENZOATE 10 MG PO TBDP: 10.0000 mg | ORAL_TABLET | ORAL | 3 refills | Status: DC | PRN

## 2020-07-07 NOTE — Patient Instructions (Addendum)
Below is our plan:  We will continue Amovig injections every 30 days. We will continue rizatriptan and ondasetron for abortive therapy.   Please make sure you are staying well hydrated. I recommend 50-60 ounces daily. Well balanced diet and regular exercise encouraged.    Please continue follow up with care team as directed.   Follow up in 1 year   You may receive a survey regarding today's visit. I encourage you to leave honest feed back as I do use this information to improve patient care. Thank you for seeing me today!      Migraine Headache A migraine headache is a very strong throbbing pain on one side or both sides of your head. This type of headache can also cause other symptoms. It can last from 4 hours to 3 days. Talk with your doctor about what things may bring on (trigger) this condition. What are the causes? The exact cause of this condition is not known. This condition may be triggered or caused by:  Drinking alcohol.  Smoking.  Taking medicines, such as: ? Medicine used to treat chest pain (nitroglycerin). ? Birth control pills. ? Estrogen. ? Some blood pressure medicines.  Eating or drinking certain products.  Doing physical activity. Other things that may trigger a migraine headache include:  Having a menstrual period.  Pregnancy.  Hunger.  Stress.  Not getting enough sleep or getting too much sleep.  Weather changes.  Tiredness (fatigue). What increases the risk?  Being 70-90 years old.  Being female.  Having a family history of migraine headaches.  Being Caucasian.  Having depression or anxiety.  Being very overweight. What are the signs or symptoms?  A throbbing pain. This pain may: ? Happen in any area of the head, such as on one side or both sides. ? Make it hard to do daily activities. ? Get worse with physical activity. ? Get worse around bright lights or loud noises.  Other symptoms may include: ? Feeling sick to your  stomach (nauseous). ? Vomiting. ? Dizziness. ? Being sensitive to bright lights, loud noises, or smells.  Before you get a migraine headache, you may get warning signs (an aura). An aura may include: ? Seeing flashing lights or having blind spots. ? Seeing bright spots, halos, or zigzag lines. ? Having tunnel vision or blurred vision. ? Having numbness or a tingling feeling. ? Having trouble talking. ? Having weak muscles.  Some people have symptoms after a migraine headache (postdromal phase), such as: ? Tiredness. ? Trouble thinking (concentrating). How is this treated?  Taking medicines that: ? Relieve pain. ? Relieve the feeling of being sick to your stomach. ? Prevent migraine headaches.  Treatment may also include: ? Having acupuncture. ? Avoiding foods that bring on migraine headaches. ? Learning ways to control your body functions (biofeedback). ? Therapy to help you know and deal with negative thoughts (cognitive behavioral therapy). Follow these instructions at home: Medicines  Take over-the-counter and prescription medicines only as told by your doctor.  Ask your doctor if the medicine prescribed to you: ? Requires you to avoid driving or using heavy machinery. ? Can cause trouble pooping (constipation). You may need to take these steps to prevent or treat trouble pooping:  Drink enough fluid to keep your pee (urine) pale yellow.  Take over-the-counter or prescription medicines.  Eat foods that are high in fiber. These include beans, whole grains, and fresh fruits and vegetables.  Limit foods that are high in fat and  sugar. These include fried or sweet foods. Lifestyle  Do not drink alcohol.  Do not use any products that contain nicotine or tobacco, such as cigarettes, e-cigarettes, and chewing tobacco. If you need help quitting, ask your doctor.  Get at least 8 hours of sleep every night.  Limit and deal with stress. General instructions      Keep  a journal to find out what may bring on your migraine headaches. For example, write down: ? What you eat and drink. ? How much sleep you get. ? Any change in what you eat or drink. ? Any change in your medicines.  If you have a migraine headache: ? Avoid things that make your symptoms worse, such as bright lights. ? It may help to lie down in a dark, quiet room. ? Do not drive or use heavy machinery. ? Ask your doctor what activities are safe for you.  Keep all follow-up visits as told by your doctor. This is important. Contact a doctor if:  You get a migraine headache that is different or worse than others you have had.  You have more than 15 headache days in one month. Get help right away if:  Your migraine headache gets very bad.  Your migraine headache lasts longer than 72 hours.  You have a fever.  You have a stiff neck.  You have trouble seeing.  Your muscles feel weak or like you cannot control them.  You start to lose your balance a lot.  You start to have trouble walking.  You pass out (faint).  You have a seizure. Summary  A migraine headache is a very strong throbbing pain on one side or both sides of your head. These headaches can also cause other symptoms.  This condition may be treated with medicines and changes to your lifestyle.  Keep a journal to find out what may bring on your migraine headaches.  Contact a doctor if you get a migraine headache that is different or worse than others you have had.  Contact your doctor if you have more than 15 headache days in a month. This information is not intended to replace advice given to you by your health care provider. Make sure you discuss any questions you have with your health care provider. Document Revised: 11/15/2018 Document Reviewed: 09/05/2018 Elsevier Patient Education  [place].

## 2020-07-07 NOTE — Progress Notes (Addendum)
Chief Complaint  Patient presents with  . Follow-up    [facility]  . Migraine    pt said her migraines are much better withthe Aimovig.      HISTORY OF PRESENT ILLNESS: Today 07/07/20  [name] [name] is a 49 y.o. female here today for follow up for migraines. She continues Amovig monthly and ondansetron, rizatriptan as needed for abortive therapy. She is getting Amovig through [facility]. 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 No migraines over the past three months. She is also taking propranolol LA 160mg  daily for tachycardia associated with Graves. She is feeling well, today and without complaints.    HISTORY (copied from previous note)  Interval history 07/07/2019:  Doing exceptionally well. Just has a migraine after the injection but pretreating with maxalt helps. She gets a migraine the next day. We discussed continuing [place], she is doing so well. We discussed acute treatment also pretreating.   Interval history 07/03/2018: She is here for follow up on chronic intractable migraines. Started on Aimovig and doing extremely well. Baseline was  daily headaches and 15 days a month or migrainous. She returns today after 4 months with excellent progress. She has failed multiple medications for years. The aimovig worked quickly. She only has a migraine the day she has an injection. Recommend taking maxalt with the injection. She has NOT HAD ONE MIGRAINE since starting. She has had several mild headaches. Discussed pre-treatment.   HPI:  [name] [name] is a 49 y.o. female here as a new referral from Dr. Quintin Alto for migraines.  Patient has had migraines for many years.  Migraines are unilateral, pulsating pounding throbbing, associated light and sound sensitivity, nausea and vomiting, movement makes it worse.  She is tried and failed multiple medications.  She has daily headaches and 15 days a month or migrainous and can last up to 24 hours and most are  moderately severe or severe migraines, sitting in a dark room with no sound and no movement helps, stress and whether or triggers.  No aura.  No medication overuse.  She does have neck pain.  No other focal neurologic deficits, associated symptoms, inciting events or modifiable factors.  Medications tried: Maxalt, propranolol, Imitrex, Zofran, Flexeril, gabapentin, naproxen, Phenergan, tramadol, topiramate, nortriptyline and amitriptyline  Reviewed notes, labs and imaging from outside physicians, which showed:  Reviewed MRI of the brain that she brought on disc MRI of the head without contrast media which showed T2 white matter changes likely incidental chronic microvascular changes which could be from migraine.  Per report her right millimeter pineal region cyst is stable when compared to January 2018 I do not have the 2018 MRI to compare against.  Personally reviewed images MRI cervical spine and agree with the following: C6-C7 disc bulging and facet hypertrophy with moderate right and mild left foraminal stenosis, C5-C6 disc bulging with mild left foraminal stenosis, no intrinsic or compressive spinal cord lesions overall mild normal for age degenerative changes per my review.    REVIEW OF SYSTEMS: Out of a complete 14 system review of symptoms, the patient complains only of the following symptoms, migraines, right shoulder pain and all other reviewed systems are negative.   ALLERGIES: Allergies  Allergen Reactions  . Prednisone     In pill form causes "extreme palpitations" but patient can take injection form     HOME MEDICATIONS: Outpatient Medications Prior to Visit  Medication Sig Dispense Refill  . ALPRAZolam Duanne Moron)  0.5 MG tablet Take 0.5 mg by mouth 3 (three) times daily as needed for anxiety.     . dicyclomine (BENTYL) 10 MG capsule Take 1 capsule (10 mg total) by mouth 2 (two) times daily before a meal. (Patient taking differently: Take 10 mg by mouth daily. ) 60 capsule 3    . Erenumab-aooe (AIMOVIG) 140 MG/ML SOAJ Inject 140 mg into the skin every 30 (thirty) days. 3 mL 1  . ergocalciferol (VITAMIN D2) 50000 units capsule Take 50,000 Units by mouth once a week.    . levothyroxine (SYNTHROID, LEVOTHROID) 100 MCG tablet Take 100 mcg by mouth daily before breakfast.     . Omega-3 Fatty Acids (FISH OIL) 1000 MG CAPS Take 2,100 mg by mouth 3 (three) times daily.    . propranolol ER (INDERAL LA) 160 MG SR capsule Take 160 mg by mouth daily.    . ondansetron (ZOFRAN-ODT) 4 MG disintegrating tablet Take 1 tablet (4 mg total) by mouth every 8 (eight) hours as needed for nausea or vomiting. Max 30 per month. 90 tablet 3  . Rimegepant Sulfate (NURTEC) 75 MG TBDP Take 75 mg by mouth daily as needed. For migraines. Take as close to onset of migraine as possible. One daily maximum. 10 tablet 6  . rizatriptan (MAXALT-MLT) 10 MG disintegrating tablet Take 1 tablet (10 mg total) by mouth as needed for up to 1 dose for migraine. May repeat in 2 hours if needed. Max 2 tabs in 24 hours. 27 tablet 0   No facility-administered medications prior to visit.     PAST MEDICAL HISTORY: Past Medical History:  Diagnosis Date  . Anxiety   . 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 Graves disease   . High cholesterol   . High cholesterol   . Macular degeneration, dry   . Migraine   . Stargardt's disease   . Visual disorder      PAST SURGICAL HISTORY: Past Surgical History:  Procedure Laterality Date  . APPENDECTOMY  08-04-2004  . OTHER SURGICAL HISTORY  12-13-2006   fatty tumor removed  . PARTIAL HYSTERECTOMY  08-04-2004     FAMILY HISTORY: Family History  Problem Relation Age of Onset  . Other Father        degenerative disc disease  . Heart attack Father   . Diabetes Brother   . Breast cancer Sister   . Uterine cancer Maternal Grandmother   . Esophageal cancer Paternal Uncle   . Bladder Cancer Paternal Uncle   . Bladder Cancer Paternal Uncle   .  Migraines Other        father's side      SOCIAL HISTORY: Social History   Socioeconomic History  . Marital status: Married    Spouse name: Legrand Como  . Number of children: 1  . Years of education: College  . Highest education level: Some college, no degree  Occupational History  . Occupation: other    Comment: Disability  Tobacco Use  . Smoking status: Current Every Day Smoker    Packs/day: 1.00    Years: 20.00    Pack years: 20.00    Types: Cigarettes  . Smokeless tobacco: Never Used  Vaping Use  . Vaping Use: Never used  Substance and Sexual Activity  . Alcohol use: Never  . Drug use: Never  . Sexual activity: Not on file  Other Topics Concern  . Not on file  Social History Narrative   Patient lives at home  with family.   Caffeine Use: 1 cans of soda daily   Right handed   Social Determinants of Health   Financial Resource Strain:   . Difficulty of Paying Living Expenses: Not on file  Food Insecurity:   . Worried About Charity fundraiser in the Last Year: Not on file  . Ran Out of Food in the Last Year: Not on file  Transportation Needs:   . Lack of Transportation (Medical): Not on file  . Lack of Transportation (Non-Medical): Not on file  Physical Activity:   . Days of Exercise per Week: Not on file  . Minutes of Exercise per Session: Not on file  Stress:   . Feeling of Stress : Not on file  Social Connections:   . Frequency of Communication with Friends and Family: Not on file  . Frequency of Social Gatherings with Friends and Family: Not on file  . Attends Religious Services: Not on file  . Active Member of Clubs or Organizations: Not on file  . Attends Archivist Meetings: Not on file  . Marital Status: Not on file  Intimate Partner Violence:   . Fear of Current or Ex-Partner: Not on file  . Emotionally Abused: Not on file  . Physically Abused: Not on file  . Sexually Abused: Not on file      PHYSICAL EXAM  Vitals:   07/07/20 0914  07/07/20 0917  BP: (!) 129/91 127/90  Pulse: 87 91  Weight: 138 lb (62.6 kg)   Height: 5\' 4"  (1.626 m)    Body mass index is 23.69 kg/m.   Generalized: Well developed, in no acute distress  Cardiology: normal rate and rhythm, no murmur auscultated  Respiratory: clear to auscultation bilaterally    Neurological examination  Mentation: Alert oriented to time, place, history taking. Follows all commands speech and language fluent Cranial nerve II-XII: Pupils were equal round reactive to light. Extraocular movements were full, visual field were full on confrontational test. Facial sensation and strength were normal. Head turning and shoulder shrug  were normal and symmetric. Motor: The motor testing reveals 5 over 5 strength of all 4 extremities. Good symmetric motor tone is noted throughout.  Sensory: Sensory testing is intact to soft touch on all 4 extremities. No evidence of extinction is noted.  Coordination: Cerebellar testing reveals good finger-nose-finger and heel-to-shin bilaterally.  Gait and station: Gait is normal.  Reflexes: Deep tendon reflexes are symmetric and normal bilaterally.     DIAGNOSTIC DATA (LABS, IMAGING, TESTING) - I reviewed patient records, labs, notes, testing and imaging myself where available.  Lab Results  Component Value Date   WBC 13.4 (H) 05/14/2017   HGB 15.6 (H) 05/14/2017   HCT 45.0 05/14/2017   MCV 89.8 05/14/2017   PLT 330 05/14/2017      Component Value Date/Time   NA 138 08/30/2007 1627   K 4.0 08/30/2007 1627   CL 105 08/30/2007 1627   CO2 27 08/30/2007 1627   GLUCOSE 101 (H) 08/30/2007 1627   BUN 4 (L) 08/30/2007 1627   CREATININE 0.8 08/30/2007 1627   CALCIUM 9.4 08/30/2007 1627   PROT 6.4 08/30/2007 1627   ALBUMIN 3.7 08/30/2007 1627   AST 27 08/30/2007 1627   ALT 33 08/30/2007 1627   ALKPHOS 95 08/30/2007 1627   BILITOT 0.5 08/30/2007 1627   GFRNONAA 86 08/30/2007 1627   GFRAA 104 08/30/2007 1627   No results found  for: CHOL, HDL, LDLCALC, LDLDIRECT, TRIG, CHOLHDL No results  found for: HGBA1C No results found for: VITAMINB12 Lab Results  Component Value Date   TSH 1.02 01/01/2014      ASSESSMENT AND PLAN  49 y.o. year old female  has a past medical history of Anxiety, Bulging lumbar disc, Graves disease, High cholesterol, High cholesterol, Macular degeneration, dry, Migraine, Stargardt's disease, and Visual disorder. here with   Chronic migraine without aura, with intractable migraine, so stated, with status migrainosus  [name] [name] is doing very well on Aimovig monthly and rizatriptan and ondansetron as needed.  We will continue current treatment plan.  Healthy lifestyle habits encouraged.  She will follow-up with [place] in 1 year, sooner if needed.  She verbalizes understanding and agreement with this plan.   I spent 20 minutes of face-to-face and non-face-to-face time with patient.  This included previsit chart review, lab review, study review, order entry, electronic health record documentation, patient education.    Debbora Presto, MSN, FNP-C 07/07/2020, 9:50 AM  [facility] [address], [place], [place] [address] [phone]  Made any corrections needed, and agree with history, physical, neuro exam,assessment and plan as stated.     Sarina Ill, MD [facility]

## 2020-07-09 DIAGNOSIS — M25562 Pain in left knee: Secondary | ICD-10-CM | POA: Diagnosis not present

## 2020-07-09 DIAGNOSIS — M25572 Pain in left ankle and joints of left foot: Secondary | ICD-10-CM | POA: Diagnosis not present

## 2020-07-09 DIAGNOSIS — M25522 Pain in left elbow: Secondary | ICD-10-CM | POA: Diagnosis not present

## 2020-07-09 DIAGNOSIS — M25561 Pain in right knee: Secondary | ICD-10-CM | POA: Diagnosis not present

## 2020-07-09 DIAGNOSIS — M25552 Pain in left hip: Secondary | ICD-10-CM | POA: Diagnosis not present

## 2020-07-09 DIAGNOSIS — Z6823 Body mass index (BMI) 23.0-23.9, adult: Secondary | ICD-10-CM | POA: Diagnosis not present

## 2020-07-09 DIAGNOSIS — Z23 Encounter for immunization: Secondary | ICD-10-CM | POA: Diagnosis not present

## 2020-07-12 MED ORDER — AIMOVIG 140 MG/ML ~~LOC~~ SOAJ: 140.0000 mg | SUBCUTANEOUS | 3 refills | Status: DC

## 2020-07-12 NOTE — Addendum Note (Signed)
Addended by: Gildardo Griffes on: 07/12/2020 12:11 PM   Modules accepted: Orders

## 2020-07-12 NOTE — Telephone Encounter (Signed)
Prescription was included with the application but [employer] has faxed [place] stating they need an Rx for Aimovig. This time Rx was e-scribed to [facility].

## 2020-07-29 ENCOUNTER — Telehealth: Payer: Self-pay | Admitting: *Deleted

## 2020-07-29 NOTE — Telephone Encounter (Signed)
Completed renewal PA for Aimovig for 2022. Key: [id number]. Awaiting determination from [facility].

## 2020-07-29 NOTE — Telephone Encounter (Signed)
This particular PA was canceled by [facility] as pt already has approval on file through 08/06/21.

## 2020-08-06 DIAGNOSIS — E039 Hypothyroidism, unspecified: Secondary | ICD-10-CM | POA: Diagnosis not present

## 2020-08-06 DIAGNOSIS — E7849 Other hyperlipidemia: Secondary | ICD-10-CM | POA: Diagnosis not present

## 2020-08-06 DIAGNOSIS — E876 Hypokalemia: Secondary | ICD-10-CM | POA: Diagnosis not present

## 2020-08-06 DIAGNOSIS — G43519 Persistent migraine aura without cerebral infarction, intractable, without status migrainosus: Secondary | ICD-10-CM | POA: Diagnosis not present

## 2020-08-10 NOTE — Telephone Encounter (Signed)
[employer] faxed approval notice to our office. Pt eligible for free Aimovig through 08/06/2021. Her case ID is [id number].

## 2020-08-16 ENCOUNTER — Encounter: Payer: Self-pay | Admitting: Family Medicine

## 2020-08-17 DIAGNOSIS — Z720 Tobacco use: Secondary | ICD-10-CM | POA: Diagnosis not present

## 2020-08-17 DIAGNOSIS — I252 Old myocardial infarction: Secondary | ICD-10-CM | POA: Diagnosis not present

## 2020-08-17 DIAGNOSIS — R9431 Abnormal electrocardiogram [ECG] [EKG]: Secondary | ICD-10-CM | POA: Diagnosis not present

## 2020-08-17 DIAGNOSIS — I10 Essential (primary) hypertension: Secondary | ICD-10-CM | POA: Diagnosis not present

## 2020-08-17 DIAGNOSIS — F17210 Nicotine dependence, cigarettes, uncomplicated: Secondary | ICD-10-CM | POA: Diagnosis not present

## 2020-08-17 DIAGNOSIS — R079 Chest pain, unspecified: Secondary | ICD-10-CM | POA: Diagnosis not present

## 2020-08-17 DIAGNOSIS — F32A Depression, unspecified: Secondary | ICD-10-CM | POA: Diagnosis not present

## 2020-08-17 DIAGNOSIS — R072 Precordial pain: Secondary | ICD-10-CM | POA: Diagnosis not present

## 2020-08-17 NOTE — Telephone Encounter (Signed)
aimovig 140 mg rx faxed to [employer]. #3 w/ 3 refills. Monthly injection. Received a receipt of confirmation.

## 2020-08-20 DIAGNOSIS — Z6823 Body mass index (BMI) 23.0-23.9, adult: Secondary | ICD-10-CM | POA: Diagnosis not present

## 2020-08-20 DIAGNOSIS — R253 Fasciculation: Secondary | ICD-10-CM | POA: Diagnosis not present

## 2020-08-20 DIAGNOSIS — R079 Chest pain, unspecified: Secondary | ICD-10-CM | POA: Diagnosis not present

## 2020-08-20 DIAGNOSIS — R002 Palpitations: Secondary | ICD-10-CM | POA: Diagnosis not present

## 2020-09-06 DIAGNOSIS — L600 Ingrowing nail: Secondary | ICD-10-CM | POA: Diagnosis not present

## 2020-09-06 DIAGNOSIS — E876 Hypokalemia: Secondary | ICD-10-CM | POA: Diagnosis not present

## 2020-09-06 DIAGNOSIS — G43519 Persistent migraine aura without cerebral infarction, intractable, without status migrainosus: Secondary | ICD-10-CM | POA: Diagnosis not present

## 2020-09-06 DIAGNOSIS — E7849 Other hyperlipidemia: Secondary | ICD-10-CM | POA: Diagnosis not present

## 2020-09-06 DIAGNOSIS — E039 Hypothyroidism, unspecified: Secondary | ICD-10-CM | POA: Diagnosis not present

## 2020-09-10 DIAGNOSIS — E559 Vitamin D deficiency, unspecified: Secondary | ICD-10-CM | POA: Diagnosis not present

## 2020-09-10 DIAGNOSIS — Z720 Tobacco use: Secondary | ICD-10-CM | POA: Diagnosis not present

## 2020-09-10 DIAGNOSIS — E7800 Pure hypercholesterolemia, unspecified: Secondary | ICD-10-CM | POA: Diagnosis not present

## 2020-09-10 DIAGNOSIS — R946 Abnormal results of thyroid function studies: Secondary | ICD-10-CM | POA: Diagnosis not present

## 2020-09-10 DIAGNOSIS — E7849 Other hyperlipidemia: Secondary | ICD-10-CM | POA: Diagnosis not present

## 2020-09-10 DIAGNOSIS — E782 Mixed hyperlipidemia: Secondary | ICD-10-CM | POA: Diagnosis not present

## 2020-09-13 ENCOUNTER — Encounter: Payer: Self-pay | Admitting: Cardiology

## 2020-09-13 ENCOUNTER — Encounter: Payer: Self-pay | Admitting: *Deleted

## 2020-09-13 ENCOUNTER — Ambulatory Visit: Payer: Medicare Other | Admitting: Cardiology

## 2020-09-13 VITALS — BP 130/90 | HR 62 | Ht 65.0 in | Wt 141.8 lb

## 2020-09-13 DIAGNOSIS — R0789 Other chest pain: Secondary | ICD-10-CM

## 2020-09-13 NOTE — Patient Instructions (Signed)
Medication Instructions:   Your physician recommends that you continue on your current medications as directed. Please refer to the Current Medication list given to you today.  Labwork:  Covid test 2-3 days before your stress test. Please quarantine after covid test until stress test is completed.   Testing/Procedures: Your physician has requested that you have an exercise tolerance test. For further information please visit [email or web address]. Please also follow instruction sheet, as given.  Follow-Up:  Your physician recommends that you schedule a follow-up appointment in: pending.   Any Other Special Instructions Will Be Listed Below (If Applicable).  If you need a refill on your cardiac medications before your next appointment, please call your pharmacy.

## 2020-09-13 NOTE — Progress Notes (Signed)
Cardiology Office Note  Date: 09/13/2020   ID: LOUVINA CLEARY, DOB 1971-06-30, MRN [id number]  PCP:  Manon Hilding, MD  Cardiologist:  Rozann Lesches, MD Electrophysiologist:  None   Chief Complaint  Patient presents with  . History of chest pain    History of Present Illness: [name] [name] is a 50 y.o. female referred for cardiology consultation by Dr. Quintin Alto for the evaluation of chest pain.  She describes a dull, fairly constant, left sternal discomfort that began back in January, no obvious precipitant or worsening with activity.  She was seen in the ER at [facility] on January 11 for evaluation of symptoms.  Chest x-ray showed no acute abnormalities, ECG was nonspecific, normal BNP and high-sensitivity troponin I levels also noted.  She states that since this time symptoms have improved gradually, not completely resolved.  Her father has heart disease, she has a personal history of hypertriglyceridemia, also hypothyroidism.  I reviewed her medications which are outlined below.  Past Medical History:  Diagnosis Date  . Anxiety   . Bulging lumbar disc    Cervical and thoracic  . Graves disease   . Hyperlipidemia   . Macular degeneration   . Migraine     Past Surgical History:  Procedure Laterality Date  . APPENDECTOMY  2005  . LIPOMA RESECTION  2008  . PARTIAL HYSTERECTOMY  2005  . SHOULDER SURGERY  2021    Current Outpatient Medications  Medication Sig Dispense Refill  . ALPRAZolam (XANAX) 0.5 MG tablet Take 0.5 mg by mouth 3 (three) times daily as needed for anxiety.     Marland Kitchen aspirin EC 81 MG tablet Take 81 mg by mouth daily. Swallow whole.    Eduard Roux (AIMOVIG) 140 MG/ML SOAJ Inject 140 mg into the skin every 30 (thirty) days. 3 mL 3  . ergocalciferol (VITAMIN D2) 50000 units capsule Take 50,000 Units by mouth once a week.    . fluticasone (FLONASE) 50 MCG/ACT nasal spray Place 2 sprays into both nostrils daily.    Marland Kitchen levothyroxine (SYNTHROID,  LEVOTHROID) 100 MCG tablet Take 100 mcg by mouth daily before breakfast.     . Omega-3 Fatty Acids (FISH OIL) 1000 MG CAPS Take 2,100 mg by mouth 3 (three) times daily.    . ondansetron (ZOFRAN-ODT) 4 MG disintegrating tablet Take 1 tablet (4 mg total) by mouth every 8 (eight) hours as needed for nausea or vomiting. Max 30 per month. 90 tablet 3  . propranolol ER (INDERAL LA) 160 MG SR capsule Take 160 mg by mouth daily.    . rizatriptan (MAXALT-MLT) 10 MG disintegrating tablet Take 1 tablet (10 mg total) by mouth as needed for up to 1 dose for migraine. May repeat in 2 hours if needed. Max 2 tabs in 24 hours. 27 tablet 3   No current facility-administered medications for this visit.   Allergies:  Prednisone   Social History: The patient  reports that she has been smoking cigarettes. She has a 20.00 pack-year smoking history. She has never used smokeless tobacco. She reports that she does not drink alcohol and does not use drugs.   Family History: The patient's family history includes Bladder Cancer in her paternal uncle and paternal uncle; Breast cancer in her sister; Diabetes in her brother; Esophageal cancer in her paternal uncle; Heart attack in her father; Hypertension in her father and mother; Hypothyroidism in her mother; Uterine cancer in her maternal grandmother.   ROS: No palpitations or syncope.  Physical Exam: VS:  BP 130/90   Pulse 62   Ht 5\' 5"  (1.651 m)   Wt 141 lb 12.8 oz (64.3 kg)   BMI 23.60 kg/m , BMI Body mass index is 23.6 kg/m.  Wt Readings from Last 3 Encounters:  09/13/20 141 lb 12.8 oz (64.3 kg)  07/07/20 138 lb (62.6 kg)  07/07/19 139 lb (63 kg)    General: Patient appears comfortable at rest. HEENT: Conjunctiva and lids normal, wearing a mask. Neck: Supple, no elevated JVP or carotid bruits, no thyromegaly. Lungs: Clear to auscultation, nonlabored breathing at rest. Cardiac: Regular rate and rhythm, no S3 or significant systolic murmur, no pericardial  rub. Abdomen: Soft, bowel sounds present. Extremities: No pitting edema.  ECG:  An ECG dated 10/03/2017 was personally reviewed today and demonstrated:  Normal sinus rhythm.  Recent Labwork:  October 2021: TSH 0.388, cholesterol 217, triglycerides 566, HDL 24, LDL 97, BUN 5, creatinine 0.79, potassium 4.2, AST 22, ALT 30  Other Studies Reviewed Today:  No prior cardiac testing for review today.  Assessment and Plan:  Atypical chest pain in a 50 year old woman with history of heart disease in her father, personal history of hypertriglyceridemia, and tobacco use.  Recent ER evaluation at [facility] noted in January.  Plan is to proceed with a GXT for screening evaluation.  She will need to hold Inderal LA the evening before testing.  Medication Adjustments/Labs and Tests Ordered: Current medicines are reviewed at length with the patient today.  Concerns regarding medicines are outlined above.   Tests Ordered: Orders Placed This Encounter  Procedures  . EXERCISE TOLERANCE TEST (ETT)    Medication Changes: No orders of the defined types were placed in this encounter.   Disposition:  Follow up test results.  Signed, Satira Sark, MD, [facility] 09/13/2020 11:03 AM    [facility] [place], [place], [place] [address] Phone: [phone]; Fax: [phone]

## 2020-09-16 DIAGNOSIS — E7849 Other hyperlipidemia: Secondary | ICD-10-CM | POA: Diagnosis not present

## 2020-09-16 DIAGNOSIS — E559 Vitamin D deficiency, unspecified: Secondary | ICD-10-CM | POA: Diagnosis not present

## 2020-09-16 DIAGNOSIS — Z1389 Encounter for screening for other disorder: Secondary | ICD-10-CM | POA: Diagnosis not present

## 2020-09-16 DIAGNOSIS — E039 Hypothyroidism, unspecified: Secondary | ICD-10-CM | POA: Diagnosis not present

## 2020-09-16 DIAGNOSIS — R002 Palpitations: Secondary | ICD-10-CM | POA: Diagnosis not present

## 2020-09-16 DIAGNOSIS — R079 Chest pain, unspecified: Secondary | ICD-10-CM | POA: Diagnosis not present

## 2020-09-16 DIAGNOSIS — T466X5A Adverse effect of antihyperlipidemic and antiarteriosclerotic drugs, initial encounter: Secondary | ICD-10-CM | POA: Diagnosis not present

## 2020-09-24 ENCOUNTER — Other Ambulatory Visit (HOSPITAL_COMMUNITY)
Admission: RE | Admit: 2020-09-24 | Discharge: 2020-09-24 | Disposition: A | Discharge status: Home / Self Care | Payer: Medicare Other | Source: Ambulatory Visit | Attending: Cardiology | Admitting: Cardiology

## 2020-09-24 ENCOUNTER — Other Ambulatory Visit: Payer: Self-pay

## 2020-09-24 DIAGNOSIS — Z20822 Contact with and (suspected) exposure to covid-19: Secondary | ICD-10-CM | POA: Diagnosis not present

## 2020-09-24 DIAGNOSIS — Z01812 Encounter for preprocedural laboratory examination: Secondary | ICD-10-CM | POA: Diagnosis not present

## 2020-09-24 LAB — SARS CORONAVIRUS 2 (TAT 6-24 HRS): SARS Coronavirus 2: NEGATIVE

## 2020-09-27 ENCOUNTER — Ambulatory Visit (HOSPITAL_COMMUNITY)
Admission: RE | Admit: 2020-09-27 | Discharge: 2020-09-27 | Disposition: A | Discharge status: Home / Self Care | Payer: Medicare Other | Source: Ambulatory Visit | Attending: Cardiology | Admitting: Cardiology

## 2020-09-27 ENCOUNTER — Other Ambulatory Visit: Payer: Self-pay

## 2020-09-27 DIAGNOSIS — R0789 Other chest pain: Secondary | ICD-10-CM | POA: Diagnosis not present

## 2020-09-27 LAB — EXERCISE TOLERANCE TEST
Estimated workload: 7 METS
Exercise duration (min): 4 min
Exercise duration (sec): 35 s
MPHR: 170 {beats}/min
Peak HR: 171 {beats}/min
Percent HR: 100 %
RPE: 15
Rest HR: 71 {beats}/min

## 2020-09-30 ENCOUNTER — Telehealth: Payer: Self-pay | Admitting: *Deleted

## 2020-09-30 NOTE — Telephone Encounter (Signed)
-----   Message from Satira Sark, MD sent at 09/27/2020  4:43 PM EST ----- Results reviewed.  Please let her know that the stress test looked good overall.  Increase in blood pressure with exercise, but no ECG changes to suggest obstructive CAD as cause of her symptoms.  For now would keep follow-up with PCP, no further cardiac testing planned.

## 2020-09-30 NOTE — Telephone Encounter (Signed)
Patient informed. Copy sent to PCP °

## 2020-10-05 ENCOUNTER — Ambulatory Visit: Payer: Medicare Other | Admitting: Cardiology

## 2020-10-11 DIAGNOSIS — Z1231 Encounter for screening mammogram for malignant neoplasm of breast: Secondary | ICD-10-CM | POA: Diagnosis not present

## 2020-10-11 NOTE — Telephone Encounter (Signed)
You might consider talking with Dr. Quintin Alto about an alternative to propranolol ER such as atenolol or Toprol-XL.  These are both medications with which he should be very familiar.

## 2020-10-11 NOTE — Telephone Encounter (Signed)
Yes, actually both of those are different forms of beta-blocker that can slow heart rate and you might tolerate better.  If he picks a low enough dose it will be less likely to affect blood pressure.

## 2020-10-14 DIAGNOSIS — E039 Hypothyroidism, unspecified: Secondary | ICD-10-CM | POA: Diagnosis not present

## 2020-10-14 DIAGNOSIS — R002 Palpitations: Secondary | ICD-10-CM | POA: Diagnosis not present

## 2020-10-14 DIAGNOSIS — K224 Dyskinesia of esophagus: Secondary | ICD-10-CM | POA: Diagnosis not present

## 2020-10-14 DIAGNOSIS — M2550 Pain in unspecified joint: Secondary | ICD-10-CM | POA: Diagnosis not present

## 2020-10-14 DIAGNOSIS — Z6822 Body mass index (BMI) 22.0-22.9, adult: Secondary | ICD-10-CM | POA: Diagnosis not present

## 2020-10-29 DIAGNOSIS — Z6824 Body mass index (BMI) 24.0-24.9, adult: Secondary | ICD-10-CM | POA: Diagnosis not present

## 2020-10-29 DIAGNOSIS — M19049 Primary osteoarthritis, unspecified hand: Secondary | ICD-10-CM | POA: Diagnosis not present

## 2020-10-29 DIAGNOSIS — M5136 Other intervertebral disc degeneration, lumbar region: Secondary | ICD-10-CM | POA: Diagnosis not present

## 2020-10-29 DIAGNOSIS — M2550 Pain in unspecified joint: Secondary | ICD-10-CM | POA: Diagnosis not present

## 2020-11-03 DIAGNOSIS — E7849 Other hyperlipidemia: Secondary | ICD-10-CM | POA: Diagnosis not present

## 2020-11-03 DIAGNOSIS — E876 Hypokalemia: Secondary | ICD-10-CM | POA: Diagnosis not present

## 2020-11-03 DIAGNOSIS — E039 Hypothyroidism, unspecified: Secondary | ICD-10-CM | POA: Diagnosis not present

## 2020-11-03 DIAGNOSIS — G43519 Persistent migraine aura without cerebral infarction, intractable, without status migrainosus: Secondary | ICD-10-CM | POA: Diagnosis not present

## 2020-11-12 DIAGNOSIS — R252 Cramp and spasm: Secondary | ICD-10-CM | POA: Diagnosis not present

## 2020-11-12 DIAGNOSIS — Z6823 Body mass index (BMI) 23.0-23.9, adult: Secondary | ICD-10-CM | POA: Diagnosis not present

## 2020-11-12 DIAGNOSIS — M797 Fibromyalgia: Secondary | ICD-10-CM | POA: Diagnosis not present

## 2020-11-16 ENCOUNTER — Encounter: Payer: Self-pay | Admitting: Family Medicine

## 2020-11-16 DIAGNOSIS — S30860A Insect bite (nonvenomous) of lower back and pelvis, initial encounter: Secondary | ICD-10-CM | POA: Diagnosis not present

## 2020-11-16 DIAGNOSIS — Z6823 Body mass index (BMI) 23.0-23.9, adult: Secondary | ICD-10-CM | POA: Diagnosis not present

## 2020-12-04 DIAGNOSIS — G43519 Persistent migraine aura without cerebral infarction, intractable, without status migrainosus: Secondary | ICD-10-CM | POA: Diagnosis not present

## 2020-12-04 DIAGNOSIS — E7849 Other hyperlipidemia: Secondary | ICD-10-CM | POA: Diagnosis not present

## 2020-12-04 DIAGNOSIS — E876 Hypokalemia: Secondary | ICD-10-CM | POA: Diagnosis not present

## 2020-12-04 DIAGNOSIS — E039 Hypothyroidism, unspecified: Secondary | ICD-10-CM | POA: Diagnosis not present

## 2021-01-03 DIAGNOSIS — E039 Hypothyroidism, unspecified: Secondary | ICD-10-CM | POA: Diagnosis not present

## 2021-01-03 DIAGNOSIS — E7849 Other hyperlipidemia: Secondary | ICD-10-CM | POA: Diagnosis not present

## 2021-01-03 DIAGNOSIS — E876 Hypokalemia: Secondary | ICD-10-CM | POA: Diagnosis not present

## 2021-01-03 DIAGNOSIS — G43519 Persistent migraine aura without cerebral infarction, intractable, without status migrainosus: Secondary | ICD-10-CM | POA: Diagnosis not present

## 2021-01-10 DIAGNOSIS — M25562 Pain in left knee: Secondary | ICD-10-CM | POA: Diagnosis not present

## 2021-01-10 DIAGNOSIS — E039 Hypothyroidism, unspecified: Secondary | ICD-10-CM | POA: Diagnosis not present

## 2021-01-10 DIAGNOSIS — E7849 Other hyperlipidemia: Secondary | ICD-10-CM | POA: Diagnosis not present

## 2021-01-10 DIAGNOSIS — E782 Mixed hyperlipidemia: Secondary | ICD-10-CM | POA: Diagnosis not present

## 2021-01-10 DIAGNOSIS — E559 Vitamin D deficiency, unspecified: Secondary | ICD-10-CM | POA: Diagnosis not present

## 2021-01-10 DIAGNOSIS — R5383 Other fatigue: Secondary | ICD-10-CM | POA: Diagnosis not present

## 2021-01-10 DIAGNOSIS — E876 Hypokalemia: Secondary | ICD-10-CM | POA: Diagnosis not present

## 2021-01-12 DIAGNOSIS — G43909 Migraine, unspecified, not intractable, without status migrainosus: Secondary | ICD-10-CM | POA: Diagnosis not present

## 2021-01-12 DIAGNOSIS — M75101 Unspecified rotator cuff tear or rupture of right shoulder, not specified as traumatic: Secondary | ICD-10-CM | POA: Diagnosis not present

## 2021-01-12 DIAGNOSIS — E7849 Other hyperlipidemia: Secondary | ICD-10-CM | POA: Diagnosis not present

## 2021-01-12 DIAGNOSIS — R079 Chest pain, unspecified: Secondary | ICD-10-CM | POA: Diagnosis not present

## 2021-01-12 DIAGNOSIS — R002 Palpitations: Secondary | ICD-10-CM | POA: Diagnosis not present

## 2021-01-12 DIAGNOSIS — E039 Hypothyroidism, unspecified: Secondary | ICD-10-CM | POA: Diagnosis not present

## 2021-01-12 DIAGNOSIS — M791 Myalgia, unspecified site: Secondary | ICD-10-CM | POA: Diagnosis not present

## 2021-01-12 DIAGNOSIS — E559 Vitamin D deficiency, unspecified: Secondary | ICD-10-CM | POA: Diagnosis not present

## 2021-01-14 DIAGNOSIS — M25562 Pain in left knee: Secondary | ICD-10-CM | POA: Diagnosis not present

## 2021-01-26 DIAGNOSIS — R300 Dysuria: Secondary | ICD-10-CM | POA: Diagnosis not present

## 2021-02-03 DIAGNOSIS — G43519 Persistent migraine aura without cerebral infarction, intractable, without status migrainosus: Secondary | ICD-10-CM | POA: Diagnosis not present

## 2021-02-03 DIAGNOSIS — E876 Hypokalemia: Secondary | ICD-10-CM | POA: Diagnosis not present

## 2021-02-03 DIAGNOSIS — E039 Hypothyroidism, unspecified: Secondary | ICD-10-CM | POA: Diagnosis not present

## 2021-02-03 DIAGNOSIS — E7849 Other hyperlipidemia: Secondary | ICD-10-CM | POA: Diagnosis not present

## 2021-03-06 DIAGNOSIS — E7849 Other hyperlipidemia: Secondary | ICD-10-CM | POA: Diagnosis not present

## 2021-03-06 DIAGNOSIS — E039 Hypothyroidism, unspecified: Secondary | ICD-10-CM | POA: Diagnosis not present

## 2021-03-06 DIAGNOSIS — G43519 Persistent migraine aura without cerebral infarction, intractable, without status migrainosus: Secondary | ICD-10-CM | POA: Diagnosis not present

## 2021-03-06 DIAGNOSIS — E876 Hypokalemia: Secondary | ICD-10-CM | POA: Diagnosis not present

## 2021-04-01 DIAGNOSIS — S83242A Other tear of medial meniscus, current injury, left knee, initial encounter: Secondary | ICD-10-CM | POA: Diagnosis not present

## 2021-04-01 DIAGNOSIS — M25562 Pain in left knee: Secondary | ICD-10-CM | POA: Diagnosis not present

## 2021-04-01 DIAGNOSIS — M7122 Synovial cyst of popliteal space [Baker], left knee: Secondary | ICD-10-CM | POA: Diagnosis not present

## 2021-04-01 DIAGNOSIS — M1712 Unilateral primary osteoarthritis, left knee: Secondary | ICD-10-CM | POA: Diagnosis not present

## 2021-04-01 DIAGNOSIS — M25362 Other instability, left knee: Secondary | ICD-10-CM | POA: Diagnosis not present

## 2021-04-01 DIAGNOSIS — M25462 Effusion, left knee: Secondary | ICD-10-CM | POA: Diagnosis not present

## 2021-04-04 DIAGNOSIS — M25462 Effusion, left knee: Secondary | ICD-10-CM | POA: Diagnosis not present

## 2021-04-04 DIAGNOSIS — M1712 Unilateral primary osteoarthritis, left knee: Secondary | ICD-10-CM | POA: Diagnosis not present

## 2021-04-04 DIAGNOSIS — M25362 Other instability, left knee: Secondary | ICD-10-CM | POA: Diagnosis not present

## 2021-04-04 DIAGNOSIS — S83242D Other tear of medial meniscus, current injury, left knee, subsequent encounter: Secondary | ICD-10-CM | POA: Diagnosis not present

## 2021-04-04 DIAGNOSIS — M25562 Pain in left knee: Secondary | ICD-10-CM | POA: Diagnosis not present

## 2021-04-18 DIAGNOSIS — M25462 Effusion, left knee: Secondary | ICD-10-CM | POA: Diagnosis not present

## 2021-04-18 DIAGNOSIS — M25562 Pain in left knee: Secondary | ICD-10-CM | POA: Diagnosis not present

## 2021-04-18 DIAGNOSIS — S83242D Other tear of medial meniscus, current injury, left knee, subsequent encounter: Secondary | ICD-10-CM | POA: Diagnosis not present

## 2021-05-07 ENCOUNTER — Encounter: Payer: Self-pay | Admitting: Family Medicine

## 2021-05-12 NOTE — Telephone Encounter (Signed)
[employer] sent fax asking for PA info for this year for Aimovig. I tried initiating PA on CMM. Key: [id number]. Received the following response: "This medication or product was previously approved on ~[id number] from 2020-08-07 to 2021-08-06. **Please note: This request was submitted electronically. Formulary lowering, tiering exception, cost reduction and/or pre-benefit determination review (including prospective Medicare hospice reviews) requests cannot be requested using this method of submission. Providers contact us at [phone] for further assistance."  I faxed this back to [employer] at [phone]. Received fax confirmation.

## 2021-05-20 DIAGNOSIS — E7849 Other hyperlipidemia: Secondary | ICD-10-CM | POA: Diagnosis not present

## 2021-05-20 DIAGNOSIS — E782 Mixed hyperlipidemia: Secondary | ICD-10-CM | POA: Diagnosis not present

## 2021-05-20 DIAGNOSIS — E039 Hypothyroidism, unspecified: Secondary | ICD-10-CM | POA: Diagnosis not present

## 2021-05-20 DIAGNOSIS — E559 Vitamin D deficiency, unspecified: Secondary | ICD-10-CM | POA: Diagnosis not present

## 2021-05-20 DIAGNOSIS — R5383 Other fatigue: Secondary | ICD-10-CM | POA: Diagnosis not present

## 2021-05-23 DIAGNOSIS — E65 Localized adiposity: Secondary | ICD-10-CM | POA: Diagnosis not present

## 2021-05-23 DIAGNOSIS — M6752 Plica syndrome, left knee: Secondary | ICD-10-CM | POA: Diagnosis not present

## 2021-05-23 DIAGNOSIS — M25562 Pain in left knee: Secondary | ICD-10-CM | POA: Diagnosis not present

## 2021-05-23 DIAGNOSIS — M25462 Effusion, left knee: Secondary | ICD-10-CM | POA: Diagnosis not present

## 2021-05-25 DIAGNOSIS — E559 Vitamin D deficiency, unspecified: Secondary | ICD-10-CM | POA: Diagnosis not present

## 2021-05-25 DIAGNOSIS — E876 Hypokalemia: Secondary | ICD-10-CM | POA: Diagnosis not present

## 2021-05-25 DIAGNOSIS — Z23 Encounter for immunization: Secondary | ICD-10-CM | POA: Diagnosis not present

## 2021-05-25 DIAGNOSIS — T466X5A Adverse effect of antihyperlipidemic and antiarteriosclerotic drugs, initial encounter: Secondary | ICD-10-CM | POA: Diagnosis not present

## 2021-05-25 DIAGNOSIS — M75101 Unspecified rotator cuff tear or rupture of right shoulder, not specified as traumatic: Secondary | ICD-10-CM | POA: Diagnosis not present

## 2021-05-25 DIAGNOSIS — Z0001 Encounter for general adult medical examination with abnormal findings: Secondary | ICD-10-CM | POA: Diagnosis not present

## 2021-05-25 DIAGNOSIS — M791 Myalgia, unspecified site: Secondary | ICD-10-CM | POA: Diagnosis not present

## 2021-05-25 DIAGNOSIS — R002 Palpitations: Secondary | ICD-10-CM | POA: Diagnosis not present

## 2021-05-30 ENCOUNTER — Telehealth: Payer: Self-pay | Admitting: Family Medicine

## 2021-05-30 DIAGNOSIS — Z01818 Encounter for other preprocedural examination: Secondary | ICD-10-CM | POA: Diagnosis not present

## 2021-05-30 NOTE — Telephone Encounter (Signed)
Called pt back. Ok to take Aimovig as prescribed day prior to surgery. Having meniscus repaired. Pt verbalized understanding.

## 2021-05-30 NOTE — Telephone Encounter (Signed)
Pt called, having knee surgery, want to know if  can take Erenumab-aooe (AIMOVIG) 140 MG/ML SOAJ day before surgery. Would the injection interfere with surgery. Would like a call from the nurse.

## 2021-05-31 DIAGNOSIS — Z1211 Encounter for screening for malignant neoplasm of colon: Secondary | ICD-10-CM | POA: Diagnosis not present

## 2021-05-31 DIAGNOSIS — Z1212 Encounter for screening for malignant neoplasm of rectum: Secondary | ICD-10-CM | POA: Diagnosis not present

## 2021-06-07 DIAGNOSIS — Z01818 Encounter for other preprocedural examination: Secondary | ICD-10-CM | POA: Diagnosis not present

## 2021-06-08 ENCOUNTER — Encounter (INDEPENDENT_AMBULATORY_CARE_PROVIDER_SITE_OTHER): Payer: Self-pay | Admitting: *Deleted

## 2021-06-08 DIAGNOSIS — E785 Hyperlipidemia, unspecified: Secondary | ICD-10-CM | POA: Diagnosis not present

## 2021-06-08 DIAGNOSIS — M2392 Unspecified internal derangement of left knee: Secondary | ICD-10-CM | POA: Diagnosis not present

## 2021-06-08 DIAGNOSIS — M1712 Unilateral primary osteoarthritis, left knee: Secondary | ICD-10-CM | POA: Diagnosis not present

## 2021-06-08 DIAGNOSIS — M25562 Pain in left knee: Secondary | ICD-10-CM | POA: Diagnosis not present

## 2021-06-08 DIAGNOSIS — F17210 Nicotine dependence, cigarettes, uncomplicated: Secondary | ICD-10-CM | POA: Diagnosis not present

## 2021-06-08 DIAGNOSIS — M23301 Other meniscus derangements, unspecified lateral meniscus, left knee: Secondary | ICD-10-CM | POA: Diagnosis not present

## 2021-06-08 DIAGNOSIS — Z791 Long term (current) use of non-steroidal anti-inflammatories (NSAID): Secondary | ICD-10-CM | POA: Diagnosis not present

## 2021-06-08 DIAGNOSIS — M6752 Plica syndrome, left knee: Secondary | ICD-10-CM | POA: Diagnosis not present

## 2021-06-08 DIAGNOSIS — G43909 Migraine, unspecified, not intractable, without status migrainosus: Secondary | ICD-10-CM | POA: Diagnosis not present

## 2021-06-08 DIAGNOSIS — S83232A Complex tear of medial meniscus, current injury, left knee, initial encounter: Secondary | ICD-10-CM | POA: Diagnosis not present

## 2021-06-08 DIAGNOSIS — Z79899 Other long term (current) drug therapy: Secondary | ICD-10-CM | POA: Diagnosis not present

## 2021-06-08 DIAGNOSIS — Z7982 Long term (current) use of aspirin: Secondary | ICD-10-CM | POA: Diagnosis not present

## 2021-06-08 HISTORY — PX: KNEE SURGERY: SHX244

## 2021-07-06 DIAGNOSIS — E039 Hypothyroidism, unspecified: Secondary | ICD-10-CM | POA: Diagnosis not present

## 2021-07-06 DIAGNOSIS — R5383 Other fatigue: Secondary | ICD-10-CM | POA: Diagnosis not present

## 2021-07-06 NOTE — Patient Instructions (Signed)
Below is our plan:  We will use the last 4 injections of Amovig every other month. If doing well, discontinue at that time. Continue propranolol and amitriptyline as directed by PCP. Use rizatriptan for abortive therapy. If headaches worsen, we will start Emgality.   Please make sure you are staying well hydrated. I recommend 50-60 ounces daily. Well balanced diet and regular exercise encouraged. Consistent sleep schedule with 6-8 hours recommended.   Please continue follow up with care team as directed.   Follow up with me in 1 year, sooner if needed  You may receive a survey regarding today's visit. I encourage you to leave honest feed back as I do use this information to improve patient care. Thank you for seeing me today!

## 2021-07-06 NOTE — Progress Notes (Signed)
Chief Complaint  Patient presents with   Follow-up    Rm 11, alone. Here for yearly migraine f/u. Pt reports having 1 migraine a month. Pt states [employer] safety net foundation did not approve her Aimovig for the new year. Pt only has 3 refills left on Aimovig.      HISTORY OF PRESENT ILLNESS: 07/07/21 ALL: [name] [name] returns for follow up for migraines. She has been doing very well on Amovig. She was receiving through PAP but recently denied. She continues propranolol for pulse management with Graves. She was started on amitriptyline for fibromyalgia in 11/2020. She is tolerating it well and feels it works well for joint pain. She has not had very many headaches at all. Maybe 1 per month. Rizatriptan helps with abortive therapy. She has 4 Amovig injections left and wondering if she needs to switch to a different CGRP.   07/07/2020 ALL:  [name] [name] is a 50 y.o. female here today for follow up for migraines. She continues Amovig monthly and ondansetron, rizatriptan as needed for abortive therapy. She is getting Amovig through PAP. 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 No migraines over the past three months. She is also taking propranolol LA 160mg  daily for tachycardia associated with Graves. She is feeling well, today and without complaints.    HISTORY (copied from previous note)  Interval history 07/07/2019:  Doing exceptionally well. Just has a migraine after the injection but pretreating with maxalt helps. She gets a migraine the next day. We discussed continuing [place], she is doing so well. We discussed acute treatment also pretreating.    Interval history 07/03/2018: She is here for follow up on chronic intractable migraines. Started on Aimovig and doing extremely well. Baseline was  daily headaches and 15 days a month or migrainous. She returns today after 4 months with excellent progress. She has failed multiple medications for years. The aimovig  worked quickly. She only has a migraine the day she has an injection. Recommend taking maxalt with the injection. She has NOT HAD ONE MIGRAINE since starting. She has had several mild headaches. Discussed pre-treatment.    HPI:  [name] [name] is a 50 y.o. female here as a new referral from Dr. Quintin Alto for migraines.  Patient has had migraines for many years.  Migraines are unilateral, pulsating pounding throbbing, associated light and sound sensitivity, nausea and vomiting, movement makes it worse.  She is tried and failed multiple medications.  She has daily headaches and 15 days a month or migrainous and can last up to 24 hours and most are moderately severe or severe migraines, sitting in a dark room with no sound and no movement helps, stress and whether or triggers.  No aura.  No medication overuse.  She does have neck pain.  No other focal neurologic deficits, associated symptoms, inciting events or modifiable factors.   Medications tried: Maxalt, propranolol, Imitrex, Zofran, Flexeril, gabapentin, naproxen, Phenergan, tramadol, topiramate, nortriptyline and amitriptyline   Reviewed notes, labs and imaging from outside physicians, which showed:   Reviewed MRI of the brain that she brought on disc MRI of the head without contrast media which showed T2 white matter changes likely incidental chronic microvascular changes which could be from migraine.  Per report her right millimeter pineal region cyst is stable when compared to January 2018 I do not have the 2018 MRI to compare against.   Personally reviewed images MRI cervical spine and agree with the following:  C6-C7 disc bulging and facet hypertrophy with moderate right and mild left foraminal stenosis, C5-C6 disc bulging with mild left foraminal stenosis, no intrinsic or compressive spinal cord lesions overall mild normal for age degenerative changes per my review.   REVIEW OF SYSTEMS: Out of a complete 14 system review of symptoms, the patient  complains only of the following symptoms, migraines, joint pain and all other reviewed systems are negative.   ALLERGIES: Allergies  Allergen Reactions   Prednisone     In pill form causes "extreme palpitations" but patient can take injection form     HOME MEDICATIONS: Outpatient Medications Prior to Visit  Medication Sig Dispense Refill   ALPRAZolam (XANAX) 0.5 MG tablet Take 0.5 mg by mouth 3 (three) times daily as needed for anxiety.      amitriptyline (ELAVIL) 25 MG tablet Take 25 mg by mouth at bedtime.     aspirin EC 81 MG tablet Take 81 mg by mouth daily. Swallow whole.     Erenumab-aooe (AIMOVIG) 140 MG/ML SOAJ Inject 140 mg into the skin every 30 (thirty) days. 3 mL 3   ergocalciferol (VITAMIN D2) 50000 units capsule Take 50,000 Units by mouth once a week.     fluticasone (FLONASE) 50 MCG/ACT nasal spray Place 2 sprays into both nostrils daily.     gemfibrozil (LOPID) 600 MG tablet Take 600 mg by mouth 2 (two) times daily.     levothyroxine (SYNTHROID, LEVOTHROID) 100 MCG tablet Take 100 mcg by mouth daily before breakfast.      Omega-3 Fatty Acids (FISH OIL) 1000 MG CAPS Take 2,100 mg by mouth 3 (three) times daily.     ondansetron (ZOFRAN-ODT) 4 MG disintegrating tablet Take 1 tablet (4 mg total) by mouth every 8 (eight) hours as needed for nausea or vomiting. Max 30 per month. 90 tablet 3   propranolol ER (INDERAL LA) 160 MG SR capsule Take 160 mg by mouth daily.     rizatriptan (MAXALT-MLT) 10 MG disintegrating tablet Take 1 tablet (10 mg total) by mouth as needed for up to 1 dose for migraine. May repeat in 2 hours if needed. Max 2 tabs in 24 hours. 27 tablet 3   No facility-administered medications prior to visit.     PAST MEDICAL HISTORY: Past Medical History:  Diagnosis Date   Anxiety    Bulging lumbar disc    Cervical and thoracic   Graves disease    Hyperlipidemia    Macular degeneration    Migraine      PAST SURGICAL HISTORY: Past Surgical History:   Procedure Laterality Date   APPENDECTOMY  2005   KNEE SURGERY Left 06/08/2021   LIPOMA RESECTION  2008   PARTIAL HYSTERECTOMY  2005   SHOULDER SURGERY  2021     FAMILY HISTORY: Family History  Problem Relation Age of Onset   Hypertension Mother    Hypothyroidism Mother    Heart attack Father    Hypertension Father    Diabetes Brother    Breast cancer Sister    Uterine cancer Maternal Grandmother    Esophageal cancer Paternal Uncle    Bladder Cancer Paternal Uncle    Bladder Cancer Paternal Uncle      SOCIAL HISTORY: Social History   Socioeconomic History   Marital status: Married    Spouse name: Legrand Como   Number of children: 1   Years of education: [employer] education level: Some college, no degree  Occupational History   Occupation: other  Comment: Disability  Tobacco Use   Smoking status: Every Day    Packs/day: 1.00    Years: 20.00    Pack years: 20.00    Types: Cigarettes   Smokeless tobacco: Never  Vaping Use   Vaping Use: Never used  Substance and Sexual Activity   Alcohol use: Never   Drug use: Never   Sexual activity: Not on file  Other Topics Concern   Not on file  Social History Narrative   Patient lives at home with family.   Caffeine Use: 1 cans of soda daily   Right handed   Social Determinants of Health   Financial Resource Strain: Not on file  Food Insecurity: Not on file  Transportation Needs: Not on file  Physical Activity: Not on file  Stress: Not on file  Social Connections: Not on file  Intimate Partner Violence: Not on file      PHYSICAL EXAM  Vitals:   07/07/21 0917  BP: 124/85  Pulse: 80  Weight: 148 lb (67.1 kg)  Height: 5\' 4"  (1.626 m)    Body mass index is 25.4 kg/m.   Generalized: Well developed, in no acute distress  Cardiology: normal rate and rhythm, no murmur auscultated  Respiratory: clear to auscultation bilaterally    Neurological examination  Mentation: Alert oriented to time,  place, history taking. Follows all commands speech and language fluent Cranial nerve II-XII: Pupils were equal round reactive to light. Extraocular movements were full, visual field were full on confrontational test. Facial sensation and strength were normal. Head turning and shoulder shrug  were normal and symmetric. Motor: The motor testing reveals 5 over 5 strength of all 4 extremities. Good symmetric motor tone is noted throughout.  Sensory: Sensory testing is intact to soft touch on all 4 extremities. No evidence of extinction is noted.  Coordination: Cerebellar testing reveals good finger-nose-finger and heel-to-shin bilaterally.  Gait and station: Gait is normal.  Reflexes: Deep tendon reflexes are symmetric and normal bilaterally.     DIAGNOSTIC DATA (LABS, IMAGING, TESTING) - I reviewed patient records, labs, notes, testing and imaging myself where available.  Lab Results  Component Value Date   WBC 13.4 (H) 05/14/2017   HGB 15.6 (H) 05/14/2017   HCT 45.0 05/14/2017   MCV 89.8 05/14/2017   PLT 330 05/14/2017      Component Value Date/Time   NA 138 08/30/2007 1627   K 4.0 08/30/2007 1627   CL 105 08/30/2007 1627   CO2 27 08/30/2007 1627   GLUCOSE 101 (H) 08/30/2007 1627   BUN 4 (L) 08/30/2007 1627   CREATININE 0.8 08/30/2007 1627   CALCIUM 9.4 08/30/2007 1627   PROT 6.4 08/30/2007 1627   ALBUMIN 3.7 08/30/2007 1627   AST 27 08/30/2007 1627   ALT 33 08/30/2007 1627   ALKPHOS 95 08/30/2007 1627   BILITOT 0.5 08/30/2007 1627   GFRNONAA 86 08/30/2007 1627   GFRAA 104 08/30/2007 1627   No results found for: CHOL, HDL, LDLCALC, LDLDIRECT, TRIG, CHOLHDL No results found for: HGBA1C No results found for: VITAMINB12 Lab Results  Component Value Date   TSH 1.02 01/01/2014      ASSESSMENT AND PLAN  50 y.o. year old female  has a past medical history of Anxiety, Bulging lumbar disc, Graves disease, Hyperlipidemia, Macular degeneration, and Migraine. here with    Chronic migraine without aura, with intractable migraine, so stated, with status migrainosus  [name] [name] is doing very well on Aimovig monthly and rizatriptan and ondansetron as needed.  Unfortunately, she no longer qualifies for PAP for Amovig. She is questioning need for CGRP as headaches are very well managed. She is on propranolol and amitriptyline managed by PCP. I will have her use remaining 4 injections of Amovig every other month. If headaches remain stable, she may discontinue Amovig. If they worsen, we will plan to start Emgality. She was advised that she can look at [email or web address] to inquire about possible PAP if needed. She will continue rizatriptan as needed. Ondansetron can be used for nausea if needed.  Healthy lifestyle habits encouraged.  She will follow-up with [place] in 1 year, sooner if needed.  She verbalizes understanding and agreement with this plan.    Debbora Presto, MSN, FNP-C 07/07/2021, 9:31 AM  [facility] [address], [place], [place] [address] [phone]

## 2021-07-07 ENCOUNTER — Encounter: Payer: Self-pay | Admitting: Family Medicine

## 2021-07-07 ENCOUNTER — Ambulatory Visit: Payer: Medicare Other | Admitting: Family Medicine

## 2021-07-07 VITALS — BP 124/85 | HR 80 | Ht 64.0 in | Wt 148.0 lb

## 2021-07-07 DIAGNOSIS — G43711 Chronic migraine without aura, intractable, with status migrainosus: Secondary | ICD-10-CM | POA: Diagnosis not present

## 2021-07-07 MED ORDER — RIZATRIPTAN BENZOATE 10 MG PO TBDP: 10.0000 mg | ORAL_TABLET | ORAL | 3 refills | Status: DC | PRN

## 2021-09-22 DIAGNOSIS — J329 Chronic sinusitis, unspecified: Secondary | ICD-10-CM | POA: Diagnosis not present

## 2021-09-22 DIAGNOSIS — J40 Bronchitis, not specified as acute or chronic: Secondary | ICD-10-CM | POA: Diagnosis not present

## 2021-09-22 DIAGNOSIS — F17210 Nicotine dependence, cigarettes, uncomplicated: Secondary | ICD-10-CM | POA: Diagnosis not present

## 2021-09-22 DIAGNOSIS — R059 Cough, unspecified: Secondary | ICD-10-CM | POA: Diagnosis not present

## 2021-09-29 DIAGNOSIS — J441 Chronic obstructive pulmonary disease with (acute) exacerbation: Secondary | ICD-10-CM | POA: Diagnosis not present

## 2021-10-10 DIAGNOSIS — L03039 Cellulitis of unspecified toe: Secondary | ICD-10-CM | POA: Diagnosis not present

## 2021-10-10 DIAGNOSIS — D485 Neoplasm of uncertain behavior of skin: Secondary | ICD-10-CM | POA: Diagnosis not present

## 2021-10-13 ENCOUNTER — Encounter (INDEPENDENT_AMBULATORY_CARE_PROVIDER_SITE_OTHER): Payer: Self-pay | Admitting: Gastroenterology

## 2021-10-13 ENCOUNTER — Other Ambulatory Visit: Payer: Self-pay

## 2021-10-13 ENCOUNTER — Ambulatory Visit (INDEPENDENT_AMBULATORY_CARE_PROVIDER_SITE_OTHER): Payer: Medicare Other | Admitting: Gastroenterology

## 2021-10-13 DIAGNOSIS — R195 Other fecal abnormalities: Secondary | ICD-10-CM | POA: Diagnosis not present

## 2021-10-13 NOTE — Patient Instructions (Signed)
Schedule colonoscopy

## 2021-10-13 NOTE — H&P (View-Only) (Signed)
[name] [name], M.D. ?Gastroenterology & Hepatology ?[facility] ?[address] ?[place], [place] [address] ?Primary Care Physician: ?Sasser, Silvestre Moment, MD ?[address] ?[place] [address] ? ?Referring MD: PCP ? ?Chief Complaint: Positive Cologuard ? ?History of Present Illness: ?[name] [name] is a 51 y.o. female with PMH Graves disease, anxiety, HLD, macular degeneration, who presents for evaluation of positve Cologuard. ? ?Patient had a positive Cologuard on 06/07/2021.  Most recent labs from 05/30/2021 showed a hemoglobin of 14.9. ? ?Patient denies having any complaints. The patient denies having any nausea, vomiting, fever, chills, hematochezia, melena, hematemesis, abdominal distention, abdominal pain, diarrhea, jaundice, pruritus or weight loss (has gained some lb recently). ? ?Patient had a history of chest pain for which she underwent multiple cardiac testing. Never had heartburn or dysphagia, but was put on Pepcid - does not know if it really helped. ? ?Last HAL:PFXTK ?Last Colonoscopy: 09/19/2007, normal colonoscopy.  Random colonic biopsies were negative for microscopic colitis. ? ?FHx: father had Barrett's esophagus, neg for any gastrointestinal/liver disease, sister breast cancer ?Social: smokes 1/2 pack cigs per day, neg alcohol or illicit drug use ?Surgical: no abdominal surgeries ? ?Past Medical History: ?Past Medical History:  ?Diagnosis Date  ? Anxiety   ? Bulging lumbar disc   ? Cervical and thoracic  ? Graves disease   ? Hyperlipidemia   ? Macular degeneration   ? Migraine   ? ? ?Past Surgical History: ?Past Surgical History:  ?Procedure Laterality Date  ? APPENDECTOMY  2005  ? KNEE SURGERY Left 06/08/2021  ? LIPOMA RESECTION  2008  ? PARTIAL HYSTERECTOMY  2005  ? SHOULDER SURGERY  2021  ? ? ?Family History: ?Family History  ?Problem Relation Age of Onset  ? Hypertension Mother   ? Hypothyroidism Mother   ? Heart attack Father   ? Hypertension  Father   ? Diabetes Brother   ? Breast cancer Sister   ? Uterine cancer Maternal Grandmother   ? Esophageal cancer Paternal Uncle   ? Bladder Cancer Paternal Uncle   ? Bladder Cancer Paternal Uncle   ? ? ?Social History: ?Social History  ? ?Tobacco Use  ?Smoking Status Every Day  ? Packs/day: 0.50  ? Years: 20.00  ? Pack years: 10.00  ? Types: Cigarettes  ?Smokeless Tobacco Never  ? ?Social History  ? ?Substance and Sexual Activity  ?Alcohol Use Never  ? ?Social History  ? ?Substance and Sexual Activity  ?Drug Use Never  ? ? ?Allergies: ?Allergies  ?Allergen Reactions  ? Prednisone   ?  In pill form causes "extreme palpitations" but patient can take injection form  ? ? ?Medications: ?Current Outpatient Medications  ?Medication Sig Dispense Refill  ? ALPRAZolam (XANAX) 0.5 MG tablet Take 0.5 mg by mouth 3 (three) times daily as needed for anxiety.     ? amitriptyline (ELAVIL) 25 MG tablet Take 25 mg by mouth at bedtime.    ? cephALEXin (KEFLEX) 500 MG capsule Take 500 mg by mouth 3 (three) times daily. Will finish on 10/16/2021.    ? diclofenac (VOLTAREN) 75 MG EC tablet Take 75 mg by mouth 2 (two) times daily.    ? Erenumab-aooe (AIMOVIG) 140 MG/ML SOAJ Inject 140 mg into the skin every 30 (thirty) days. 3 mL 3  ? ergocalciferol (VITAMIN D2) 50000 units capsule Take 50,000 Units by mouth once a week.    ? famotidine (PEPCID) 20 MG tablet Take 20 mg by mouth 2 (two) times  daily.    ? fluticasone (FLONASE) 50 MCG/ACT nasal spray Place 2 sprays into both nostrils daily.    ? gemfibrozil (LOPID) 600 MG tablet Take 600 mg by mouth 2 (two) times daily.    ? levothyroxine (SYNTHROID, LEVOTHROID) 100 MCG tablet Take 100 mcg by mouth daily before breakfast.     ? ondansetron (ZOFRAN-ODT) 4 MG disintegrating tablet Take 1 tablet (4 mg total) by mouth every 8 (eight) hours as needed for nausea or vomiting. Max 30 per month. 90 tablet 3  ? propranolol ER (INDERAL LA) 160 MG SR capsule Take 160 mg by mouth daily.    ?  rizatriptan (MAXALT-MLT) 10 MG disintegrating tablet Take 1 tablet (10 mg total) by mouth as needed for up to 1 dose for migraine. May repeat in 2 hours if needed. Max 2 tabs in 24 hours. 27 tablet 3  ? ?No current facility-administered medications for this visit.  ? ? ?Review of Systems: ?GENERAL: negative for malaise, night sweats ?HEENT: No changes in hearing or vision, no nose bleeds or other nasal problems. ?NECK: Negative for lumps, goiter, pain and significant neck swelling ?RESPIRATORY: Negative for cough, wheezing ?CARDIOVASCULAR: Negative for chest pain, leg swelling, palpitations, orthopnea ?GI: SEE HPI ?MUSCULOSKELETAL: Negative for joint pain or swelling, back pain, and muscle pain. ?SKIN: Negative for lesions, rash ?PSYCH: Negative for sleep disturbance, mood disorder and recent psychosocial stressors. ?HEMATOLOGY Negative for prolonged bleeding, bruising easily, and swollen nodes. ?ENDOCRINE: Negative for cold or heat intolerance, polyuria, polydipsia and goiter. ?NEURO: negative for tremor, gait imbalance, syncope and seizures. ?The remainder of the review of systems is noncontributory. ? ? ?Physical Exam: ?BP 133/83 (BP Location: Left Arm, Patient Position: Sitting, Cuff Size: Large)   Pulse 82   Temp 98 ?F (36.7 ?C) (Oral)   Ht '5\' 4"'$  (1.626 m)   Wt 151 lb 3.2 oz (68.6 kg)   BMI 25.95 kg/m?  ?GENERAL: The patient is AO x3, in no acute distress. ?HEENT: Head is normocephalic and atraumatic. EOMI are intact. Mouth is well hydrated and without lesions. ?NECK: Supple. No masses ?LUNGS: Clear to auscultation. No presence of rhonchi/wheezing/rales. Adequate chest expansion ?HEART: RRR, normal s1 and s2. ?ABDOMEN: Soft, nontender, no guarding, no peritoneal signs, and nondistended. BS +. No masses. ?EXTREMITIES: Without any cyanosis, clubbing, rash, lesions or edema. ?NEUROLOGIC: AOx3, no focal motor deficit. ?SKIN: no jaundice, no rashes ? ? ?Imaging/Labs: ?as above ? ?I personally reviewed and  interpreted the available labs, imaging and endoscopic files. ? ?Impression and Plan: ?[name] [name] is a 51 y.o. female with PMH Graves disease, anxiety, HLD, macular degeneration, who presents for evaluation of positve Cologuard. Patient does not have any high risk factors for colorectal cancer malignancy.  She has been asymptomatic. Discussed cologuard test results in detail, specifically what it means when the test is positive or negative.  Discussed that there is a possibility that even when the test is positive there may not be a polyp found on colonoscopy. More than 50% of the office visit was dedicated to discussing the procedure, including the day of and risks involved. Patient understands what the procedure involves including the benefits and any risks. Patient understands alternatives to the proposed procedure. Risks including (but not limited to) bleeding, tearing of the lining (perforation), rupture of adjacent organs, problems with heart and lung function, infection, and medication reactions. A small percentage of complications may require surgery, hospitalization, repeat endoscopic procedure, and/or transfusion. A small percentage of polyps and other tumors  may not be seen. ? ?- Schedule colonoscopy ? ?All questions were answered.     ? ?[name] Peppers, MD ?Gastroenterology and Hepatology ?[facility] ? ?

## 2021-10-13 NOTE — Progress Notes (Signed)
[name], M.D. ?Gastroenterology & Hepatology ?[facility] ?[address] ?[place], [place] [address] ?Primary Care Physician: ?Sasser, Silvestre Moment, MD ?[address] ?[place] [address] ? ?Referring MD: PCP ? ?Chief Complaint: Positive Cologuard ? ?History of Present Illness: ?[name] [name] is a 51 y.o. female with PMH Graves disease, anxiety, HLD, macular degeneration, who presents for evaluation of positve Cologuard. ? ?Patient had a positive Cologuard on 06/07/2021.  Most recent labs from 05/30/2021 showed a hemoglobin of 14.9. ? ?Patient denies having any complaints. The patient denies having any nausea, vomiting, fever, chills, hematochezia, melena, hematemesis, abdominal distention, abdominal pain, diarrhea, jaundice, pruritus or weight loss (has gained some lb recently). ? ?Patient had a history of chest pain for which she underwent multiple cardiac testing. Never had heartburn or dysphagia, but was put on Pepcid - does not know if it really helped. ? ?Last UJW:JXBJY ?Last Colonoscopy: 09/19/2007, normal colonoscopy.  Random colonic biopsies were negative for microscopic colitis. ? ?FHx: father had Barrett's esophagus, neg for any gastrointestinal/liver disease, sister breast cancer ?Social: smokes 1/2 pack cigs per day, neg alcohol or illicit drug use ?Surgical: no abdominal surgeries ? ?Past Medical History: ?Past Medical History:  ?Diagnosis Date  ? Anxiety   ? Bulging lumbar disc   ? Cervical and thoracic  ? Graves disease   ? Hyperlipidemia   ? Macular degeneration   ? Migraine   ? ? ?Past Surgical History: ?Past Surgical History:  ?Procedure Laterality Date  ? APPENDECTOMY  2005  ? KNEE SURGERY Left 06/08/2021  ? LIPOMA RESECTION  2008  ? PARTIAL HYSTERECTOMY  2005  ? SHOULDER SURGERY  2021  ? ? ?Family History: ?Family History  ?Problem Relation Age of Onset  ? Hypertension Mother   ? Hypothyroidism Mother   ? Heart attack Father   ? Hypertension  Father   ? Diabetes Brother   ? Breast cancer Sister   ? Uterine cancer Maternal Grandmother   ? Esophageal cancer Paternal Uncle   ? Bladder Cancer Paternal Uncle   ? Bladder Cancer Paternal Uncle   ? ? ?Social History: ?Social History  ? ?Tobacco Use  ?Smoking Status Every Day  ? Packs/day: 0.50  ? Years: 20.00  ? Pack years: 10.00  ? Types: Cigarettes  ?Smokeless Tobacco Never  ? ?Social History  ? ?Substance and Sexual Activity  ?Alcohol Use Never  ? ?Social History  ? ?Substance and Sexual Activity  ?Drug Use Never  ? ? ?Allergies: ?Allergies  ?Allergen Reactions  ? Prednisone   ?  In pill form causes "extreme palpitations" but patient can take injection form  ? ? ?Medications: ?Current Outpatient Medications  ?Medication Sig Dispense Refill  ? ALPRAZolam (XANAX) 0.5 MG tablet Take 0.5 mg by mouth 3 (three) times daily as needed for anxiety.     ? amitriptyline (ELAVIL) 25 MG tablet Take 25 mg by mouth at bedtime.    ? cephALEXin (KEFLEX) 500 MG capsule Take 500 mg by mouth 3 (three) times daily. Will finish on 10/16/2021.    ? diclofenac (VOLTAREN) 75 MG EC tablet Take 75 mg by mouth 2 (two) times daily.    ? Erenumab-aooe (AIMOVIG) 140 MG/ML SOAJ Inject 140 mg into the skin every 30 (thirty) days. 3 mL 3  ? ergocalciferol (VITAMIN D2) 50000 units capsule Take 50,000 Units by mouth once a week.    ? famotidine (PEPCID) 20 MG tablet Take 20 mg by mouth 2 (two) times  daily.    ? fluticasone (FLONASE) 50 MCG/ACT nasal spray Place 2 sprays into both nostrils daily.    ? gemfibrozil (LOPID) 600 MG tablet Take 600 mg by mouth 2 (two) times daily.    ? levothyroxine (SYNTHROID, LEVOTHROID) 100 MCG tablet Take 100 mcg by mouth daily before breakfast.     ? ondansetron (ZOFRAN-ODT) 4 MG disintegrating tablet Take 1 tablet (4 mg total) by mouth every 8 (eight) hours as needed for nausea or vomiting. Max 30 per month. 90 tablet 3  ? propranolol ER (INDERAL LA) 160 MG SR capsule Take 160 mg by mouth daily.    ?  rizatriptan (MAXALT-MLT) 10 MG disintegrating tablet Take 1 tablet (10 mg total) by mouth as needed for up to 1 dose for migraine. May repeat in 2 hours if needed. Max 2 tabs in 24 hours. 27 tablet 3  ? ?No current facility-administered medications for this visit.  ? ? ?Review of Systems: ?GENERAL: negative for malaise, night sweats ?HEENT: No changes in hearing or vision, no nose bleeds or other nasal problems. ?NECK: Negative for lumps, goiter, pain and significant neck swelling ?RESPIRATORY: Negative for cough, wheezing ?CARDIOVASCULAR: Negative for chest pain, leg swelling, palpitations, orthopnea ?GI: SEE HPI ?MUSCULOSKELETAL: Negative for joint pain or swelling, back pain, and muscle pain. ?SKIN: Negative for lesions, rash ?PSYCH: Negative for sleep disturbance, mood disorder and recent psychosocial stressors. ?HEMATOLOGY Negative for prolonged bleeding, bruising easily, and swollen nodes. ?ENDOCRINE: Negative for cold or heat intolerance, polyuria, polydipsia and goiter. ?NEURO: negative for tremor, gait imbalance, syncope and seizures. ?The remainder of the review of systems is noncontributory. ? ? ?Physical Exam: ?BP 133/83 (BP Location: Left Arm, Patient Position: Sitting, Cuff Size: Large)   Pulse 82   Temp 98 ?F (36.7 ?C) (Oral)   Ht '5\' 4"'$  (1.626 m)   Wt 151 lb 3.2 oz (68.6 kg)   BMI 25.95 kg/m?  ?GENERAL: The patient is AO x3, in no acute distress. ?HEENT: Head is normocephalic and atraumatic. EOMI are intact. Mouth is well hydrated and without lesions. ?NECK: Supple. No masses ?LUNGS: Clear to auscultation. No presence of rhonchi/wheezing/rales. Adequate chest expansion ?HEART: RRR, normal s1 and s2. ?ABDOMEN: Soft, nontender, no guarding, no peritoneal signs, and nondistended. BS +. No masses. ?EXTREMITIES: Without any cyanosis, clubbing, rash, lesions or edema. ?NEUROLOGIC: AOx3, no focal motor deficit. ?SKIN: no jaundice, no rashes ? ? ?Imaging/Labs: ?as above ? ?I personally reviewed and  interpreted the available labs, imaging and endoscopic files. ? ?Impression and Plan: ?[name] [name] is a 51 y.o. female with PMH Graves disease, anxiety, HLD, macular degeneration, who presents for evaluation of positve Cologuard. Patient does not have any high risk factors for colorectal cancer malignancy.  She has been asymptomatic. Discussed cologuard test results in detail, specifically what it means when the test is positive or negative.  Discussed that there is a possibility that even when the test is positive there may not be a polyp found on colonoscopy. More than 50% of the office visit was dedicated to discussing the procedure, including the day of and risks involved. Patient understands what the procedure involves including the benefits and any risks. Patient understands alternatives to the proposed procedure. Risks including (but not limited to) bleeding, tearing of the lining (perforation), rupture of adjacent organs, problems with heart and lung function, infection, and medication reactions. A small percentage of complications may require surgery, hospitalization, repeat endoscopic procedure, and/or transfusion. A small percentage of polyps and other tumors  may not be seen. ? ?- Schedule colonoscopy ? ?All questions were answered.     ? ?[name], MD ?Gastroenterology and Hepatology ?[facility] ? ?

## 2021-10-18 ENCOUNTER — Telehealth (INDEPENDENT_AMBULATORY_CARE_PROVIDER_SITE_OTHER): Payer: Self-pay

## 2021-10-18 ENCOUNTER — Other Ambulatory Visit (INDEPENDENT_AMBULATORY_CARE_PROVIDER_SITE_OTHER): Payer: Self-pay

## 2021-10-18 MED ORDER — NA SULFATE-K SULFATE-MG SULF 17.5-3.13-1.6 GM/177ML PO SOLN: 1.0000 | Freq: Once | ORAL | 0 refills | Status: AC

## 2021-10-18 NOTE — Telephone Encounter (Signed)
[name] [name], CMA  ?

## 2021-10-19 ENCOUNTER — Encounter (INDEPENDENT_AMBULATORY_CARE_PROVIDER_SITE_OTHER): Payer: Self-pay

## 2021-11-02 DIAGNOSIS — M1712 Unilateral primary osteoarthritis, left knee: Secondary | ICD-10-CM | POA: Diagnosis not present

## 2021-11-03 DIAGNOSIS — I10 Essential (primary) hypertension: Secondary | ICD-10-CM | POA: Diagnosis not present

## 2021-11-03 DIAGNOSIS — K429 Umbilical hernia without obstruction or gangrene: Secondary | ICD-10-CM | POA: Diagnosis not present

## 2021-11-04 LAB — COLOGUARD: Cologuard: POSITIVE — AB

## 2021-11-06 ENCOUNTER — Encounter (INDEPENDENT_AMBULATORY_CARE_PROVIDER_SITE_OTHER): Payer: Self-pay | Admitting: Gastroenterology

## 2021-11-09 ENCOUNTER — Encounter (HOSPITAL_COMMUNITY)
Admission: RE | Disposition: A | Discharge status: Home / Self Care | Payer: Self-pay | Source: Home / Self Care | Attending: Gastroenterology

## 2021-11-09 ENCOUNTER — Ambulatory Visit (HOSPITAL_COMMUNITY): Payer: Medicare Other | Admitting: Anesthesiology

## 2021-11-09 ENCOUNTER — Other Ambulatory Visit: Payer: Self-pay

## 2021-11-09 ENCOUNTER — Ambulatory Visit (HOSPITAL_COMMUNITY)
Admission: RE | Admit: 2021-11-09 | Discharge: 2021-11-09 | Disposition: A | Discharge status: Home / Self Care | Payer: Medicare Other | Attending: Gastroenterology | Admitting: Gastroenterology

## 2021-11-09 ENCOUNTER — Encounter (HOSPITAL_COMMUNITY): Payer: Self-pay | Admitting: Gastroenterology

## 2021-11-09 ENCOUNTER — Other Ambulatory Visit (INDEPENDENT_AMBULATORY_CARE_PROVIDER_SITE_OTHER): Payer: Self-pay

## 2021-11-09 ENCOUNTER — Telehealth (INDEPENDENT_AMBULATORY_CARE_PROVIDER_SITE_OTHER): Payer: Self-pay

## 2021-11-09 ENCOUNTER — Ambulatory Visit (HOSPITAL_BASED_OUTPATIENT_CLINIC_OR_DEPARTMENT_OTHER): Payer: Medicare Other | Admitting: Anesthesiology

## 2021-11-09 DIAGNOSIS — Z8379 Family history of other diseases of the digestive system: Secondary | ICD-10-CM | POA: Diagnosis not present

## 2021-11-09 DIAGNOSIS — R519 Headache, unspecified: Secondary | ICD-10-CM | POA: Diagnosis not present

## 2021-11-09 DIAGNOSIS — D123 Benign neoplasm of transverse colon: Secondary | ICD-10-CM | POA: Diagnosis not present

## 2021-11-09 DIAGNOSIS — Z79899 Other long term (current) drug therapy: Secondary | ICD-10-CM | POA: Insufficient documentation

## 2021-11-09 DIAGNOSIS — F419 Anxiety disorder, unspecified: Secondary | ICD-10-CM | POA: Diagnosis not present

## 2021-11-09 DIAGNOSIS — K635 Polyp of colon: Secondary | ICD-10-CM

## 2021-11-09 DIAGNOSIS — F32A Depression, unspecified: Secondary | ICD-10-CM | POA: Diagnosis not present

## 2021-11-09 DIAGNOSIS — E0500 Thyrotoxicosis with diffuse goiter without thyrotoxic crisis or storm: Secondary | ICD-10-CM | POA: Diagnosis not present

## 2021-11-09 DIAGNOSIS — K644 Residual hemorrhoidal skin tags: Secondary | ICD-10-CM | POA: Diagnosis not present

## 2021-11-09 DIAGNOSIS — E785 Hyperlipidemia, unspecified: Secondary | ICD-10-CM | POA: Insufficient documentation

## 2021-11-09 DIAGNOSIS — R195 Other fecal abnormalities: Secondary | ICD-10-CM

## 2021-11-09 DIAGNOSIS — F17210 Nicotine dependence, cigarettes, uncomplicated: Secondary | ICD-10-CM | POA: Insufficient documentation

## 2021-11-09 DIAGNOSIS — Z1211 Encounter for screening for malignant neoplasm of colon: Secondary | ICD-10-CM | POA: Diagnosis not present

## 2021-11-09 DIAGNOSIS — H3530 Unspecified macular degeneration: Secondary | ICD-10-CM | POA: Diagnosis not present

## 2021-11-09 DIAGNOSIS — R1900 Intra-abdominal and pelvic swelling, mass and lump, unspecified site: Secondary | ICD-10-CM

## 2021-11-09 DIAGNOSIS — R000 Tachycardia, unspecified: Secondary | ICD-10-CM | POA: Insufficient documentation

## 2021-11-09 DIAGNOSIS — K648 Other hemorrhoids: Secondary | ICD-10-CM | POA: Insufficient documentation

## 2021-11-09 HISTORY — DX: Nausea with vomiting, unspecified: R11.2

## 2021-11-09 HISTORY — PX: POLYPECTOMY: SHX5525

## 2021-11-09 HISTORY — DX: Other specified postprocedural states: Z98.890

## 2021-11-09 HISTORY — PX: COLONOSCOPY WITH PROPOFOL: SHX5780

## 2021-11-09 HISTORY — DX: Thyrotoxicosis, unspecified without thyrotoxic crisis or storm: E05.90

## 2021-11-09 LAB — HM COLONOSCOPY

## 2021-11-09 SURGERY — COLONOSCOPY WITH PROPOFOL
Anesthesia: General

## 2021-11-09 MED ORDER — SIMETHICONE 40 MG/0.6ML PO SUSP
ORAL | Status: DC | PRN
  Administered 2021-11-09: 50 mL

## 2021-11-09 MED ORDER — PROPOFOL 500 MG/50ML IV EMUL
INTRAVENOUS | Status: DC | PRN
  Administered 2021-11-09: 175 ug/kg/min via INTRAVENOUS

## 2021-11-09 MED ORDER — LACTATED RINGERS IV SOLN: INTRAVENOUS | Status: DC

## 2021-11-09 MED ORDER — PROPOFOL 10 MG/ML IV BOLUS
INTRAVENOUS | Status: DC | PRN
  Administered 2021-11-09: 100 mg via INTRAVENOUS

## 2021-11-09 NOTE — Telephone Encounter (Signed)
[place] [facility] will be Monday 11/21/21 at 7:30 am and she is aware ?

## 2021-11-09 NOTE — Op Note (Signed)
[facility] ?Patient Name: [name] [name] ?Procedure Date: 11/09/2021 12:18 PM ?MRN: [id number] ?Date of Birth: 11-04-1970 ?Attending MD: [name] ,  ?CSN: [id number] ?Age: 51 ?Admit Type: Outpatient ?Procedure:                Colonoscopy ?Indications:              Positive Cologuard test ?Providers:                [name], Charlsie Quest. Theda Sers RN, RN, Kenney Houseman  ?                          [place] ?Referring MD:              ?Medicines:                Monitored Anesthesia Care ?Complications:            No immediate complications. ?Estimated Blood Loss:     Estimated blood loss: none. ?Procedure:                Pre-Anesthesia Assessment: ?                          - Prior to the procedure, a History and Physical  ?                          was performed, and patient medications, allergies  ?                          and sensitivities were reviewed. The patient's  ?                          tolerance of previous anesthesia was reviewed. ?                          - The risks and benefits of the procedure and the  ?                          sedation options and risks were discussed with the  ?                          patient. All questions were answered and informed  ?                          consent was obtained. ?                          - ASA Grade Assessment: II - A patient with mild  ?                          systemic disease. ?                          After obtaining informed consent, the colonoscope  ?                          was passed under direct vision. Throughout the  ?  procedure, the patient's blood pressure, pulse, and  ?                          oxygen saturations were monitored continuously. The  ?                          PCF-HQ190L ([id number]) scope was introduced through  ?                          the anus and advanced to the the cecum, identified  ?                          by appendiceal orifice and ileocecal valve. The  ?                           colonoscopy was performed without difficulty. The  ?                          patient tolerated the procedure well. The quality  ?                          of the bowel preparation was good. ?Scope In: 12:51:01 PM ?Scope Out: 1:11:05 PM ?Scope Withdrawal Time: 0 hours 14 minutes 56 seconds  ?Total Procedure Duration: 0 hours 20 minutes 4 seconds  ?Findings: ?     The perianal and digital rectal examinations were normal. ?     The terminal ileum appeared normal. ?     A 3 mm polyp was found in the transverse colon. The polyp was sessile.  ?     The polyp was removed with a cold biopsy forceps. Resection and  ?     retrieval were complete. ?     Non-bleeding internal hemorrhoids were found during retroflexion. The  ?     hemorrhoids were small. ?Impression:               - The examined portion of the ileum was normal. ?                          - One 3 mm polyp in the transverse colon, removed  ?                          with a cold biopsy forceps. Resected and retrieved. ?                          - Non-bleeding internal hemorrhoids. ?Moderate Sedation: ?     Per Anesthesia Care ?Recommendation:           - Discharge patient to home (ambulatory). ?                          - Resume previous diet. ?                          - Await pathology results. ?                          -  Repeat colonoscopy for surveillance based on  ?                          pathology results. ?Procedure Code(s):        --- Professional --- ?                          [phone], Colonoscopy, flexible; with biopsy, single  ?                          or multiple ?Diagnosis Code(s):        --- Professional --- ?                          K63.5, Polyp of colon ?                          K64.8, Other hemorrhoids ?                          R19.5, Other fecal abnormalities ?CPT copyright 2019 American Medical Association. All rights reserved. ?The codes documented in this report are preliminary and upon coder review may  ?be revised to meet current  compliance requirements. ?[name], MD ?[name],  ?11/09/2021 1:19:21 PM ?This report has been signed electronically. ?Number of Addenda: 0 ?

## 2021-11-09 NOTE — Transfer of Care (Signed)
Immediate Anesthesia Transfer of Care Note ? ?Patient: [name] [name] ? ?Procedure(s) Performed: COLONOSCOPY WITH PROPOFOL ?POLYPECTOMY ? ?Patient Location: PACU ? ?Anesthesia Type:General ? ?Level of Consciousness: awake, alert  and oriented ? ?Airway & Oxygen Therapy: Patient Spontanous Breathing ? ?Post-op Assessment: Report given to RN, Post -op Vital signs reviewed and stable, Patient moving all extremities X 4 and Patient able to stick tongue midline ? ?Post vital signs: Reviewed ? ?Last Vitals:  ?Vitals Value Taken Time  ?BP 155/81   ?Temp 98.3   ?Pulse 56   ?Resp 16   ?SpO2 98   ? ? ?Last Pain:  ?Vitals:  ? 11/09/21 1247  ?TempSrc:   ?PainSc: 0-No pain  ?   ? ?Patients Stated Pain Goal: 6 (11/09/21 1154) ? ?Complications: No notable events documented. ?

## 2021-11-09 NOTE — Interval H&P Note (Signed)
History and Physical Interval Note: ? ?11/09/2021 ?12:41 PM ? ?[name] [name]  has presented today for surgery, with the diagnosis of Screening Colonoscopy Positive Cologuard.  The various methods of treatment have been discussed with the patient and family. After consideration of risks, benefits and other options for treatment, the patient has consented to  Procedure(s) with comments: ?COLONOSCOPY WITH PROPOFOL (N/A) - 1:05 as a surgical intervention.  The patient's history has been reviewed, patient examined, no change in status, stable for surgery.  I have reviewed the patient's chart and labs.  Questions were answered to the patient's satisfaction.   ? ? ?[name] [name] ? ? ?

## 2021-11-09 NOTE — Anesthesia Preprocedure Evaluation (Addendum)
Anesthesia Evaluation  ?Patient identified by MRN, date of birth, ID band ?Patient awake ? ? ? ?Reviewed: ?Allergy & Precautions, NPO status , Patient's Chart, lab work & pertinent test results, reviewed documented beta blocker date and time  ? ?History of Anesthesia Complications ?(+) PONV and history of anesthetic complications ? ?Airway ?Mallampati: II ? ?TM Distance: >3 FB ?Neck ROM: Full ? ? ? Dental ? ?(+) Dental Advisory Given, Teeth Intact ?  ?Pulmonary ?Current Smoker and Patient abstained from smoking.,  ?  ?Pulmonary exam normal ?breath sounds clear to auscultation ? ? ? ? ? ? Cardiovascular ?(-) hypertensionPt. on home beta blockers ?Normal cardiovascular exam+ dysrhythmias (tachycardia, on propranolol)  ?Rhythm:Regular Rate:Normal ? ? ?  ?Neuro/Psych ? Headaches, PSYCHIATRIC DISORDERS Anxiety Depression   ? GI/Hepatic ?Neg liver ROS, GERD  Controlled,  ?Endo/Other  ?Hypothyroidism Hyperthyroidism  ? Renal/GU ?negative Renal ROS  ?negative genitourinary ?  ?Musculoskeletal ?negative musculoskeletal ROS ?(+)  ? Abdominal ?  ?Peds ?negative pediatric ROS ?(+)  Hematology ?negative hematology ROS ?(+)   ?Anesthesia Other Findings ? ? Reproductive/Obstetrics ?negative OB ROS ? ?  ? ? ? ? ? ? ? ? ? ? ? ? ? ?  ?  ? ? ? ? ? ?Anesthesia Physical ?Anesthesia Plan ? ?ASA: 2 ? ?Anesthesia Plan: General  ? ?Post-op Pain Management: Minimal or no pain anticipated  ? ?Induction: Intravenous ? ?PONV Risk Score and Plan: TIVA ? ?Airway Management Planned: Nasal Cannula and Natural Airway ? ?Additional Equipment:  ? ?Intra-op Plan:  ? ?Post-operative Plan:  ? ?Informed Consent: I have reviewed the patients History and Physical, chart, labs and discussed the procedure including the risks, benefits and alternatives for the proposed anesthesia with the patient or authorized representative who has indicated his/her understanding and acceptance.  ? ? ? ?Dental advisory given ? ?Plan Discussed  with: CRNA and Surgeon ? ?Anesthesia Plan Comments:   ? ? ? ? ? ? ?Anesthesia Quick Evaluation ? ?

## 2021-11-09 NOTE — Discharge Instructions (Signed)
You are being discharged to home.  Resume your previous diet.  We are waiting for your pathology results.  Your physician has recommended a repeat colonoscopy for surveillance based on pathology results.  

## 2021-11-09 NOTE — Anesthesia Postprocedure Evaluation (Signed)
Anesthesia Post Note ? ?Patient: JANDY BRACKENS ? ?Procedure(s) Performed: COLONOSCOPY WITH PROPOFOL ?POLYPECTOMY ? ?Patient location during evaluation: PACU ?Anesthesia Type: General ?Level of consciousness: awake and alert and oriented ?Pain management: pain level controlled ?Vital Signs Assessment: post-procedure vital signs reviewed and stable ?Respiratory status: spontaneous breathing, nonlabored ventilation and respiratory function stable ?Cardiovascular status: blood pressure returned to baseline and stable ?Postop Assessment: no apparent nausea or vomiting ?Anesthetic complications: no ? ? ?No notable events documented. ? ? ?Last Vitals:  ?Vitals:  ? 11/09/21 1154 11/09/21 1314  ?BP: (!) 149/95 (!) 155/81  ?Pulse: 77 63  ?Resp: (!) 21 20  ?Temp: 36.9 ?C 36.8 ?C  ?SpO2: 98% 98%  ?  ?Last Pain:  ?Vitals:  ? 11/09/21 1314  ?TempSrc: Oral  ?PainSc: 0-No pain  ? ? ?  ?  ?  ?  ?  ?  ? ?Yuleidy Rappleye C Timm Bonenberger ? ? ? ? ?

## 2021-11-10 LAB — SURGICAL PATHOLOGY

## 2021-11-14 ENCOUNTER — Encounter (INDEPENDENT_AMBULATORY_CARE_PROVIDER_SITE_OTHER): Payer: Self-pay | Admitting: *Deleted

## 2021-11-15 ENCOUNTER — Encounter (HOSPITAL_COMMUNITY): Payer: Self-pay | Admitting: Gastroenterology

## 2021-11-17 DIAGNOSIS — K224 Dyskinesia of esophagus: Secondary | ICD-10-CM | POA: Diagnosis not present

## 2021-11-17 DIAGNOSIS — E559 Vitamin D deficiency, unspecified: Secondary | ICD-10-CM | POA: Diagnosis not present

## 2021-11-17 DIAGNOSIS — E7800 Pure hypercholesterolemia, unspecified: Secondary | ICD-10-CM | POA: Diagnosis not present

## 2021-11-17 DIAGNOSIS — E039 Hypothyroidism, unspecified: Secondary | ICD-10-CM | POA: Diagnosis not present

## 2021-11-17 DIAGNOSIS — E7849 Other hyperlipidemia: Secondary | ICD-10-CM | POA: Diagnosis not present

## 2021-11-17 DIAGNOSIS — E782 Mixed hyperlipidemia: Secondary | ICD-10-CM | POA: Diagnosis not present

## 2021-11-21 ENCOUNTER — Ambulatory Visit (HOSPITAL_COMMUNITY)
Admission: RE | Admit: 2021-11-21 | Discharge: 2021-11-21 | Disposition: A | Discharge status: Home / Self Care | Payer: Medicare Other | Source: Ambulatory Visit | Attending: Gastroenterology | Admitting: Gastroenterology

## 2021-11-21 DIAGNOSIS — K7689 Other specified diseases of liver: Secondary | ICD-10-CM | POA: Diagnosis not present

## 2021-11-21 DIAGNOSIS — R1900 Intra-abdominal and pelvic swelling, mass and lump, unspecified site: Secondary | ICD-10-CM

## 2021-11-21 DIAGNOSIS — K439 Ventral hernia without obstruction or gangrene: Secondary | ICD-10-CM | POA: Diagnosis not present

## 2021-11-24 DIAGNOSIS — R079 Chest pain, unspecified: Secondary | ICD-10-CM | POA: Diagnosis not present

## 2021-11-24 DIAGNOSIS — E876 Hypokalemia: Secondary | ICD-10-CM | POA: Diagnosis not present

## 2021-11-24 DIAGNOSIS — M75101 Unspecified rotator cuff tear or rupture of right shoulder, not specified as traumatic: Secondary | ICD-10-CM | POA: Diagnosis not present

## 2021-11-24 DIAGNOSIS — M791 Myalgia, unspecified site: Secondary | ICD-10-CM | POA: Diagnosis not present

## 2021-11-24 DIAGNOSIS — R002 Palpitations: Secondary | ICD-10-CM | POA: Diagnosis not present

## 2021-11-24 DIAGNOSIS — F17210 Nicotine dependence, cigarettes, uncomplicated: Secondary | ICD-10-CM | POA: Diagnosis not present

## 2021-11-24 DIAGNOSIS — T466X5A Adverse effect of antihyperlipidemic and antiarteriosclerotic drugs, initial encounter: Secondary | ICD-10-CM | POA: Diagnosis not present

## 2021-11-24 DIAGNOSIS — M542 Cervicalgia: Secondary | ICD-10-CM | POA: Diagnosis not present

## 2021-11-28 DIAGNOSIS — M1712 Unilateral primary osteoarthritis, left knee: Secondary | ICD-10-CM | POA: Diagnosis not present

## 2021-11-29 DIAGNOSIS — Z1231 Encounter for screening mammogram for malignant neoplasm of breast: Secondary | ICD-10-CM | POA: Diagnosis not present

## 2021-12-09 DIAGNOSIS — M75102 Unspecified rotator cuff tear or rupture of left shoulder, not specified as traumatic: Secondary | ICD-10-CM | POA: Diagnosis not present

## 2021-12-09 DIAGNOSIS — M25512 Pain in left shoulder: Secondary | ICD-10-CM | POA: Diagnosis not present

## 2021-12-19 DIAGNOSIS — H542X11 Low vision right eye category 1, low vision left eye category 1: Secondary | ICD-10-CM | POA: Diagnosis not present

## 2021-12-19 DIAGNOSIS — H3553 Other dystrophies primarily involving the sensory retina: Secondary | ICD-10-CM | POA: Diagnosis not present

## 2021-12-19 DIAGNOSIS — H53413 Scotoma involving central area, bilateral: Secondary | ICD-10-CM | POA: Diagnosis not present

## 2021-12-23 DIAGNOSIS — M75102 Unspecified rotator cuff tear or rupture of left shoulder, not specified as traumatic: Secondary | ICD-10-CM | POA: Diagnosis not present

## 2021-12-23 DIAGNOSIS — M1712 Unilateral primary osteoarthritis, left knee: Secondary | ICD-10-CM | POA: Diagnosis not present

## 2022-01-04 DIAGNOSIS — E039 Hypothyroidism, unspecified: Secondary | ICD-10-CM | POA: Diagnosis not present

## 2022-01-17 DIAGNOSIS — M1712 Unilateral primary osteoarthritis, left knee: Secondary | ICD-10-CM | POA: Diagnosis not present

## 2022-01-26 DIAGNOSIS — M1712 Unilateral primary osteoarthritis, left knee: Secondary | ICD-10-CM | POA: Diagnosis not present

## 2022-01-30 ENCOUNTER — Encounter: Payer: Self-pay | Admitting: Family Medicine

## 2022-01-31 ENCOUNTER — Encounter: Payer: Self-pay | Admitting: Family Medicine

## 2022-02-01 ENCOUNTER — Other Ambulatory Visit: Payer: Self-pay | Admitting: Neurology

## 2022-02-01 DIAGNOSIS — G43711 Chronic migraine without aura, intractable, with status migrainosus: Secondary | ICD-10-CM

## 2022-02-01 MED ORDER — AIMOVIG 140 MG/ML ~~LOC~~ SOAJ: 140.0000 mg | SUBCUTANEOUS | 1 refills | Status: DC

## 2022-02-14 DIAGNOSIS — L03031 Cellulitis of right toe: Secondary | ICD-10-CM | POA: Diagnosis not present

## 2022-02-14 DIAGNOSIS — M79671 Pain in right foot: Secondary | ICD-10-CM | POA: Diagnosis not present

## 2022-02-14 DIAGNOSIS — M79674 Pain in right toe(s): Secondary | ICD-10-CM | POA: Diagnosis not present

## 2022-02-14 DIAGNOSIS — L600 Ingrowing nail: Secondary | ICD-10-CM | POA: Diagnosis not present

## 2022-02-14 DIAGNOSIS — E039 Hypothyroidism, unspecified: Secondary | ICD-10-CM | POA: Diagnosis not present

## 2022-02-21 DIAGNOSIS — M1712 Unilateral primary osteoarthritis, left knee: Secondary | ICD-10-CM | POA: Diagnosis not present

## 2022-02-27 DIAGNOSIS — L821 Other seborrheic keratosis: Secondary | ICD-10-CM | POA: Diagnosis not present

## 2022-02-27 DIAGNOSIS — L738 Other specified follicular disorders: Secondary | ICD-10-CM | POA: Diagnosis not present

## 2022-02-27 DIAGNOSIS — D225 Melanocytic nevi of trunk: Secondary | ICD-10-CM | POA: Diagnosis not present

## 2022-02-27 DIAGNOSIS — D2371 Other benign neoplasm of skin of right lower limb, including hip: Secondary | ICD-10-CM | POA: Diagnosis not present

## 2022-02-27 DIAGNOSIS — L723 Sebaceous cyst: Secondary | ICD-10-CM | POA: Diagnosis not present

## 2022-02-28 DIAGNOSIS — L03031 Cellulitis of right toe: Secondary | ICD-10-CM | POA: Diagnosis not present

## 2022-02-28 DIAGNOSIS — M79674 Pain in right toe(s): Secondary | ICD-10-CM | POA: Diagnosis not present

## 2022-02-28 DIAGNOSIS — L600 Ingrowing nail: Secondary | ICD-10-CM | POA: Diagnosis not present

## 2022-02-28 DIAGNOSIS — M79671 Pain in right foot: Secondary | ICD-10-CM | POA: Diagnosis not present

## 2022-03-14 DIAGNOSIS — M79674 Pain in right toe(s): Secondary | ICD-10-CM | POA: Diagnosis not present

## 2022-03-14 DIAGNOSIS — L03031 Cellulitis of right toe: Secondary | ICD-10-CM | POA: Diagnosis not present

## 2022-03-14 DIAGNOSIS — M79671 Pain in right foot: Secondary | ICD-10-CM | POA: Diagnosis not present

## 2022-03-17 DIAGNOSIS — E039 Hypothyroidism, unspecified: Secondary | ICD-10-CM | POA: Diagnosis not present

## 2022-03-17 DIAGNOSIS — E7849 Other hyperlipidemia: Secondary | ICD-10-CM | POA: Diagnosis not present

## 2022-03-17 DIAGNOSIS — E559 Vitamin D deficiency, unspecified: Secondary | ICD-10-CM | POA: Diagnosis not present

## 2022-03-17 DIAGNOSIS — E876 Hypokalemia: Secondary | ICD-10-CM | POA: Diagnosis not present

## 2022-03-22 DIAGNOSIS — G43909 Migraine, unspecified, not intractable, without status migrainosus: Secondary | ICD-10-CM | POA: Diagnosis not present

## 2022-03-22 DIAGNOSIS — N3090 Cystitis, unspecified without hematuria: Secondary | ICD-10-CM | POA: Diagnosis not present

## 2022-03-22 DIAGNOSIS — R030 Elevated blood-pressure reading, without diagnosis of hypertension: Secondary | ICD-10-CM | POA: Diagnosis not present

## 2022-03-22 DIAGNOSIS — E876 Hypokalemia: Secondary | ICD-10-CM | POA: Diagnosis not present

## 2022-03-22 DIAGNOSIS — R079 Chest pain, unspecified: Secondary | ICD-10-CM | POA: Diagnosis not present

## 2022-03-22 DIAGNOSIS — M542 Cervicalgia: Secondary | ICD-10-CM | POA: Diagnosis not present

## 2022-03-22 DIAGNOSIS — E559 Vitamin D deficiency, unspecified: Secondary | ICD-10-CM | POA: Diagnosis not present

## 2022-03-22 DIAGNOSIS — M75101 Unspecified rotator cuff tear or rupture of right shoulder, not specified as traumatic: Secondary | ICD-10-CM | POA: Diagnosis not present

## 2022-03-22 DIAGNOSIS — E7849 Other hyperlipidemia: Secondary | ICD-10-CM | POA: Diagnosis not present

## 2022-03-22 DIAGNOSIS — M791 Myalgia, unspecified site: Secondary | ICD-10-CM | POA: Diagnosis not present

## 2022-03-22 DIAGNOSIS — E782 Mixed hyperlipidemia: Secondary | ICD-10-CM | POA: Diagnosis not present

## 2022-03-22 DIAGNOSIS — M545 Low back pain, unspecified: Secondary | ICD-10-CM | POA: Diagnosis not present

## 2022-03-22 DIAGNOSIS — E039 Hypothyroidism, unspecified: Secondary | ICD-10-CM | POA: Diagnosis not present

## 2022-03-22 DIAGNOSIS — R002 Palpitations: Secondary | ICD-10-CM | POA: Diagnosis not present

## 2022-03-22 DIAGNOSIS — T466X5A Adverse effect of antihyperlipidemic and antiarteriosclerotic drugs, initial encounter: Secondary | ICD-10-CM | POA: Diagnosis not present

## 2022-03-23 DIAGNOSIS — M25562 Pain in left knee: Secondary | ICD-10-CM | POA: Diagnosis not present

## 2022-03-23 DIAGNOSIS — M1712 Unilateral primary osteoarthritis, left knee: Secondary | ICD-10-CM | POA: Diagnosis not present

## 2022-04-07 DIAGNOSIS — M75102 Unspecified rotator cuff tear or rupture of left shoulder, not specified as traumatic: Secondary | ICD-10-CM | POA: Diagnosis not present

## 2022-04-18 DIAGNOSIS — S20161A Insect bite (nonvenomous) of breast, right breast, initial encounter: Secondary | ICD-10-CM | POA: Diagnosis not present

## 2022-04-18 DIAGNOSIS — Z6824 Body mass index (BMI) 24.0-24.9, adult: Secondary | ICD-10-CM | POA: Diagnosis not present

## 2022-04-20 DIAGNOSIS — M1712 Unilateral primary osteoarthritis, left knee: Secondary | ICD-10-CM | POA: Diagnosis not present

## 2022-05-15 ENCOUNTER — Encounter: Payer: Self-pay | Admitting: Family Medicine

## 2022-05-15 DIAGNOSIS — G43711 Chronic migraine without aura, intractable, with status migrainosus: Secondary | ICD-10-CM

## 2022-05-17 ENCOUNTER — Encounter: Payer: Self-pay | Admitting: *Deleted

## 2022-05-17 ENCOUNTER — Ambulatory Visit: Payer: Medicare Other

## 2022-05-17 MED ORDER — AIMOVIG 140 MG/ML ~~LOC~~ SOAJ: 1.0000 mL | SUBCUTANEOUS | 1 refills | Status: DC

## 2022-05-17 MED ORDER — AIMOVIG 140 MG/ML ~~LOC~~ SOAJ: 1.0000 mL | SUBCUTANEOUS | 2 refills | Status: DC

## 2022-06-05 DIAGNOSIS — L03012 Cellulitis of left finger: Secondary | ICD-10-CM | POA: Diagnosis not present

## 2022-06-05 DIAGNOSIS — L089 Local infection of the skin and subcutaneous tissue, unspecified: Secondary | ICD-10-CM | POA: Diagnosis not present

## 2022-06-08 DIAGNOSIS — L089 Local infection of the skin and subcutaneous tissue, unspecified: Secondary | ICD-10-CM | POA: Diagnosis not present

## 2022-06-08 DIAGNOSIS — Z6824 Body mass index (BMI) 24.0-24.9, adult: Secondary | ICD-10-CM | POA: Diagnosis not present

## 2022-06-14 DIAGNOSIS — Z6823 Body mass index (BMI) 23.0-23.9, adult: Secondary | ICD-10-CM | POA: Diagnosis not present

## 2022-06-14 DIAGNOSIS — M1710 Unilateral primary osteoarthritis, unspecified knee: Secondary | ICD-10-CM | POA: Diagnosis not present

## 2022-07-06 DIAGNOSIS — E039 Hypothyroidism, unspecified: Secondary | ICD-10-CM | POA: Diagnosis not present

## 2022-07-06 DIAGNOSIS — R030 Elevated blood-pressure reading, without diagnosis of hypertension: Secondary | ICD-10-CM | POA: Diagnosis not present

## 2022-07-06 DIAGNOSIS — F17210 Nicotine dependence, cigarettes, uncomplicated: Secondary | ICD-10-CM | POA: Diagnosis not present

## 2022-07-06 DIAGNOSIS — M179 Osteoarthritis of knee, unspecified: Secondary | ICD-10-CM | POA: Diagnosis not present

## 2022-07-06 DIAGNOSIS — E7849 Other hyperlipidemia: Secondary | ICD-10-CM | POA: Diagnosis not present

## 2022-07-06 DIAGNOSIS — Z6823 Body mass index (BMI) 23.0-23.9, adult: Secondary | ICD-10-CM | POA: Diagnosis not present

## 2022-07-10 ENCOUNTER — Ambulatory Visit: Payer: Medicare Other | Admitting: Family Medicine

## 2022-07-12 DIAGNOSIS — R946 Abnormal results of thyroid function studies: Secondary | ICD-10-CM | POA: Diagnosis not present

## 2022-07-12 DIAGNOSIS — R5383 Other fatigue: Secondary | ICD-10-CM | POA: Diagnosis not present

## 2022-07-12 DIAGNOSIS — E876 Hypokalemia: Secondary | ICD-10-CM | POA: Diagnosis not present

## 2022-07-12 DIAGNOSIS — E7849 Other hyperlipidemia: Secondary | ICD-10-CM | POA: Diagnosis not present

## 2022-07-12 DIAGNOSIS — E559 Vitamin D deficiency, unspecified: Secondary | ICD-10-CM | POA: Diagnosis not present

## 2022-07-12 DIAGNOSIS — K224 Dyskinesia of esophagus: Secondary | ICD-10-CM | POA: Diagnosis not present

## 2022-07-12 DIAGNOSIS — E039 Hypothyroidism, unspecified: Secondary | ICD-10-CM | POA: Diagnosis not present

## 2022-07-12 DIAGNOSIS — E781 Pure hyperglyceridemia: Secondary | ICD-10-CM | POA: Diagnosis not present

## 2022-07-13 DIAGNOSIS — E781 Pure hyperglyceridemia: Secondary | ICD-10-CM | POA: Diagnosis not present

## 2022-07-13 DIAGNOSIS — E782 Mixed hyperlipidemia: Secondary | ICD-10-CM | POA: Diagnosis not present

## 2022-07-13 DIAGNOSIS — E559 Vitamin D deficiency, unspecified: Secondary | ICD-10-CM | POA: Diagnosis not present

## 2022-07-13 DIAGNOSIS — E039 Hypothyroidism, unspecified: Secondary | ICD-10-CM | POA: Diagnosis not present

## 2022-07-13 DIAGNOSIS — R5383 Other fatigue: Secondary | ICD-10-CM | POA: Diagnosis not present

## 2022-07-22 DIAGNOSIS — N1831 Chronic kidney disease, stage 3a: Secondary | ICD-10-CM | POA: Diagnosis not present

## 2022-07-22 DIAGNOSIS — G43909 Migraine, unspecified, not intractable, without status migrainosus: Secondary | ICD-10-CM | POA: Diagnosis not present

## 2022-07-22 DIAGNOSIS — R030 Elevated blood-pressure reading, without diagnosis of hypertension: Secondary | ICD-10-CM | POA: Diagnosis not present

## 2022-07-22 DIAGNOSIS — R079 Chest pain, unspecified: Secondary | ICD-10-CM | POA: Diagnosis not present

## 2022-07-22 DIAGNOSIS — T466X5A Adverse effect of antihyperlipidemic and antiarteriosclerotic drugs, initial encounter: Secondary | ICD-10-CM | POA: Diagnosis not present

## 2022-07-22 DIAGNOSIS — E876 Hypokalemia: Secondary | ICD-10-CM | POA: Diagnosis not present

## 2022-07-22 DIAGNOSIS — M25569 Pain in unspecified knee: Secondary | ICD-10-CM | POA: Diagnosis not present

## 2022-07-22 DIAGNOSIS — E7849 Other hyperlipidemia: Secondary | ICD-10-CM | POA: Diagnosis not present

## 2022-07-22 DIAGNOSIS — E559 Vitamin D deficiency, unspecified: Secondary | ICD-10-CM | POA: Diagnosis not present

## 2022-07-22 DIAGNOSIS — M791 Myalgia, unspecified site: Secondary | ICD-10-CM | POA: Diagnosis not present

## 2022-07-22 DIAGNOSIS — M75101 Unspecified rotator cuff tear or rupture of right shoulder, not specified as traumatic: Secondary | ICD-10-CM | POA: Diagnosis not present

## 2022-07-22 DIAGNOSIS — E039 Hypothyroidism, unspecified: Secondary | ICD-10-CM | POA: Diagnosis not present

## 2022-07-25 DIAGNOSIS — M75102 Unspecified rotator cuff tear or rupture of left shoulder, not specified as traumatic: Secondary | ICD-10-CM | POA: Diagnosis not present

## 2022-08-08 DIAGNOSIS — M1712 Unilateral primary osteoarthritis, left knee: Secondary | ICD-10-CM | POA: Diagnosis not present

## 2022-08-08 DIAGNOSIS — M25562 Pain in left knee: Secondary | ICD-10-CM | POA: Diagnosis not present

## 2022-08-08 DIAGNOSIS — M25662 Stiffness of left knee, not elsewhere classified: Secondary | ICD-10-CM | POA: Diagnosis not present

## 2022-08-08 NOTE — H&P (Signed)
TOTAL KNEE ADMISSION H&P  Patient is being admitted for left total knee arthroplasty.  Subjective:  Chief Complaint: Left knee pain.  HPI: MC HOLLEN, 52 y.o. female has a history of pain and functional disability in the left knee due to arthritis and has failed non-surgical conservative treatments for greater than 12 weeks to include NSAID's and/or analgesics, corticosteriod injections, flexibility and strengthening excercises, and activity modification. Onset of symptoms was gradual, starting several years ago with gradually worsening course since that time. The patient noted no past surgery on the left knee.  Patient currently rates pain in the left knee at 8 out of 10 with activity. Patient has night pain, worsening of pain with activity and weight bearing, and pain that interferes with activities of daily living. Patient has evidence of  bone-on-bone arthritis in the medial compartment with slight varus. Near bone-on-bone arthritis in the patellofemoral compartment  by imaging studies. There is no active infection.  Patient Active Problem List   Diagnosis Date Noted   Positive colorectal cancer screening using Cologuard test 10/13/2021   Chronic migraine without aura without status migrainosus, not intractable 03/07/2018   Graves disease 01/01/2014   HYPOTHYROIDISM, POST-RADIATION 08/17/2008   ANXIETY 05/15/2008   DEPRESSION 05/15/2008   RECTAL BLEEDING 05/08/2008    Past Medical History:  Diagnosis Date   Anxiety    Bulging lumbar disc    Cervical and thoracic   Graves disease    Hyperlipidemia    Hyperthyroidism    Macular degeneration    Migraine    PONV (postoperative nausea and vomiting)     Past Surgical History:  Procedure Laterality Date   APPENDECTOMY  2005   COLONOSCOPY WITH PROPOFOL N/A 11/09/2021   Procedure: COLONOSCOPY WITH PROPOFOL;  Surgeon: Harvel Quale, MD;  [facility];  Service: Gastroenterology;  Laterality: N/A;  1:05    KNEE SURGERY Left 06/08/2021   LIPOMA RESECTION  2008   PARTIAL HYSTERECTOMY  2005   POLYPECTOMY  11/09/2021   Procedure: POLYPECTOMY;  Surgeon: Harvel Quale, MD;  Location: [facility];  Service: Gastroenterology;;   SHOULDER SURGERY  2021    Prior to Admission medications   Medication Sig Start Date End Date Taking? Authorizing Provider  ALPRAZolam Duanne Moron) 0.5 MG tablet Take 0.5 mg by mouth 3 (three) times daily as needed for anxiety.     [provider]  amitriptyline (ELAVIL) 25 MG tablet Take 25 mg by mouth at bedtime. 05/19/21   [provider]  diclofenac (VOLTAREN) 75 MG EC tablet Take 75 mg by mouth 2 (two) times daily.    [provider]  Erenumab-aooe (AIMOVIG) 140 MG/ML SOAJ Inject 140 mg into the skin every 30 (thirty) days. 02/01/22   Lomax, Amy, NP  Erenumab-aooe (AIMOVIG) 140 MG/ML SOAJ Inject 140 mg into the skin every 30 (thirty) days. 05/17/22   Lomax, Amy, NP  ergocalciferol (VITAMIN D2) 50000 units capsule Take 50,000 Units by mouth every Friday.    [provider]  famotidine (PEPCID) 20 MG tablet Take 20 mg by mouth 2 (two) times daily.    [provider]  fluticasone (FLONASE) 50 MCG/ACT nasal spray Place 2 sprays into both nostrils daily.    [provider]  gemfibrozil (LOPID) 600 MG tablet Take 600 mg by mouth 2 (two) times daily. 04/07/21   [provider]  levothyroxine (SYNTHROID, LEVOTHROID) 100 MCG tablet Take 100 mcg by mouth daily before breakfast.     [provider]  ondansetron (ZOFRAN-ODT) 4 MG disintegrating tablet Take 1 tablet (4 mg total) by mouth every 8 (eight) hours as needed for nausea or vomiting. Max 30 per month. 07/07/20   Lomax, Amy, NP  propranolol ER (INDERAL LA) 160 MG SR capsule Take 160 mg by mouth at bedtime.    [provider]  rizatriptan (MAXALT-MLT) 10 MG disintegrating tablet Take 1 tablet (10 mg total) by mouth as needed for up to 1 dose for  migraine. May repeat in 2 hours if needed. Max 2 tabs in 24 hours. 07/07/21   Lomax, Amy, NP    Allergies  Allergen Reactions   Prednisone     In pill form causes "extreme palpitations" but patient can take injection form    Social History   Socioeconomic History   Marital status: Married    Spouse name: Legrand Como   Number of children: 1   Years of education: [employer] education level: Some college, no degree  Occupational History   Occupation: other    Comment: Disability  Tobacco Use   Smoking status: Every Day    Packs/day: 0.50    Years: 20.00    Total pack years: 10.00    Types: Cigarettes   Smokeless tobacco: Never  Vaping Use   Vaping Use: Never used  Substance and Sexual Activity   Alcohol use: Never   Drug use: Never   Sexual activity: Not on file  Other Topics Concern   Not on file  Social History Narrative   Patient lives at home with family.   Caffeine Use: 1 cans of soda daily   Right handed   Social Determinants of Health   Financial Resource Strain: Not on file  Food Insecurity: Not on file  Transportation Needs: Not on file  Physical Activity: Not on file  Stress: Not on file  Social Connections: Not on file  Intimate Partner Violence: Not on file    Tobacco Use: High Risk (11/15/2021)   Patient History    Smoking Tobacco Use: Every Day    Smokeless Tobacco Use: Never    Passive Exposure: Not on file   Social History   Substance and Sexual Activity  Alcohol Use Never    Family History  Problem Relation Age of Onset   Hypertension Mother    Hypothyroidism Mother    Heart attack Father    Hypertension Father    Diabetes Brother    Breast cancer Sister    Uterine cancer Maternal Grandmother    Esophageal cancer Paternal Uncle    Bladder Cancer Paternal Uncle    Bladder Cancer Paternal Uncle     Review of Systems  Constitutional:  Negative for chills and fever.  HENT: Negative.    Eyes: Negative.   Respiratory:  Negative  for cough and shortness of breath.   Cardiovascular:  Negative for chest pain and palpitations.  Gastrointestinal:  Negative for abdominal pain, constipation, diarrhea, nausea and vomiting.  Genitourinary:  Negative for dysuria, frequency and urgency.  Musculoskeletal:  Positive for joint pain.  Skin:  Negative for rash.    Objective:  Physical Exam: Well nourished and well developed.  General: Alert and oriented x3, cooperative and pleasant, no acute distress.  Head: normocephalic, atraumatic, neck supple.  Eyes: EOMI. Abdomen: non-tender to palpation and soft, normoactive bowel sounds. Musculoskeletal: Right Knee Exam: No effusion present. No swelling present. The range of motion is: 0 to 125 degrees. No crepitus on range of motion of the knee. No medial  joint line tenderness. No lateral joint line tenderness. The knee is stable.  Left Knee Exam: Moderate effusion present. No swelling present. The Range of motion is: 0 to 125 degrees. Positive crepitus on range of motion of the knee. Significant medial joint line tenderness. No lateral joint line tenderness. The knee is stable.  Left Hip Exam: The range of motion: normal without discomfort.  Calves soft and nontender. Motor function intact in LE. Strength 5/5 LE bilaterally. Neuro: Distal pulses 2+. Sensation to light touch intact in LE.  Vital signs in last 24 hours: BP: ()/()  Arterial Line BP: ()/()   Imaging Review Plain radiographs demonstrate moderate degenerative joint disease of the left knee. The overall alignment is mild varus. The bone quality appears to be adequate for age and reported activity level.  Assessment/Plan:  End stage arthritis, left knee   The patient history, physical examination, clinical judgment of the provider and imaging studies are consistent with end stage degenerative joint disease of the left knee and total knee arthroplasty is deemed medically necessary. The treatment options  including medical management, injection therapy arthroscopy and arthroplasty were discussed at length. The risks and benefits of total knee arthroplasty were presented and reviewed. The risks due to aseptic loosening, infection, stiffness, patella tracking problems, thromboembolic complications and other imponderables were discussed. The patient acknowledged the explanation, agreed to proceed with the plan and consent was signed. Patient is being admitted for inpatient treatment for surgery, pain control, PT, OT, prophylactic antibiotics, VTE prophylaxis, progressive ambulation and ADLs and discharge planning. The patient is planning to be discharged  home .  Patient's anticipated LOS is less than 2 midnights, meeting these requirements: - Younger than 24 - Lives within 1 hour of care - Has a competent adult at home to recover with post-op - NO history of  - Diabetes  - Coronary Artery Disease  - Heart failure  - Heart attack  - Stroke  - DVT/VTE  - Cardiac arrhythmia  - Respiratory Failure/COPD  - Renal failure  - Anemia  - Advanced Liver disease  Therapy Plans: EO ([place]) Disposition: Home with Husband Planned DVT Prophylaxis: Aspirin 325 mg BID DME Needed: RW PCP: Consuello Masse, MD (clearance received) TXA: IV Allergies: prednisone (oral form - palpitations) Anesthesia Concerns: requesting general anesthesia due to lower back issues BMI: 23.1 Last HgbA1c: not diabetic  Pharmacy: [facility]  Other: -hydrocodone '5mg'$  up to 4 times a day - discussed oxycodone, tramadol, methocarbamol, gabapentin  - Patient was instructed on what medications to stop prior to surgery. - Follow-up visit in 2 weeks with Dr. Wynelle Link - Begin physical therapy following surgery - Pre-operative lab work as pre-surgical testing - Prescriptions will be provided in hospital at time of discharge  R. Jaynie Bream, PA-C Orthopedic Surgery [facility] [place]

## 2022-08-09 ENCOUNTER — Other Ambulatory Visit: Payer: Self-pay | Admitting: *Deleted

## 2022-08-09 ENCOUNTER — Encounter: Payer: Self-pay | Admitting: Family Medicine

## 2022-08-09 DIAGNOSIS — G43711 Chronic migraine without aura, intractable, with status migrainosus: Secondary | ICD-10-CM

## 2022-08-09 MED ORDER — AIMOVIG 140 MG/ML ~~LOC~~ SOAJ: 140.0000 mg | SUBCUTANEOUS | 0 refills | Status: DC

## 2022-08-21 NOTE — Progress Notes (Signed)
Pt prefers no spinal block.  COVID Vaccine Completed: yes  Date of COVID positive in last 90 days: no  PCP - Consuello Masse, MD Cardiologist - Dr. Domenic Polite (saw for CP, found out it was reflux)  Medical clearance by Consuello Masse 07/06/22 on chart  Chest x-ray - n/a EKG - 08/22/22 Epic/chart Stress Test - 09/27/20 Epic ECHO - n/a Cardiac Cath - n/a Pacemaker/ICD device last checked: n/a Spinal Cord Stimulator: n/a  Bowel Prep - no  Sleep Study - n/a CPAP -   Fasting Blood Sugar - n/a Checks Blood Sugar _____ times a day  Last dose of GLP1 agonist-  N/A GLP1 instructions:  N/A   Last dose of SGLT-2 inhibitors-  N/A SGLT-2 instructions: N/A   Blood Thinner Instructions: n/a Aspirin Instructions: Last Dose:  Activity level: Can go up a flight of stairs and perform activities of daily living without stopping and without symptoms of chest pain or shortness of breath.  Anesthesia review:   Patient denies shortness of breath, fever, cough and chest pain at PAT appointment  Patient verbalized understanding of instructions that were given to them at the PAT appointment. Patient was also instructed that they will need to review over the PAT instructions again at home before surgery.

## 2022-08-21 NOTE — Patient Instructions (Signed)
SURGICAL WAITING ROOM VISITATION  Patients having surgery or a procedure may have no more than 2 support people in the waiting area - these visitors may rotate.    Children under the age of 17 must have an adult with them who is not the patient.  Due to an increase in RSV and influenza rates and associated hospitalizations, children ages 64 and under may not visit patients in [place].  If the patient needs to stay at the hospital during part of their recovery, the visitor guidelines for inpatient rooms apply. Pre-op nurse will coordinate an appropriate time for 1 support person to accompany patient in pre-op.  This support person may not rotate.    Please refer to the [facility] website for the visitor guidelines for Inpatients (after your surgery is over and you are in a regular room).    Your procedure is scheduled on: 09/04/22   Report to [facility] Main Entrance    Report to admitting at 9:20 AM   Call this number if you have problems the morning of surgery [phone]   Do not eat food :After Midnight.   After Midnight you may have the following liquids until 8:50 AM DAY OF SURGERY  Water Non-Citrus Juices (without pulp, NO RED-Apple, White grape, White cranberry) Black Coffee (NO MILK/CREAM OR CREAMERS, sugar ok)  Clear Tea (NO MILK/CREAM OR CREAMERS, sugar ok) regular and decaf                             Plain Jell-O (NO RED)                                           Fruit ices (not with fruit pulp, NO RED)                                     Popsicles (NO RED)                                                               Sports drinks like Gatorade (NO RED)    The day of surgery:  Drink ONE (1) Pre-Surgery Clear Ensure at 8:50 AM the morning of surgery. Drink in one sitting. Do not sip.  This drink was given to you during your hospital  pre-op appointment visit. Nothing else to drink after completing the  Pre-Surgery Clear Ensure.          If  you have questions, please contact your surgeon's office.   FOLLOW BOWEL PREP AND ANY ADDITIONAL PRE OP INSTRUCTIONS YOU RECEIVED FROM YOUR SURGEON'S OFFICE!!!     Oral Hygiene is also important to reduce your risk of infection.                                    Remember - BRUSH YOUR TEETH THE MORNING OF SURGERY WITH YOUR REGULAR TOOTHPASTE  DENTURES WILL BE REMOVED PRIOR TO SURGERY PLEASE DO NOT APPLY "Poly grip" OR ADHESIVES!!!  Do NOT smoke after Midnight   Take these medicines the morning of surgery with A SIP OF WATER: Tylenol, Xanax, Lopid, Norco, Levothyroxine, Zofran, Propranolol   DO NOT TAKE ANY ORAL DIABETIC MEDICATIONS DAY OF YOUR SURGERY  Bring CPAP mask and tubing day of surgery.                              You may not have any metal on your body including hair pins, jewelry, and body piercing             Do not wear make-up, lotions, powders, perfumes, or deodorant  Do not wear nail polish including gel and S&S, artificial/acrylic nails, or any other type of covering on natural nails including finger and toenails. If you have artificial nails, gel coating, etc. that needs to be removed by a nail salon please have this removed prior to surgery or surgery may need to be canceled/ delayed if the surgeon/ anesthesia feels like they are unable to be safely monitored.   Do not shave  48 hours prior to surgery.    Do not bring valuables to the hospital. [place].   Contacts, glasses, dentures or bridgework may not be worn into surgery.   Bring small overnight bag day of surgery.   DO NOT [place]. PHARMACY WILL DISPENSE MEDICATIONS LISTED ON YOUR MEDICATION LIST TO YOU DURING YOUR ADMISSION [place]!   Special Instructions: Bring a copy of your healthcare power of attorney and living will documents the day of surgery if you haven't scanned them before.              Please read  over the following fact sheets you were given: IF [place] [phone] Schneiders    If you received a COVID test during your pre-op visit  it is requested that you wear a mask when out in public, stay away from anyone that may not be feeling well and notify your surgeon if you develop symptoms. If you test positive for Covid or have been in contact with anyone that has tested positive in the last 10 days please notify you surgeon.    [facility] - Preparing for Surgery Before surgery, you can play an important role.  Because skin is not sterile, your skin needs to be as free of germs as possible.  You can reduce the number of germs on your skin by washing with CHG (chlorahexidine gluconate) soap before surgery.  CHG is an antiseptic cleaner which kills germs and bonds with the skin to continue killing germs even after washing. Please DO NOT use if you have an allergy to CHG or antibacterial soaps.  If your skin becomes reddened/irritated stop using the CHG and inform your nurse when you arrive at Short Stay. Do not shave (including legs and underarms) for at least 48 hours prior to the first CHG shower.  You may shave your face/neck.  Please follow these instructions carefully:  1.  Shower with CHG Soap the night before surgery and the  morning of surgery.  2.  If you choose to wash your hair, wash your hair first as usual with your normal  shampoo.  3.  After you shampoo, rinse your hair and body thoroughly to remove  the shampoo.                             4.  Use CHG as you would any other liquid soap.  You can apply chg directly to the skin and wash.  Gently with a scrungie or clean washcloth.  5.  Apply the CHG Soap to your body ONLY FROM THE NECK DOWN.   Do   not use on face/ open                           Wound or open sores. Avoid contact with eyes, ears mouth and   genitals (private parts).                       Wash face,  Genitals  (private parts) with your normal soap.             6.  Wash thoroughly, paying special attention to the area where your    surgery  will be performed.  7.  Thoroughly rinse your body with warm water from the neck down.  8.  DO NOT shower/wash with your normal soap after using and rinsing off the CHG Soap.                9.  Pat yourself dry with a clean towel.            10.  Wear clean pajamas.            11.  Place clean sheets on your bed the night of your first shower and do not  sleep with pets. Day of Surgery : Do not apply any lotions/deodorants the morning of surgery.  Please wear clean clothes to the hospital/surgery center.  FAILURE TO FOLLOW THESE INSTRUCTIONS MAY RESULT IN THE CANCELLATION OF YOUR SURGERY  PATIENT SIGNATURE_________________________________  NURSE SIGNATURE__________________________________  ________________________________________________________________________  Adam Phenix  An incentive spirometer is a tool that can help keep your lungs clear and active. This tool measures how well you are filling your lungs with each breath. Taking long deep breaths may help reverse or decrease the chance of developing breathing (pulmonary) problems (especially infection) following: A long period of time when you are unable to move or be active. BEFORE THE PROCEDURE  If the spirometer includes an indicator to show your best effort, your nurse or respiratory therapist will set it to a desired goal. If possible, sit up straight or lean slightly forward. Try not to slouch. Hold the incentive spirometer in an upright position. INSTRUCTIONS FOR USE  Sit on the edge of your bed if possible, or sit up as far as you can in bed or on a chair. Hold the incentive spirometer in an upright position. Breathe out normally. Place the mouthpiece in your mouth and seal your lips tightly around it. Breathe in slowly and as deeply as possible, raising the piston or the ball toward  the top of the column. Hold your breath for 3-5 seconds or for as long as possible. Allow the piston or ball to fall to the bottom of the column. Remove the mouthpiece from your mouth and breathe out normally. Rest for a few seconds and repeat Steps 1 through 7 at least 10 times every 1-2 hours when you are awake. Take your time and take a few normal breaths between deep breaths. The spirometer may include an indicator  to show your best effort. Use the indicator as a goal to work toward during each repetition. After each set of 10 deep breaths, practice coughing to be sure your lungs are clear. If you have an incision (the cut made at the time of surgery), support your incision when coughing by placing a pillow or rolled up towels firmly against it. Once you are able to get out of bed, walk around indoors and cough well. You may stop using the incentive spirometer when instructed by your caregiver.  RISKS AND COMPLICATIONS Take your time so you do not get dizzy or light-headed. If you are in pain, you may need to take or ask for pain medication before doing incentive spirometry. It is harder to take a deep breath if you are having pain. AFTER USE Rest and breathe slowly and easily. It can be helpful to keep track of a log of your progress. Your caregiver can provide you with a simple table to help with this. If you are using the spirometer at home, follow these instructions: [place] IF:  You are having difficultly using the spirometer. You have trouble using the spirometer as often as instructed. Your pain medication is not giving enough relief while using the spirometer. You develop fever of 100.5 F (38.1 C) or higher. SEEK IMMEDIATE MEDICAL CARE IF:  You cough up bloody sputum that had not been present before. You develop fever of 102 F (38.9 C) or greater. You develop worsening pain at or near the incision site. MAKE SURE YOU:  Understand these instructions. Will watch your  condition. Will get help right away if you are not doing well or get worse. Document Released: 12/04/2006 Document Revised: 10/16/2011 Document Reviewed: 02/04/2007 [facility] Patient Information 2014 [place], [place].   ________________________________________________________________________

## 2022-08-22 ENCOUNTER — Encounter (HOSPITAL_COMMUNITY): Payer: Self-pay

## 2022-08-22 ENCOUNTER — Encounter (HOSPITAL_COMMUNITY)
Admission: RE | Admit: 2022-08-22 | Discharge: 2022-08-22 | Disposition: A | Discharge status: Home / Self Care | Payer: Medicare Other | Source: Ambulatory Visit | Attending: Orthopedic Surgery | Admitting: Orthopedic Surgery

## 2022-08-22 VITALS — BP 128/92 | HR 80 | Temp 97.8°F | Resp 12 | Ht 64.0 in | Wt 138.0 lb

## 2022-08-22 DIAGNOSIS — I2510 Atherosclerotic heart disease of native coronary artery without angina pectoris: Secondary | ICD-10-CM | POA: Diagnosis not present

## 2022-08-22 DIAGNOSIS — Z01818 Encounter for other preprocedural examination: Secondary | ICD-10-CM

## 2022-08-22 DIAGNOSIS — J984 Other disorders of lung: Secondary | ICD-10-CM

## 2022-08-22 HISTORY — DX: Fibromyalgia: M79.7

## 2022-08-22 HISTORY — DX: Family history of other specified conditions: Z84.89

## 2022-08-22 HISTORY — DX: Gastro-esophageal reflux disease without esophagitis: K21.9

## 2022-08-22 LAB — BASIC METABOLIC PANEL
Anion gap: 8 (ref 5–15)
BUN: 7 mg/dL (ref 6–20)
CO2: 25 mmol/L (ref 22–32)
Calcium: 9.6 mg/dL (ref 8.9–10.3)
Chloride: 103 mmol/L (ref 98–111)
Creatinine, Ser: 0.92 mg/dL (ref 0.44–1.00)
GFR, Estimated: >60 mL/min (ref >60)
Glucose, Bld: 88 mg/dL (ref 70–99)
Potassium: 4.1 mmol/L (ref 3.5–5.1)
Sodium: 136 mmol/L (ref 135–145)

## 2022-08-22 LAB — CBC
HCT: 45.3 % (ref 36.0–46.0)
Hemoglobin: 15 g/dL (ref 12.0–15.0)
MCH: 31.1 pg (ref 26.0–34.0)
MCHC: 33.1 g/dL (ref 30.0–36.0)
MCV: 93.8 fL (ref 80.0–100.0)
Platelets: 383 10*3/uL (ref 150–400)
RBC: 4.83 MIL/uL (ref 3.87–5.11)
RDW: 13.5 % (ref 11.5–15.5)
WBC: 10.4 10*3/uL (ref 4.0–10.5)
nRBC: 0 % (ref 0.0–0.2)

## 2022-08-22 LAB — SURGICAL PCR SCREEN
MRSA, PCR: NEGATIVE
Staphylococcus aureus: NEGATIVE

## 2022-09-03 NOTE — Anesthesia Preprocedure Evaluation (Addendum)
Anesthesia Evaluation  Patient identified by MRN, date of birth, ID band Patient awake    Reviewed: Allergy & Precautions, NPO status , Patient's Chart, lab work & pertinent test results  History of Anesthesia Complications (+) PONV and history of anesthetic complications  Airway Mallampati: II  TM Distance: >3 FB Neck ROM: Full  Mouth opening: Limited Mouth Opening  Dental no notable dental hx. (+) Teeth Intact, Dental Advisory Given   Pulmonary Current Smoker and Patient abstained from smoking.   Pulmonary exam normal breath sounds clear to auscultation       Cardiovascular Normal cardiovascular exam Rhythm:Regular Rate:Normal     Neuro/Psych  Headaches    GI/Hepatic   Endo/Other  Hypothyroidism    Renal/GU      Musculoskeletal  (+) Arthritis , Osteoarthritis,  Fibromyalgia -  Abdominal   Peds  Hematology   Anesthesia Other Findings   Reproductive/Obstetrics                             Anesthesia Physical Anesthesia Plan  ASA: 2  Anesthesia Plan: Regional and Spinal   Post-op Pain Management: Regional block* and Minimal or no pain anticipated   Induction:   PONV Risk Score and Plan: Propofol infusion, Treatment may vary due to age or medical condition, Midazolam and Ondansetron  Airway Management Planned: Natural Airway and Nasal Cannula  Additional Equipment: None  Intra-op Plan:   Post-operative Plan:   Informed Consent: I have reviewed the patients History and Physical, chart, labs and discussed the procedure including the risks, benefits and alternatives for the proposed anesthesia with the patient or authorized representative who has indicated his/her understanding and acceptance.     Dental advisory given  Plan Discussed with:   Anesthesia Plan Comments: (Spinal W L adductor Canal)        Anesthesia Quick Evaluation

## 2022-09-04 ENCOUNTER — Ambulatory Visit (HOSPITAL_BASED_OUTPATIENT_CLINIC_OR_DEPARTMENT_OTHER): Payer: Medicare Other | Admitting: Anesthesiology

## 2022-09-04 ENCOUNTER — Ambulatory Visit (HOSPITAL_COMMUNITY): Payer: Medicare Other | Admitting: Anesthesiology

## 2022-09-04 ENCOUNTER — Encounter (HOSPITAL_COMMUNITY): Payer: Self-pay | Admitting: Orthopedic Surgery

## 2022-09-04 ENCOUNTER — Observation Stay (HOSPITAL_COMMUNITY)
Admission: RE | Admit: 2022-09-04 | Discharge: 2022-09-05 | Disposition: A | Discharge status: Home / Self Care | Payer: Medicare Other | Source: Ambulatory Visit | Attending: Orthopedic Surgery | Admitting: Orthopedic Surgery

## 2022-09-04 ENCOUNTER — Encounter (HOSPITAL_COMMUNITY)
Admission: RE | Disposition: A | Discharge status: Home / Self Care | Payer: Self-pay | Source: Ambulatory Visit | Attending: Orthopedic Surgery

## 2022-09-04 ENCOUNTER — Other Ambulatory Visit: Payer: Self-pay

## 2022-09-04 DIAGNOSIS — M1712 Unilateral primary osteoarthritis, left knee: Secondary | ICD-10-CM | POA: Diagnosis not present

## 2022-09-04 DIAGNOSIS — M179 Osteoarthritis of knee, unspecified: Secondary | ICD-10-CM | POA: Diagnosis present

## 2022-09-04 DIAGNOSIS — F17210 Nicotine dependence, cigarettes, uncomplicated: Secondary | ICD-10-CM | POA: Insufficient documentation

## 2022-09-04 DIAGNOSIS — E039 Hypothyroidism, unspecified: Secondary | ICD-10-CM | POA: Insufficient documentation

## 2022-09-04 DIAGNOSIS — M1990 Unspecified osteoarthritis, unspecified site: Secondary | ICD-10-CM | POA: Diagnosis present

## 2022-09-04 DIAGNOSIS — Z79899 Other long term (current) drug therapy: Secondary | ICD-10-CM | POA: Diagnosis not present

## 2022-09-04 DIAGNOSIS — G8918 Other acute postprocedural pain: Secondary | ICD-10-CM | POA: Diagnosis not present

## 2022-09-04 HISTORY — PX: TOTAL KNEE ARTHROPLASTY: SHX125

## 2022-09-04 SURGERY — ARTHROPLASTY, KNEE, TOTAL
Anesthesia: Regional | Site: Knee | Laterality: Left

## 2022-09-04 MED ORDER — BUPIVACAINE LIPOSOME 1.3 % IJ SUSP: 20.0000 mL | Freq: Once | INTRAMUSCULAR | Status: DC

## 2022-09-04 MED ORDER — ONDANSETRON HCL 4 MG/2ML IJ SOLN: 4.0000 mg | Freq: Once | INTRAMUSCULAR | Status: DC | PRN

## 2022-09-04 MED ORDER — METHOCARBAMOL 500 MG PO TABS
500.0000 mg | ORAL_TABLET | Freq: Four times a day (QID) | ORAL | Status: DC | PRN
  Administered 2022-09-04: 500 mg via ORAL
  Filled 2022-09-04: qty 1

## 2022-09-04 MED ORDER — AMITRIPTYLINE HCL 50 MG PO TABS
50.0000 mg | ORAL_TABLET | Freq: Every day | ORAL | Status: DC
  Administered 2022-09-04: 50 mg via ORAL
  Filled 2022-09-04: qty 1

## 2022-09-04 MED ORDER — HYDROMORPHONE HCL 1 MG/ML IJ SOLN: 0.2500 mg | INTRAMUSCULAR | Status: DC | PRN

## 2022-09-04 MED ORDER — PROPOFOL 1000 MG/100ML IV EMUL
INTRAVENOUS | Status: AC
  Filled 2022-09-04: qty 100

## 2022-09-04 MED ORDER — ONDANSETRON HCL 4 MG/2ML IJ SOLN: 4.0000 mg | Freq: Four times a day (QID) | INTRAMUSCULAR | Status: DC | PRN

## 2022-09-04 MED ORDER — OXYCODONE HCL 5 MG PO TABS: 5.0000 mg | ORAL_TABLET | Freq: Once | ORAL | Status: DC | PRN

## 2022-09-04 MED ORDER — SODIUM CHLORIDE (PF) 0.9 % IJ SOLN
INTRAMUSCULAR | Status: AC
  Filled 2022-09-04: qty 10

## 2022-09-04 MED ORDER — POVIDONE-IODINE 10 % EX SWAB: 2.0000 | Freq: Once | CUTANEOUS | Status: DC

## 2022-09-04 MED ORDER — BUPIVACAINE LIPOSOME 1.3 % IJ SUSP
INTRAMUSCULAR | Status: AC
  Filled 2022-09-04: qty 20

## 2022-09-04 MED ORDER — CLONIDINE HCL (ANALGESIA) 100 MCG/ML EP SOLN
EPIDURAL | Status: DC | PRN
  Administered 2022-09-04: 100 ug

## 2022-09-04 MED ORDER — ORAL CARE MOUTH RINSE: 15.0000 mL | Freq: Once | OROMUCOSAL | Status: AC

## 2022-09-04 MED ORDER — MORPHINE SULFATE (PF) 2 MG/ML IV SOLN
1.0000 mg | INTRAVENOUS | Status: DC | PRN
  Administered 2022-09-04: 1 mg via INTRAVENOUS
  Filled 2022-09-04: qty 1

## 2022-09-04 MED ORDER — SODIUM CHLORIDE 0.9 % IV SOLN: INTRAVENOUS | Status: DC

## 2022-09-04 MED ORDER — PHENYLEPHRINE HCL-NACL 20-0.9 MG/250ML-% IV SOLN
INTRAVENOUS | Status: DC | PRN
  Administered 2022-09-04: 25 ug/min via INTRAVENOUS

## 2022-09-04 MED ORDER — FENTANYL CITRATE PF 50 MCG/ML IJ SOSY
50.0000 ug | PREFILLED_SYRINGE | INTRAMUSCULAR | Status: DC
  Administered 2022-09-04: 100 ug via INTRAVENOUS
  Filled 2022-09-04: qty 2

## 2022-09-04 MED ORDER — ONDANSETRON HCL 4 MG/2ML IJ SOLN
INTRAMUSCULAR | Status: DC | PRN
  Administered 2022-09-04: 4 mg via INTRAVENOUS

## 2022-09-04 MED ORDER — PROPOFOL 500 MG/50ML IV EMUL
INTRAVENOUS | Status: DC | PRN
  Administered 2022-09-04: 65 ug/kg/min via INTRAVENOUS
  Administered 2022-09-04 (×2): 25 mg via INTRAVENOUS

## 2022-09-04 MED ORDER — PROPRANOLOL HCL ER 80 MG PO CP24
160.0000 mg | ORAL_CAPSULE | Freq: Every day | ORAL | Status: DC
  Administered 2022-09-04: 160 mg via ORAL
  Filled 2022-09-04: qty 2

## 2022-09-04 MED ORDER — ACETAMINOPHEN 500 MG PO TABS
1000.0000 mg | ORAL_TABLET | Freq: Four times a day (QID) | ORAL | Status: AC
  Administered 2022-09-04 – 2022-09-05 (×4): 1000 mg via ORAL
  Filled 2022-09-04 (×4): qty 2

## 2022-09-04 MED ORDER — ALPRAZOLAM 0.5 MG PO TABS: 0.5000 mg | ORAL_TABLET | Freq: Two times a day (BID) | ORAL | Status: DC | PRN

## 2022-09-04 MED ORDER — OXYCODONE HCL 5 MG/5ML PO SOLN: 5.0000 mg | Freq: Once | ORAL | Status: DC | PRN

## 2022-09-04 MED ORDER — GABAPENTIN 300 MG PO CAPS
300.0000 mg | ORAL_CAPSULE | Freq: Three times a day (TID) | ORAL | Status: DC
  Administered 2022-09-04 – 2022-09-05 (×3): 300 mg via ORAL
  Filled 2022-09-04 (×3): qty 1

## 2022-09-04 MED ORDER — SODIUM CHLORIDE (PF) 0.9 % IJ SOLN
INTRAMUSCULAR | Status: AC
  Filled 2022-09-04: qty 50

## 2022-09-04 MED ORDER — OXYCODONE HCL 5 MG PO TABS
5.0000 mg | ORAL_TABLET | ORAL | Status: DC | PRN
  Administered 2022-09-04: 10 mg via ORAL
  Administered 2022-09-04: 5 mg via ORAL
  Administered 2022-09-05 (×3): 10 mg via ORAL
  Filled 2022-09-04: qty 1
  Filled 2022-09-04 (×4): qty 2

## 2022-09-04 MED ORDER — POLYETHYLENE GLYCOL 3350 17 G PO PACK: 17.0000 g | PACK | Freq: Every day | ORAL | Status: DC | PRN

## 2022-09-04 MED ORDER — FLUTICASONE PROPIONATE 50 MCG/ACT NA SUSP: 2.0000 | Freq: Every day | NASAL | Status: DC | PRN

## 2022-09-04 MED ORDER — DOCUSATE SODIUM 100 MG PO CAPS
100.0000 mg | ORAL_CAPSULE | Freq: Two times a day (BID) | ORAL | Status: DC
  Administered 2022-09-04 – 2022-09-05 (×2): 100 mg via ORAL
  Filled 2022-09-04 (×2): qty 1

## 2022-09-04 MED ORDER — PROPRANOLOL HCL ER 160 MG PO CP24
160.0000 mg | ORAL_CAPSULE | Freq: Every day | ORAL | Status: DC
  Filled 2022-09-04: qty 1

## 2022-09-04 MED ORDER — SODIUM CHLORIDE 0.9 % IR SOLN
Status: DC | PRN
  Administered 2022-09-04 (×2): 1000 mL

## 2022-09-04 MED ORDER — ACETAMINOPHEN 10 MG/ML IV SOLN
1000.0000 mg | Freq: Four times a day (QID) | INTRAVENOUS | Status: DC
  Administered 2022-09-04: 1000 mg via INTRAVENOUS
  Filled 2022-09-04: qty 100

## 2022-09-04 MED ORDER — MIDAZOLAM HCL 2 MG/2ML IJ SOLN
1.0000 mg | INTRAMUSCULAR | Status: DC
  Administered 2022-09-04: 2 mg via INTRAVENOUS
  Filled 2022-09-04: qty 2

## 2022-09-04 MED ORDER — LACTATED RINGERS IV SOLN: INTRAVENOUS | Status: DC

## 2022-09-04 MED ORDER — CEFAZOLIN SODIUM-DEXTROSE 2-4 GM/100ML-% IV SOLN
2.0000 g | INTRAVENOUS | Status: AC
  Administered 2022-09-04: 2 g via INTRAVENOUS
  Filled 2022-09-04: qty 100

## 2022-09-04 MED ORDER — METOCLOPRAMIDE HCL 5 MG/ML IJ SOLN: 5.0000 mg | Freq: Three times a day (TID) | INTRAMUSCULAR | Status: DC | PRN

## 2022-09-04 MED ORDER — CHLORHEXIDINE GLUCONATE 0.12 % MT SOLN
15.0000 mL | Freq: Once | OROMUCOSAL | Status: AC
  Administered 2022-09-04: 15 mL via OROMUCOSAL

## 2022-09-04 MED ORDER — LEVOTHYROXINE SODIUM 112 MCG PO TABS: 112.0000 ug | ORAL_TABLET | ORAL | Status: DC

## 2022-09-04 MED ORDER — DEXAMETHASONE SODIUM PHOSPHATE 10 MG/ML IJ SOLN
8.0000 mg | Freq: Once | INTRAMUSCULAR | Status: AC
  Administered 2022-09-04: 8 mg via INTRAVENOUS

## 2022-09-04 MED ORDER — DEXAMETHASONE SODIUM PHOSPHATE 10 MG/ML IJ SOLN
10.0000 mg | Freq: Once | INTRAMUSCULAR | Status: AC
  Administered 2022-09-05: 10 mg via INTRAVENOUS
  Filled 2022-09-04: qty 1

## 2022-09-04 MED ORDER — DIPHENHYDRAMINE HCL 12.5 MG/5ML PO ELIX: 12.5000 mg | ORAL_SOLUTION | ORAL | Status: DC | PRN

## 2022-09-04 MED ORDER — TRAMADOL HCL 50 MG PO TABS: 50.0000 mg | ORAL_TABLET | Freq: Four times a day (QID) | ORAL | Status: DC | PRN

## 2022-09-04 MED ORDER — CEFAZOLIN SODIUM-DEXTROSE 2-4 GM/100ML-% IV SOLN
2.0000 g | Freq: Four times a day (QID) | INTRAVENOUS | Status: AC
  Administered 2022-09-04 (×2): 2 g via INTRAVENOUS
  Filled 2022-09-04 (×2): qty 100

## 2022-09-04 MED ORDER — METHOCARBAMOL 500 MG IVPB - SIMPLE MED: 500.0000 mg | Freq: Four times a day (QID) | INTRAVENOUS | Status: DC | PRN

## 2022-09-04 MED ORDER — BISACODYL 10 MG RE SUPP: 10.0000 mg | Freq: Every day | RECTAL | Status: DC | PRN

## 2022-09-04 MED ORDER — SODIUM CHLORIDE (PF) 0.9 % IJ SOLN
INTRAMUSCULAR | Status: DC | PRN
  Administered 2022-09-04: 60 mL

## 2022-09-04 MED ORDER — GEMFIBROZIL 600 MG PO TABS
600.0000 mg | ORAL_TABLET | Freq: Two times a day (BID) | ORAL | Status: DC
  Administered 2022-09-04: 600 mg via ORAL
  Filled 2022-09-04 (×2): qty 1

## 2022-09-04 MED ORDER — ONDANSETRON HCL 4 MG/2ML IJ SOLN
INTRAMUSCULAR | Status: AC
  Filled 2022-09-04: qty 2

## 2022-09-04 MED ORDER — METOCLOPRAMIDE HCL 5 MG PO TABS: 5.0000 mg | ORAL_TABLET | Freq: Three times a day (TID) | ORAL | Status: DC | PRN

## 2022-09-04 MED ORDER — ACETAMINOPHEN 10 MG/ML IV SOLN: 1000.0000 mg | Freq: Once | INTRAVENOUS | Status: DC | PRN

## 2022-09-04 MED ORDER — BUPIVACAINE IN DEXTROSE 0.75-8.25 % IT SOLN
INTRATHECAL | Status: DC | PRN
  Administered 2022-09-04: 1.6 mL via INTRATHECAL

## 2022-09-04 MED ORDER — SUMATRIPTAN SUCCINATE 50 MG PO TABS: 100.0000 mg | ORAL_TABLET | ORAL | Status: DC | PRN

## 2022-09-04 MED ORDER — PHENOL 1.4 % MT LIQD: 1.0000 | OROMUCOSAL | Status: DC | PRN

## 2022-09-04 MED ORDER — DEXAMETHASONE SODIUM PHOSPHATE 10 MG/ML IJ SOLN
INTRAMUSCULAR | Status: AC
  Filled 2022-09-04: qty 1

## 2022-09-04 MED ORDER — ROPIVACAINE HCL 5 MG/ML IJ SOLN
INTRAMUSCULAR | Status: DC | PRN
  Administered 2022-09-04: 30 mL via PERINEURAL

## 2022-09-04 MED ORDER — MENTHOL 3 MG MT LOZG: 1.0000 | LOZENGE | OROMUCOSAL | Status: DC | PRN

## 2022-09-04 MED ORDER — LEVOTHYROXINE SODIUM 100 MCG PO TABS
100.0000 ug | ORAL_TABLET | ORAL | Status: DC
  Administered 2022-09-05: 100 ug via ORAL
  Filled 2022-09-04: qty 1

## 2022-09-04 MED ORDER — ASPIRIN 325 MG PO TBEC
325.0000 mg | DELAYED_RELEASE_TABLET | Freq: Two times a day (BID) | ORAL | Status: DC
  Administered 2022-09-04 – 2022-09-05 (×2): 325 mg via ORAL
  Filled 2022-09-04 (×2): qty 1

## 2022-09-04 MED ORDER — FLEET ENEMA 7-19 GM/118ML RE ENEM: 1.0000 | ENEMA | Freq: Once | RECTAL | Status: DC | PRN

## 2022-09-04 MED ORDER — FAMOTIDINE 20 MG PO TABS
20.0000 mg | ORAL_TABLET | Freq: Two times a day (BID) | ORAL | Status: DC
  Administered 2022-09-04 – 2022-09-05 (×2): 20 mg via ORAL
  Filled 2022-09-04 (×2): qty 1

## 2022-09-04 MED ORDER — ONDANSETRON HCL 4 MG PO TABS: 4.0000 mg | ORAL_TABLET | Freq: Four times a day (QID) | ORAL | Status: DC | PRN

## 2022-09-04 MED ORDER — TRANEXAMIC ACID-NACL 1000-0.7 MG/100ML-% IV SOLN
1000.0000 mg | INTRAVENOUS | Status: AC
  Administered 2022-09-04: 1000 mg via INTRAVENOUS
  Filled 2022-09-04: qty 100

## 2022-09-04 MED ORDER — BUPIVACAINE LIPOSOME 1.3 % IJ SUSP
INTRAMUSCULAR | Status: DC | PRN
  Administered 2022-09-04: 20 mL

## 2022-09-04 MED ORDER — RIZATRIPTAN BENZOATE 10 MG PO TBDP: 10.0000 mg | ORAL_TABLET | ORAL | Status: DC | PRN

## 2022-09-04 SURGICAL SUPPLY — 55 items
ATTUNE MED DOME PAT 38 KNEE (Knees) IMPLANT
ATTUNE PSFEM LTSZ4 NARCEM KNEE (Femur) IMPLANT
ATTUNE PSRP INSR SZ4 12 KNEE (Insert) IMPLANT
BAG COUNTER SPONGE SURGICOUNT (BAG) IMPLANT
BAG ZIPLOCK 12X15 (MISCELLANEOUS) ×1 IMPLANT
BASEPLATE TIBIAL ROTATING SZ 4 (Knees) IMPLANT
BLADE SAG 18X100X1.27 (BLADE) ×1 IMPLANT
BLADE SAW SGTL 11.0X1.19X90.0M (BLADE) ×1 IMPLANT
BNDG ELASTIC 6X5.8 VLCR STR LF (GAUZE/BANDAGES/DRESSINGS) ×1 IMPLANT
BOWL SMART MIX CTS (DISPOSABLE) ×1 IMPLANT
CEMENT HV SMART SET (Cement) ×2 IMPLANT
COVER SURGICAL LIGHT HANDLE (MISCELLANEOUS) ×1 IMPLANT
CUFF TOURN SGL QUICK 34 (TOURNIQUET CUFF) ×1
CUFF TRNQT CYL 34X4.125X (TOURNIQUET CUFF) ×1 IMPLANT
DRAPE INCISE IOBAN 66X45 STRL (DRAPES) ×1 IMPLANT
DRAPE U-SHAPE 47X51 STRL (DRAPES) ×1 IMPLANT
DRSG AQUACEL AG ADV 3.5X10 (GAUZE/BANDAGES/DRESSINGS) ×1 IMPLANT
DURAPREP 26ML APPLICATOR (WOUND CARE) ×1 IMPLANT
ELECT REM PT RETURN 15FT ADLT (MISCELLANEOUS) ×1 IMPLANT
GLOVE BIO SURGEON STRL SZ 6.5 (GLOVE) IMPLANT
GLOVE BIO SURGEON STRL SZ7.5 (GLOVE) IMPLANT
GLOVE BIO SURGEON STRL SZ8 (GLOVE) ×1 IMPLANT
GLOVE BIOGEL PI IND STRL 6.5 (GLOVE) IMPLANT
GLOVE BIOGEL PI IND STRL 7.0 (GLOVE) IMPLANT
GLOVE BIOGEL PI IND STRL 8 (GLOVE) ×1 IMPLANT
GOWN STRL REUS W/ TWL LRG LVL3 (GOWN DISPOSABLE) ×1 IMPLANT
GOWN STRL REUS W/ TWL XL LVL3 (GOWN DISPOSABLE) IMPLANT
GOWN STRL REUS W/TWL LRG LVL3 (GOWN DISPOSABLE) ×1
GOWN STRL REUS W/TWL XL LVL3 (GOWN DISPOSABLE)
HANDPIECE INTERPULSE COAX TIP (DISPOSABLE) ×1
HOLDER FOLEY CATH W/STRAP (MISCELLANEOUS) IMPLANT
IMMOBILIZER KNEE 20 (SOFTGOODS) ×1
IMMOBILIZER KNEE 20 THIGH 36 (SOFTGOODS) ×1 IMPLANT
KIT TURNOVER KIT A (KITS) IMPLANT
MANIFOLD NEPTUNE II (INSTRUMENTS) ×1 IMPLANT
NDL FILTER BLUNT 18X1 1/2 (NEEDLE) IMPLANT
NEEDLE FILTER BLUNT 18X1 1/2 (NEEDLE) ×1 IMPLANT
NS IRRIG 1000ML POUR BTL (IV SOLUTION) ×1 IMPLANT
PACK TOTAL KNEE CUSTOM (KITS) ×1 IMPLANT
PADDING CAST COTTON 6X4 STRL (CAST SUPPLIES) ×2 IMPLANT
PIN STEINMAN FIXATION KNEE (PIN) IMPLANT
PROTECTOR NERVE ULNAR (MISCELLANEOUS) ×1 IMPLANT
SET HNDPC FAN SPRY TIP SCT (DISPOSABLE) ×1 IMPLANT
SPIKE FLUID TRANSFER (MISCELLANEOUS) ×1 IMPLANT
STRIP CLOSURE SKIN 1/2X4 (GAUZE/BANDAGES/DRESSINGS) ×2 IMPLANT
SUT MNCRL AB 4-0 PS2 18 (SUTURE) ×1 IMPLANT
SUT STRATAFIX 0 PDS 27 VIOLET (SUTURE) ×1
SUT VIC AB 2-0 CT1 27 (SUTURE) ×3
SUT VIC AB 2-0 CT1 TAPERPNT 27 (SUTURE) ×3 IMPLANT
SUTURE STRATFX 0 PDS 27 VIOLET (SUTURE) ×1 IMPLANT
SYR 50ML SLIP (SYRINGE) IMPLANT
TRAY FOLEY MTR SLVR 16FR STAT (SET/KITS/TRAYS/PACK) ×1 IMPLANT
TUBE SUCTION HIGH CAP CLEAR NV (SUCTIONS) ×1 IMPLANT
WATER STERILE IRR 1000ML POUR (IV SOLUTION) ×2 IMPLANT
WRAP KNEE MAXI GEL POST OP (GAUZE/BANDAGES/DRESSINGS) ×1 IMPLANT

## 2022-09-04 NOTE — Anesthesia Procedure Notes (Addendum)
Anesthesia Regional Block: Adductor canal block   Pre-Anesthetic Checklist: , timeout performed,  Correct Patient, Correct Site, Correct Laterality,  Correct Procedure, Correct Position, site marked,  Risks and benefits discussed,  Surgical consent,  Pre-op evaluation,  At surgeon's request and post-op pain management  Laterality: Left and Lower  Prep: chloraprep       Needles:  Injection technique: Single-shot  Needle Type: Echogenic Needle     Needle Length: 9cm  Needle Gauge: 22     Additional Needles:   Procedures:,,,, ultrasound used (permanent image in chart),,    Narrative:  Start time: 09/04/2022 10:49 AM End time: 09/04/2022 10:54 AM Injection made incrementally with aspirations every 5 mL.  Performed by: Personally  Anesthesiologist: Barnet Glasgow, MD  Additional Notes: Block assessed prior to surgery. Pt tolerated procedure well.

## 2022-09-04 NOTE — Anesthesia Procedure Notes (Signed)
Procedure Name: MAC Date/Time: 09/04/2022 11:43 AM  Performed by: Victoriano Lain, CRNAPre-anesthesia Checklist: Patient identified, Emergency Drugs available, Suction available, Patient being monitored and Timeout performed Patient Re-evaluated:Patient Re-evaluated prior to induction Oxygen Delivery Method: Simple face mask Placement Confirmation: positive ETCO2 Dental Injury: Teeth and Oropharynx as per pre-operative assessment

## 2022-09-04 NOTE — Transfer of Care (Signed)
Immediate Anesthesia Transfer of Care Note  Patient: [name] [name]  Procedure(s) Performed: TOTAL KNEE ARTHROPLASTY (Left: Knee)  Patient Location: PACU  Anesthesia Type:MAC and Spinal  Level of Consciousness: awake, alert , oriented, and patient cooperative  Airway & Oxygen Therapy: Patient Spontanous Breathing and Patient connected to face mask oxygen  Post-op Assessment: Report given to RN and Post -op Vital signs reviewed and stable  Post vital signs: Reviewed and stable  Last Vitals:  Vitals Value Taken Time  BP    Temp    Pulse 73 09/04/22 1315  Resp 12 09/04/22 1315  SpO2 100 % 09/04/22 1315  Vitals shown include unvalidated device data.  Last Pain:  Vitals:   09/04/22 1130  TempSrc:   PainSc: 0-No pain         Complications: No notable events documented.

## 2022-09-04 NOTE — Interval H&P Note (Signed)
History and Physical Interval Note:  09/04/2022 10:29 AM  [name] [name]  has presented today for surgery, with the diagnosis of left knee osteoarthritis.  The various methods of treatment have been discussed with the patient and family. After consideration of risks, benefits and other options for treatment, the patient has consented to  Procedure(s): TOTAL KNEE ARTHROPLASTY (Left) as a surgical intervention.  The patient's history has been reviewed, patient examined, no change in status, stable for surgery.  I have reviewed the patient's chart and labs.  Questions were answered to the patient's satisfaction.     Pilar Plate Alima Naser

## 2022-09-04 NOTE — Progress Notes (Signed)
Orthopedic Tech Progress Note Patient Details:  [name] [name] [birth year] [id number]  CPM Left Knee CPM Left Knee: Off Left Knee Flexion (Degrees): 40 Left Knee Extension (Degrees): 10  Post Interventions Patient Tolerated: Well  Vernona Rieger 09/04/2022, 5:47 PM

## 2022-09-04 NOTE — Discharge Instructions (Signed)
 Frank Aluisio, MD Total Joint Specialist [facility] [place] [address] [place], [place] [address] [phone]  TOTAL KNEE REPLACEMENT POSTOPERATIVE DIRECTIONS    Knee Rehabilitation, Guidelines Following Surgery  Results after knee surgery are often greatly improved when you follow the exercise, range of motion and muscle strengthening exercises prescribed by your doctor. Safety measures are also important to protect the knee from further injury. If any of these exercises cause you to have increased pain or swelling in your knee joint, decrease the amount until you are comfortable again and slowly increase them. If you have problems or questions, call your caregiver or physical therapist for advice.   BLOOD CLOT PREVENTION Take a 325 mg Aspirin two times a day for three weeks following surgery. Then take an 81 mg Aspirin once a day for three weeks. Then discontinue Aspirin. You may resume your vitamins/supplements upon discharge from the hospital. Do not take any NSAIDs (Advil, Aleve, Ibuprofen, Meloxicam, etc.) until you have discontinued the 325 mg Aspirin.  HOME CARE INSTRUCTIONS  Remove items at home which could result in a fall. This includes throw rugs or furniture in walking pathways.  ICE to the affected knee as much as tolerated. Icing helps control swelling. If the swelling is well controlled you will be more comfortable and rehab easier. Continue to use ice on the knee for pain and swelling from surgery. You may notice swelling that will progress down to the foot and ankle. This is normal after surgery. Elevate the leg when you are not up walking on it.    Continue to use the breathing machine which will help keep your temperature down. It is common for your temperature to cycle up and down following surgery, especially at night when you are not up moving around and exerting yourself. The breathing machine keeps your lungs expanded and your temperature  down. Do not place pillow under the operative knee, focus on keeping the knee straight while resting  DIET You may resume your previous home diet once you are discharged from the hospital.  DRESSING / WOUND CARE / SHOWERING Keep your bulky bandage on for 2 days. On the third post-operative day you may remove the Ace bandage and gauze. There is a waterproof adhesive bandage on your skin which will stay in place until your first follow-up appointment. Once you remove this you will not need to place another bandage You may begin showering 3 days following surgery, but do not submerge the incision under water.  ACTIVITY For the first 5 days, the key is rest and control of pain and swelling Do your home exercises twice a day starting on post-operative day 3. On the days you go to physical therapy, just do the home exercises once that day. You should rest, ice and elevate the leg for 50 minutes out of every hour. Get up and walk/stretch for 10 minutes per hour. After 5 days you can increase your activity slowly as tolerated. Walk with your walker as instructed. Use the walker until you are comfortable transitioning to a cane. Walk with the cane in the opposite hand of the operative leg. You may discontinue the cane once you are comfortable and walking steadily. Avoid periods of inactivity such as sitting longer than an hour when not asleep. This helps prevent blood clots.  You may discontinue the knee immobilizer once you are able to perform a straight leg raise while lying down. You may resume a sexual relationship in one month   or when given the OK by your doctor.  You may return to work once you are cleared by your doctor.  Do not drive a car for 6 weeks or until released by your surgeon.  Do not drive while taking narcotics.  TED HOSE STOCKINGS Wear the elastic stockings on both legs for three weeks following surgery during the day. You may remove them at night for sleeping.  WEIGHT  BEARING Weight bearing as tolerated with assist device (walker, cane, etc) as directed, use it as long as suggested by your surgeon or therapist, typically at least 4-6 weeks.  POSTOPERATIVE CONSTIPATION PROTOCOL Constipation - defined medically as fewer than three stools per week and severe constipation as less than one stool per week.  One of the most common issues patients have following surgery is constipation.  Even if you have a regular bowel pattern at home, your normal regimen is likely to be disrupted due to multiple reasons following surgery.  Combination of anesthesia, postoperative narcotics, change in appetite and fluid intake all can affect your bowels.  In order to avoid complications following surgery, here are some recommendations in order to help you during your recovery period.  Colace (docusate) - Pick up an over-the-counter form of Colace or another stool softener and take twice a day as long as you are requiring postoperative pain medications.  Take with a full glass of water daily.  If you experience loose stools or diarrhea, hold the colace until you stool forms back up. If your symptoms do not get better within 1 week or if they get worse, check with your doctor. Dulcolax (bisacodyl) - Pick up over-the-counter and take as directed by the product packaging as needed to assist with the movement of your bowels.  Take with a full glass of water.  Use this product as needed if not relieved by Colace only.  MiraLax (polyethylene glycol) - Pick up over-the-counter to have on hand. MiraLax is a solution that will increase the amount of water in your bowels to assist with bowel movements.  Take as directed and can mix with a glass of water, juice, soda, coffee, or tea. Take if you go more than two days without a movement. Do not use MiraLax more than once per day. Call your doctor if you are still constipated or irregular after using this medication for 7 days in a row.  If you continue  to have problems with postoperative constipation, please contact the office for further assistance and recommendations.  If you experience "the worst abdominal pain ever" or develop nausea or vomiting, please contact the office immediatly for further recommendations for treatment.  ITCHING If you experience itching with your medications, try taking only a single pain pill, or even half a pain pill at a time.  You can also use Benadryl over the counter for itching or also to help with sleep.   MEDICATIONS See your medication summary on the "After Visit Summary" that the nursing staff will review with you prior to discharge.  You may have some home medications which will be placed on hold until you complete the course of blood thinner medication.  It is important for you to complete the blood thinner medication as prescribed by your surgeon.  Continue your approved medications as instructed at time of discharge.  PRECAUTIONS If you experience chest pain or shortness of breath - call 911 immediately for transfer to the hospital emergency department.  If you develop a fever greater that 101 F,   purulent drainage from wound, increased redness or drainage from wound, foul odor from the wound/dressing, or calf pain - CONTACT YOUR SURGEON.                                                   FOLLOW-UP APPOINTMENTS Make sure you keep all of your appointments after your operation with your surgeon and caregivers. You should call the office at the above phone number and make an appointment for approximately two weeks after the date of your surgery or on the date instructed by your surgeon outlined in the "After Visit Summary".  RANGE OF MOTION AND STRENGTHENING EXERCISES  Rehabilitation of the knee is important following a knee injury or an operation. After just a few days of immobilization, the muscles of the thigh which control the knee become weakened and shrink (atrophy). Knee exercises are designed to build up  the tone and strength of the thigh muscles and to improve knee motion. Often times heat used for twenty to thirty minutes before working out will loosen up your tissues and help with improving the range of motion but do not use heat for the first two weeks following surgery. These exercises can be done on a training (exercise) mat, on the floor, on a table or on a bed. Use what ever works the best and is most comfortable for you Knee exercises include:  Leg Lifts - While your knee is still immobilized in a splint or cast, you can do straight leg raises. Lift the leg to 60 degrees, hold for 3 sec, and slowly lower the leg. Repeat 10-20 times 2-3 times daily. Perform this exercise against resistance later as your knee gets better.  Quad and Hamstring Sets - Tighten up the muscle on the front of the thigh (Quad) and hold for 5-10 sec. Repeat this 10-20 times hourly. Hamstring sets are done by pushing the foot backward against an object and holding for 5-10 sec. Repeat as with quad sets.  Leg Slides: Lying on your back, slowly slide your foot toward your buttocks, bending your knee up off the floor (only go as far as is comfortable). Then slowly slide your foot back down until your leg is flat on the floor again. Angel Wings: Lying on your back spread your legs to the side as far apart as you can without causing discomfort.  A rehabilitation program following serious knee injuries can speed recovery and prevent re-injury in the future due to weakened muscles. Contact your doctor or a physical therapist for more information on knee rehabilitation.   POST-OPERATIVE OPIOID TAPER INSTRUCTIONS: It is important to wean off of your opioid medication as soon as possible. If you do not need pain medication after your surgery it is ok to stop day one. Opioids include: Codeine, Hydrocodone(Norco, Vicodin), Oxycodone(Percocet, oxycontin) and hydromorphone amongst others.  Long term and even short term use of opiods can  cause: Increased pain response Dependence Constipation Depression Respiratory depression And more.  Withdrawal symptoms can include Flu like symptoms Nausea, vomiting And more Techniques to manage these symptoms Hydrate well Eat regular healthy meals Stay active Use relaxation techniques(deep breathing, meditating, yoga) Do Not substitute Alcohol to help with tapering If you have been on opioids for less than two weeks and do not have pain than it is ok to stop all together.  Plan   to wean off of opioids This plan should start within one week post op of your joint replacement. Maintain the same interval or time between taking each dose and first decrease the dose.  Cut the total daily intake of opioids by one tablet each day Next start to increase the time between doses. The last dose that should be eliminated is the evening dose.   IF YOU ARE TRANSFERRED TO A SKILLED REHAB FACILITY If the patient is transferred to a skilled rehab facility following release from the hospital, a list of the current medications will be sent to the facility for the patient to continue.  When discharged from the skilled rehab facility, please have the facility set up the patient's Home Health Physical Therapy prior to being released. Also, the skilled facility will be responsible for providing the patient with their medications at time of release from the facility to include their pain medication, the muscle relaxants, and their blood thinner medication. If the patient is still at the rehab facility at time of the two week follow up appointment, the skilled rehab facility will also need to assist the patient in arranging follow up appointment in our office and any transportation needs.  MAKE SURE YOU:  Understand these instructions.  Get help right away if you are not doing well or get worse.   DENTAL ANTIBIOTICS:  In most cases prophylactic antibiotics for Dental procdeures after total joint surgery are  not necessary.  Exceptions are as follows:  1. History of prior total joint infection  2. Severely immunocompromised (Organ Transplant, cancer chemotherapy, Rheumatoid biologic meds such as Humera)  3. Poorly controlled diabetes (A1C &gt; 8.0, blood glucose over 200)  If you have one of these conditions, contact your surgeon for an antibiotic prescription, prior to your dental procedure.    Pick up stool softner and laxative for home use following surgery while on pain medications. Do not submerge incision under water. Please use good hand washing techniques while changing dressing each day. May shower starting three days after surgery. Please use a clean towel to pat the incision dry following showers. Continue to use ice for pain and swelling after surgery. Do not use any lotions or creams on the incision until instructed by your surgeon.  

## 2022-09-04 NOTE — Anesthesia Procedure Notes (Signed)
Spinal  Start time: 09/04/2022 11:43 AM End time: 09/04/2022 11:48 AM Reason for block: surgical anesthesia Staffing Performed: resident/CRNA  Resident/CRNA: Victoriano Lain, CRNA Performed by: Victoriano Lain, CRNA Authorized by: Barnet Glasgow, MD   Preanesthetic Checklist Completed: patient identified, IV checked, site marked, risks and benefits discussed, surgical consent, monitors and equipment checked, pre-op evaluation and timeout performed Spinal Block Patient position: sitting Prep: DuraPrep and site prepped and draped Patient monitoring: heart rate, cardiac monitor, continuous pulse ox and blood pressure Location: L2-3 Injection technique: single-shot Needle Needle type: Pencan  Needle gauge: 24 G Needle length: 10 cm Assessment Events: CSF return Additional Notes Pt placed in sitting position for spinal placement. Spinal kit expiration date checked and verified. Pt paced on monitors, O2 via FM and sedation for comfort. Sterile prep and drape. Skin anesthetized with local. One attempt by CRNA. Clear free flowing CSF obtained. -heme. Pt tolerated well. Placed supine.

## 2022-09-04 NOTE — Care Plan (Signed)
Ortho Bundle Case Management Note  Patient Details  Name: [name] [name] MRN: [id number] Date of Birth: 09/05/1970  L TKA on 09-04-22 DCP:  Home with husband DME:  RW ordered through [place] PT:  Claud Kelp on 09-06-22                   DME Arranged:  Gilford Rile rolling DME Agency:  [employer]  HH Arranged:  NA Outlook Agency:  NA  Additional Comments: Please contact me with any questions of if this plan should need to change.  Marianne Sofia, RN,CCM [facility]  [phone] 09/04/2022, 4:06 PM

## 2022-09-04 NOTE — Anesthesia Postprocedure Evaluation (Signed)
Anesthesia Post Note  Patient: [name] [name]  Procedure(s) Performed: TOTAL KNEE ARTHROPLASTY (Left: Knee)     Patient location during evaluation: Nursing Unit Anesthesia Type: Regional and Spinal Level of consciousness: oriented and awake and alert Pain management: pain level controlled Vital Signs Assessment: post-procedure vital signs reviewed and stable Respiratory status: spontaneous breathing and respiratory function stable Cardiovascular status: blood pressure returned to baseline and stable Postop Assessment: no headache, no backache, no apparent nausea or vomiting and patient able to bend at knees Anesthetic complications: no  No notable events documented.  Last Vitals:  Vitals:   09/04/22 1415 09/04/22 1430  BP: 101/65 110/76  Pulse: 68 73  Resp: 14 15  Temp:    SpO2: 94% 97%    Last Pain:  Vitals:   09/04/22 1430  TempSrc:   PainSc: 0-No pain                 Barnet Glasgow

## 2022-09-04 NOTE — Plan of Care (Signed)
  Problem: Pain Management: Goal: Pain level will decrease with appropriate interventions Outcome: Progressing   Problem: Activity: Goal: Risk for activity intolerance will decrease Outcome: Progressing   Problem: Safety: Goal: Ability to remain free from injury will improve Outcome: Progressing

## 2022-09-04 NOTE — Op Note (Signed)
OPERATIVE REPORT-TOTAL KNEE ARTHROPLASTY   Pre-operative diagnosis- Osteoarthritis  Left knee(s)  Post-operative diagnosis- Osteoarthritis Left knee(s)  Procedure-  Left  Total Knee Arthroplasty  Surgeon- Dione Plover. Athalee Esterline, MD  Assistant- Molli Barrows, PA-C   Anesthesia-   Adductor canal block and spinal  EBL-50 mL   Drains None  Tourniquet time-  Total Tourniquet Time Documented: Thigh (Left) - 31 minutes Total: Thigh (Left) - 31 minutes     Complications- None  Condition-PACU - hemodynamically stable.   Brief Clinical Note  [name] [name] is a 52 y.o. year old female with end stage OA of her left knee with progressively worsening pain and dysfunction. She has constant pain, with activity and at rest and significant functional deficits with difficulties even with ADLs. She has had extensive non-op management including analgesics, injections of cortisone and viscosupplements, and home exercise program, but remains in significant pain with significant dysfunction. Radiographs show bone on bone arthritis medial and patellofemoral. She presents now for left Total Knee Arthroplasty.     Procedure in detail---   The patient is brought into the operating room and positioned supine on the operating table. After successful administration of  Adductor canal block and spinal,   a tourniquet is placed high on the  Left thigh(s) and the lower extremity is prepped and draped in the usual sterile fashion. Time out is performed by the operating team and then the  Left lower extremity is wrapped in Esmarch, knee flexed and the tourniquet inflated to 300 mmHg.       A midline incision is made with a ten blade through the subcutaneous tissue to the level of the extensor mechanism. A fresh blade is used to make a medial parapatellar arthrotomy. Soft tissue over the proximal medial tibia is subperiosteally elevated to the joint line with a knife and into the semimembranosus bursa with a Cobb  elevator. Soft tissue over the proximal lateral tibia is elevated with attention being paid to avoiding the patellar tendon on the tibial tubercle. The patella is everted, knee flexed 90 degrees and the ACL and PCL are removed. Findings are bone on bone medial and patellofemoral with large global osteophytes        The drill is used to create a starting hole in the distal femur and the canal is thoroughly irrigated with sterile saline to remove the fatty contents. The 5 degree Left  valgus alignment guide is placed into the femoral canal and the distal femoral cutting block is pinned to remove 9 mm off the distal femur. Resection is made with an oscillating saw.      The tibia is subluxed forward and the menisci are removed. The extramedullary alignment guide is placed referencing proximally at the medial aspect of the tibial tubercle and distally along the second metatarsal axis and tibial crest. The block is pinned to remove 11m off the more deficient medial  side. Resection is made with an oscillating saw. Size 4is the most appropriate size for the tibia and the proximal tibia is prepared with the modular drill and keel punch for that size.      The femoral sizing guide is placed and size 4 is most appropriate. Rotation is marked off the epicondylar axis and confirmed by creating a rectangular flexion gap at 90 degrees. The size 4 cutting block is pinned in this rotation and the anterior, posterior and chamfer cuts are made with the oscillating saw. The intercondylar block is then placed and that cut is  made.      Trial size 4 tibial component, trial size 4 narrow posterior stabilized femur and a 12  mm posterior stabilized rotating platform insert trial is placed. Full extension is achieved with excellent varus/valgus and anterior/posterior balance throughout full range of motion. The patella is everted and thickness measured to be 22  mm. Free hand resection is taken to 12 mm, a 38 template is placed, lug  holes are drilled, trial patella is placed, and it tracks normally. Osteophytes are removed off the posterior femur with the trial in place. All trials are removed and the cut bone surfaces prepared with pulsatile lavage. Cement is mixed and once ready for implantation, the size 4 tibial implant, size  4 narrow posterior stabilized femoral component, and the size 38 patella are cemented in place and the patella is held with the clamp. The trial insert is placed and the knee held in full extension. The Exparel (20 ml mixed with 60 ml saline) is injected into the extensor mechanism, posterior capsule, medial and lateral gutters and subcutaneous tissues.  All extruded cement is removed and once the cement is hard the permanent 12 mm posterior stabilized rotating platform insert is placed into the tibial tray.      The wound is copiously irrigated with saline solution and the extensor mechanism closed with # 0 Stratofix suture. The tourniquet is released for a total tourniquet time of 31  minutes. Flexion against gravity is 140 degrees and the patella tracks normally. Subcutaneous tissue is closed with 2.0 vicryl and subcuticular with running 4.0 Monocryl. The incision is cleaned and dried and steri-strips and a bulky sterile dressing are applied. The limb is placed into a knee immobilizer and the patient is awakened and transported to recovery in stable condition.      Please note that a surgical assistant was a medical necessity for this procedure in order to perform it in a safe and expeditious manner. Surgical assistant was necessary to retract the ligaments and vital neurovascular structures to prevent injury to them and also necessary for proper positioning of the limb to allow for anatomic placement of the prosthesis.   Dione Plover [name], MD    09/04/2022, 12:45 PM

## 2022-09-04 NOTE — Evaluation (Signed)
Physical Therapy Evaluation Patient Details Name: [name] [name] MRN: [id number] DOB: 1971-01-28 Today's Date: 09/04/2022  History of Present Illness  Pt is a 52yo female presenting s/p L-TKA on 09/04/22. PMH: fibromyalgia, GERD, HLD, ,  Clinical Impression  [name] [name] is a 52 y.o. female POD 0 s/p L-TKA. Patient reports modified independence using 3WRW (rollator type) with mobility at baseline. Patient is now limited by functional impairments (see PT problem list below) and demonstrated modified independence for bed mobility and min guard for transfers. Patient was able to ambulate 20 feet with RW and min guard level of assist. Patient instructed in exercise to facilitate ROM and circulation to manage edema. Provided incentive spirometer and with Vcs pt able to achieve 1781m. Patient will benefit from continued skilled PT interventions to address impairments and progress towards PLOF. Acute PT will follow to progress mobility and HEP in preparation for safe discharge home.       Recommendations for follow up therapy are one component of a multi-disciplinary discharge planning process, led by the attending physician.  Recommendations may be updated based on patient status, additional functional criteria and insurance authorization.  Follow Up Recommendations Follow physician's recommendations for discharge plan and follow up therapies      Assistance Recommended at Discharge Frequent or constant Supervision/Assistance  Patient can return home with the following  A little help with walking and/or transfers;A little help with bathing/dressing/bathroom;Assistance with cooking/housework;Assist for transportation    Equipment Recommendations Rolling walker (2 wheels)  Recommendations for Other Services       Functional Status Assessment Patient has had a recent decline in their functional status and demonstrates the ability to make significant improvements in function in a reasonable and  predictable amount of time.     Precautions / Restrictions Precautions Precautions: Fall Precaution Comments: Hx of recent falls Required Braces or Orthoses: Knee Immobilizer - Left Knee Immobilizer - Left: On when out of bed or walking;Discontinue post op day 2 Restrictions Weight Bearing Restrictions: No Other Position/Activity Restrictions: wbat      Mobility  Bed Mobility Overal bed mobility: Modified Independent             General bed mobility comments: Increased time    Transfers Overall transfer level: Needs assistance Equipment used: Rolling walker (2 wheels) Transfers: Sit to/from Stand Sit to Stand: Min guard, From elevated surface           General transfer comment: For safety only VCs for sequencing    Ambulation/Gait Ambulation/Gait assistance: Min guard Gait Distance (Feet): 20 Feet Assistive device: Rolling walker (2 wheels) Gait Pattern/deviations: Step-to pattern Gait velocity: decreased     General Gait Details: Pt ambulated with RW and min guard, no physical assist required or overt LOB noted. VCs for sequencing.  Stairs            Wheelchair Mobility    Modified Rankin (Stroke Patients Only)       Balance Overall balance assessment: Needs assistance, History of Falls Sitting-balance support: Feet supported, No upper extremity supported Sitting balance-Leahy Scale: Good     Standing balance support: Reliant on assistive device for balance, During functional activity, Bilateral upper extremity supported Standing balance-Leahy Scale: Poor                               Pertinent Vitals/Pain Pain Assessment Pain Assessment: 0-10 Pain Score: 5  Pain Location: left knee Pain Descriptors / Indicators:  Operative site guarding Pain Intervention(s): Limited activity within patient's tolerance, Monitored during session, Repositioned, Patient requesting pain meds-RN notified, Ice applied    Home Living  Family/patient expects to be discharged to:: Private residence Living Arrangements: Spouse/significant other;Children;Other relatives Available Help at Discharge: Family;Available 24 hours/day Type of Home: House Home Access: Level entry       Home Layout: One level Home Equipment: Shower seat;Toilet riser;Cane - single point      Prior Function Prior Level of Function : Independent/Modified Independent;History of Falls (last six months);Driving             Mobility Comments: Uses 3WRW. Falls: knee buckles and give out ADLs Comments: IND     Hand Dominance        Extremity/Trunk Assessment   Upper Extremity Assessment Upper Extremity Assessment: Overall WFL for tasks assessed    Lower Extremity Assessment Lower Extremity Assessment: RLE deficits/detail;LLE deficits/detail RLE Deficits / Details: MMT ank DF/PF 5/5 RLE Sensation: WNL LLE Deficits / Details: MMT ank DF/PF 5/5, No extensor lag noted LLE Sensation: WNL    Cervical / Trunk Assessment Cervical / Trunk Assessment: Normal  Communication   Communication: No difficulties  Cognition Arousal/Alertness: Awake/alert Behavior During Therapy: WFL for tasks assessed/performed Overall Cognitive Status: Within Functional Limits for tasks assessed                                          General Comments General comments (skin integrity, edema, etc.): Husband Ronalee Belts present    Exercises Total Joint Exercises Ankle Circles/Pumps: AROM, Both, 20 reps Quad Sets: AROM, Left, Other reps (comment) (2) Heel Slides: AROM, Left, Other reps (comment) (2) Goniometric ROM: -5-40deg by gross visual approximation   Assessment/Plan    PT Assessment Patient needs continued PT services  PT Problem List Decreased strength;Decreased range of motion;Decreased activity tolerance;Decreased balance;Decreased mobility;Pain       PT Treatment Interventions DME instruction;Gait training;Stair training;Functional  mobility training;Therapeutic activities;Therapeutic exercise;Balance training;Neuromuscular re-education;Patient/family education    PT Goals (Current goals can be found in the Care Plan section)  Acute Rehab PT Goals Patient Stated Goal: To walk without pain PT Goal Formulation: With patient Time For Goal Achievement: 09/11/22 Potential to Achieve Goals: Good    Frequency 7X/week     Co-evaluation               AM-PAC PT "6 Clicks" Mobility  Outcome Measure Help needed turning from your back to your side while in a flat bed without using bedrails?: None Help needed moving from lying on your back to sitting on the side of a flat bed without using bedrails?: A Little Help needed moving to and from a bed to a chair (including a wheelchair)?: A Little Help needed standing up from a chair using your arms (e.g., wheelchair or bedside chair)?: A Little Help needed to walk in hospital room?: A Little Help needed climbing 3-5 steps with a railing? : A Little 6 Click Score: 19    End of Session Equipment Utilized During Treatment: Gait belt;Left knee immobilizer Activity Tolerance: Patient tolerated treatment well Patient left: in chair;with call bell/phone within reach;with chair alarm set;with family/visitor present;with SCD's reapplied Nurse Communication: Mobility status PT Visit Diagnosis: Pain;Difficulty in walking, not elsewhere classified (R26.2) Pain - Right/Left: Left Pain - part of body: Knee    Time: 9323-5573 PT Time Calculation (min) (ACUTE ONLY): 19 min  Charges:   PT Evaluation $PT Eval Low Complexity: [place], [place], DPT [facility] Office: [phone] Weekend pager: [phone]  Coolidge Breeze 09/04/2022, 6:42 PM

## 2022-09-04 NOTE — Progress Notes (Signed)
Orthopedic Tech Progress Note Patient Details:  [name] [name] 1971-07-20 [id number] 10-40 CPM was applied in PACU.  Patient ID: [name], female   DOB: 01-16-1971, 52 y.o.   MRN: [id number]  [name] 09/04/2022, 1:41 PM

## 2022-09-05 ENCOUNTER — Encounter (HOSPITAL_COMMUNITY): Payer: Self-pay | Admitting: Orthopedic Surgery

## 2022-09-05 DIAGNOSIS — M1712 Unilateral primary osteoarthritis, left knee: Secondary | ICD-10-CM | POA: Diagnosis not present

## 2022-09-05 DIAGNOSIS — Z79899 Other long term (current) drug therapy: Secondary | ICD-10-CM | POA: Diagnosis not present

## 2022-09-05 DIAGNOSIS — Z96652 Presence of left artificial knee joint: Secondary | ICD-10-CM | POA: Diagnosis not present

## 2022-09-05 DIAGNOSIS — E039 Hypothyroidism, unspecified: Secondary | ICD-10-CM | POA: Diagnosis not present

## 2022-09-05 DIAGNOSIS — F17210 Nicotine dependence, cigarettes, uncomplicated: Secondary | ICD-10-CM | POA: Diagnosis not present

## 2022-09-05 LAB — BASIC METABOLIC PANEL
Anion gap: 9 (ref 5–15)
BUN: 8 mg/dL (ref 6–20)
CO2: 22 mmol/L (ref 22–32)
Calcium: 8.9 mg/dL (ref 8.9–10.3)
Chloride: 106 mmol/L (ref 98–111)
Creatinine, Ser: 1 mg/dL (ref 0.44–1.00)
GFR, Estimated: >60 mL/min (ref >60)
Glucose, Bld: 170 mg/dL — ABNORMAL HIGH (ref 70–99)
Potassium: 3.8 mmol/L (ref 3.5–5.1)
Sodium: 137 mmol/L (ref 135–145)

## 2022-09-05 LAB — CBC
HCT: 39.5 % (ref 36.0–46.0)
Hemoglobin: 13.1 g/dL (ref 12.0–15.0)
MCH: 31.2 pg (ref 26.0–34.0)
MCHC: 33.2 g/dL (ref 30.0–36.0)
MCV: 94 fL (ref 80.0–100.0)
Platelets: 266 10*3/uL (ref 150–400)
RBC: 4.2 MIL/uL (ref 3.87–5.11)
RDW: 13.2 % (ref 11.5–15.5)
WBC: 23.5 10*3/uL — ABNORMAL HIGH (ref 4.0–10.5)
nRBC: 0 % (ref 0.0–0.2)

## 2022-09-05 MED ORDER — LIP MEDEX EX OINT
TOPICAL_OINTMENT | CUTANEOUS | Status: DC | PRN
  Administered 2022-09-05: 75 via TOPICAL
  Filled 2022-09-05: qty 7

## 2022-09-05 MED ORDER — OXYCODONE HCL 5 MG PO TABS: 5.0000 mg | ORAL_TABLET | Freq: Four times a day (QID) | ORAL | 0 refills | Status: DC | PRN

## 2022-09-05 MED ORDER — GABAPENTIN 300 MG PO CAPS: ORAL_CAPSULE | ORAL | 0 refills | Status: DC

## 2022-09-05 MED ORDER — TRAMADOL HCL 50 MG PO TABS: 50.0000 mg | ORAL_TABLET | Freq: Four times a day (QID) | ORAL | 0 refills | Status: DC | PRN

## 2022-09-05 MED ORDER — METHOCARBAMOL 500 MG PO TABS: 500.0000 mg | ORAL_TABLET | Freq: Four times a day (QID) | ORAL | 0 refills | Status: DC | PRN

## 2022-09-05 MED ORDER — ASPIRIN 325 MG PO TBEC: 325.0000 mg | DELAYED_RELEASE_TABLET | Freq: Two times a day (BID) | ORAL | 0 refills | Status: AC

## 2022-09-05 NOTE — Progress Notes (Signed)
Physical Therapy Treatment Patient Details Name: [name] [name] MRN: [id number] DOB: 1971/05/09 Today's Date: 09/05/2022   History of Present Illness Pt is a 52yo female presenting s/p L-TKA on 09/04/22. PMH: fibromyalgia, GERD, HLD, ,    PT Comments    Pt seen POD1 for first of two sessions, received OOB in recliner. Pt demonstrated modified independence for bed mobility, min guard for transfers, min guard progressed to supervision for ambulation in hallway with RW. Pt completed stair training as she often visits her parents and they have 3stairs with bilateral railings; pt required min guard with verbal cuing; reviewed sequencing via teach back with husband. Provided education, demonstration, and practice with donning and doffing L-KI. Pt is progressing well and will require second session to review HEP.  We will continue to follow acutely.   Recommendations for follow up therapy are one component of a multi-disciplinary discharge planning process, led by the attending physician.  Recommendations may be updated based on patient status, additional functional criteria and insurance authorization.  Follow Up Recommendations  Follow physician's recommendations for discharge plan and follow up therapies     Assistance Recommended at Discharge Frequent or constant Supervision/Assistance  Patient can return home with the following A little help with walking and/or transfers;A little help with bathing/dressing/bathroom;Assistance with cooking/housework;Assist for transportation   Equipment Recommendations  Rolling walker (2 wheels)    Recommendations for Other Services       Precautions / Restrictions Precautions Precautions: Fall Precaution Comments: Hx of recent falls Restrictions Weight Bearing Restrictions: No Other Position/Activity Restrictions: wbat     Mobility  Bed Mobility Overal bed mobility: Modified Independent             General bed mobility comments: Increased  time    Transfers Overall transfer level: Needs assistance Equipment used: Rolling walker (2 wheels) Transfers: Sit to/from Stand Sit to Stand: Min guard           General transfer comment: For safety only VCs for sequencing    Ambulation/Gait Ambulation/Gait assistance: Min guard, Supervision Gait Distance (Feet): 300 Feet Assistive device: Rolling walker (2 wheels) Gait Pattern/deviations: Step-to pattern Gait velocity: decreased     General Gait Details: Pt ambulated with RW and min guard, no physical assist required or overt LOB noted. VCs for sequencing. Three standing rest breaks.   Stairs Stairs: Yes Stairs assistance: Min guard Stair Management: Two rails, Step to pattern, Forwards Number of Stairs: 3 General stair comments: Pt completed stair mobility with min guard, no physical assist required, VCs for sequencing.   Wheelchair Mobility    Modified Rankin (Stroke Patients Only)       Balance Overall balance assessment: Needs assistance, History of Falls Sitting-balance support: Feet supported, No upper extremity supported Sitting balance-Leahy Scale: Good     Standing balance support: Reliant on assistive device for balance, During functional activity, Bilateral upper extremity supported Standing balance-Leahy Scale: Poor                              Cognition Arousal/Alertness: Awake/alert Behavior During Therapy: WFL for tasks assessed/performed Overall Cognitive Status: Within Functional Limits for tasks assessed                                          Exercises Total Joint Exercises Ankle Circles/Pumps: AROM, Both, 20 reps Quad  Sets:  (2) Heel Slides:  (2)    General Comments        Pertinent Vitals/Pain Pain Assessment Pain Assessment: 0-10 Pain Score: 5  Pain Location: left knee Pain Descriptors / Indicators: Operative site guarding Pain Intervention(s): Limited activity within patient's tolerance,  Monitored during session, Repositioned, Ice applied    Home Living                          Prior Function            PT Goals (current goals can now be found in the care plan section) Acute Rehab PT Goals Patient Stated Goal: To walk without pain PT Goal Formulation: With patient Time For Goal Achievement: 09/11/22 Potential to Achieve Goals: Good Progress towards PT goals: Progressing toward goals    Frequency    7X/week      PT Plan Current plan remains appropriate    Co-evaluation              AM-PAC PT "6 Clicks" Mobility   Outcome Measure  Help needed turning from your back to your side while in a flat bed without using bedrails?: None Help needed moving from lying on your back to sitting on the side of a flat bed without using bedrails?: A Little Help needed moving to and from a bed to a chair (including a wheelchair)?: A Little Help needed standing up from a chair using your arms (e.g., wheelchair or bedside chair)?: A Little Help needed to walk in hospital room?: A Little Help needed climbing 3-5 steps with a railing? : A Little 6 Click Score: 19    End of Session Equipment Utilized During Treatment: Gait belt;Left knee immobilizer Activity Tolerance: Patient tolerated treatment well Patient left: with call bell/phone within reach;with family/visitor present;in bed Nurse Communication: Mobility status PT Visit Diagnosis: Pain;Difficulty in walking, not elsewhere classified (R26.2) Pain - Right/Left: Left Pain - part of body: Knee     Time: 5329-9242 PT Time Calculation (min) (ACUTE ONLY): 25 min  Charges:  $Gait Training: 23-37 mins                     Coolidge Breeze, PT, DPT [facility] Office: [phone] Weekend pager: [phone]   Coolidge Breeze 09/05/2022, 11:04 AM

## 2022-09-05 NOTE — Progress Notes (Signed)
   Subjective: 1 Day Post-Op Procedure(s) (LRB): TOTAL KNEE ARTHROPLASTY (Left) Patient reports pain as mild.   Patient seen in rounds by Dr. Wynelle Link. Patient is well, and has had no acute complaints or problems  We will continue therapy today. Ambulated 20' with PT yesterday evening.  Objective: Vital signs in last 24 hours: Temp:  [96.8 F (36 C)-97.9 F (36.6 C)] 97.6 F (36.4 C) (01/30 0534) Pulse Rate:  [64-95] 90 (01/30 0534) Resp:  [10-19] 17 (01/30 0534) BP: (90-146)/(62-96) 110/71 (01/30 0534) SpO2:  [94 %-100 %] 94 % (01/30 0534) Weight:  [63 kg] 63 kg (01/29 1637)  Intake/Output from previous day:  Intake/Output Summary (Last 24 hours) at 09/05/2022 0828 Last data filed at 09/05/2022 0600 Gross per 24 hour  Intake 3733.8 ml  Output 3950 ml  Net -216.2 ml     Intake/Output this shift: No intake/output data recorded.  Labs: Recent Labs    09/05/22 0319  HGB 13.1   Recent Labs    09/05/22 0319  WBC 23.5*  RBC 4.20  HCT 39.5  PLT 266   Recent Labs    09/05/22 0319  NA 137  K 3.8  CL 106  CO2 22  BUN 8  CREATININE 1.00  GLUCOSE 170*  CALCIUM 8.9   No results for input(s): "LABPT", "INR" in the last 72 hours.  Exam: General - Patient is Alert and Oriented Extremity - Neurovascular intact Dressing - dressing C/D/I Motor Function - intact, moving foot and toes well on exam.   Past Medical History:  Diagnosis Date   Anxiety    Bulging lumbar disc    Cervical and thoracic   Family history of adverse reaction to anesthesia    sister PONV   Fibromyalgia    GERD (gastroesophageal reflux disease)    Graves disease    Hyperlipidemia    Hyperthyroidism    Macular degeneration    Migraine    PONV (postoperative nausea and vomiting)    stays awake for 2 days after anesthesia    Assessment/Plan: 1 Day Post-Op Procedure(s) (LRB): TOTAL KNEE ARTHROPLASTY (Left) Principal Problem:   OA (osteoarthritis) of knee Active Problems:   Primary  osteoarthritis of left knee  Estimated body mass index is 23.84 kg/m as calculated from the following:   Height as of this encounter: '5\' 4"'$  (1.626 m).   Weight as of this encounter: 63 kg.   Patient's anticipated LOS is less than 2 midnights, meeting these requirements: - Younger than 82 - Lives within 1 hour of care - Has a competent adult at home to recover with post-op recover - NO history of  - Chronic pain requiring opiods  - Diabetes  - Coronary Artery Disease  - Heart failure  - Heart attack  - Stroke  - DVT/VTE  - Cardiac arrhythmia  - Respiratory Failure/COPD  - Renal failure  - Anemia  - Advanced Liver disease     Up with therapy. WBAT.  DVT Prophylaxis - Aspirin Plan is to go Home with husband and OPPT after hospital stay. Plan for d/c home later today after AM and PM PT sessions if meeting goals.    Shearon Balo, PA-C Orthopedic Surgery 09/05/2022, 8:28 AM

## 2022-09-05 NOTE — Progress Notes (Signed)
Physical Therapy Treatment Patient Details Name: [name] [name] MRN: [id number] DOB: 06-20-1971 Today's Date: 09/05/2022   History of Present Illness Pt is a 52yo female presenting s/p L-TKA on 09/04/22. PMH: fibromyalgia, GERD, HLD, ,    PT Comments    Pt seen POD1 received supine in bed motivated to participate. Demonstrated modified independence for bed mobility, min guard for transfers, and supervision for ambulation in hallway with RW 18f. Provided HEP and pt completed exercises with safe form and minimal cuing. All education completed and pt has no further questions. Pt has met mobility goals for safe discharge home, PT is signing off, should needs change please reconsult. Thank you for this referral.    Recommendations for follow up therapy are one component of a multi-disciplinary discharge planning process, led by the attending physician.  Recommendations may be updated based on patient status, additional functional criteria and insurance authorization.  Follow Up Recommendations  Follow physician's recommendations for discharge plan and follow up therapies     Assistance Recommended at Discharge Frequent or constant Supervision/Assistance  Patient can return home with the following A little help with walking and/or transfers;A little help with bathing/dressing/bathroom;Assistance with cooking/housework;Assist for transportation   Equipment Recommendations  Rolling walker (2 wheels)    Recommendations for Other Services       Precautions / Restrictions Precautions Precautions: Fall Precaution Comments: Hx of recent falls Restrictions Weight Bearing Restrictions: No Other Position/Activity Restrictions: wbat     Mobility  Bed Mobility Overal bed mobility: Modified Independent             General bed mobility comments: Increased time    Transfers Overall transfer level: Needs assistance Equipment used: Rolling walker (2 wheels) Transfers: Sit to/from  Stand Sit to Stand: Min guard           General transfer comment: For safety only VCs for sequencing    Ambulation/Gait Ambulation/Gait assistance: Supervision Gait Distance (Feet): 150 Feet Assistive device: Rolling walker (2 wheels) Gait Pattern/deviations: Step-to pattern Gait velocity: decreased     General Gait Details: Pt ambulated with RW and min guard, no physical assist required or overt LOB noted. VCs for sequencing. Three standing rest breaks.   Stairs Stairs: Yes Stairs assistance: Min guard Stair Management: Two rails, Step to pattern, Forwards Number of Stairs: 3 General stair comments: Pt completed stair mobility with min guard, no physical assist required, VCs for sequencing.   Wheelchair Mobility    Modified Rankin (Stroke Patients Only)       Balance Overall balance assessment: Needs assistance, History of Falls Sitting-balance support: Feet supported, No upper extremity supported Sitting balance-Leahy Scale: Good     Standing balance support: Reliant on assistive device for balance, During functional activity, Bilateral upper extremity supported Standing balance-Leahy Scale: Poor                              Cognition Arousal/Alertness: Awake/alert Behavior During Therapy: WFL for tasks assessed/performed Overall Cognitive Status: Within Functional Limits for tasks assessed                                          Exercises Total Joint Exercises Ankle Circles/Pumps: AROM, Both, 20 reps Quad Sets: AROM, Left, 10 reps, Supine (2) Short Arc Quad: AROM, Left, 10 reps, Supine Heel Slides: AROM, Left, 10 reps, Supine  Hip ABduction/ADduction: AROM, Left, 10 reps, Supine Straight Leg Raises: AROM, Left, 10 reps, Supine Goniometric ROM: -5-50deg by gross visual approximation    General Comments General comments (skin integrity, edema, etc.): Husband Ronalee Belts present      Pertinent Vitals/Pain Pain Assessment Pain  Assessment: 0-10 Pain Score: 6  Pain Location: left knee Pain Descriptors / Indicators: Operative site guarding Pain Intervention(s): Monitored during session, Repositioned, Ice applied    Home Living                          Prior Function            PT Goals (current goals can now be found in the care plan section) Acute Rehab PT Goals Patient Stated Goal: To walk without pain PT Goal Formulation: With patient Time For Goal Achievement: 09/11/22 Potential to Achieve Goals: Good Progress towards PT goals: Goals met/education completed, patient discharged from PT    Frequency    7X/week      PT Plan Current plan remains appropriate    Co-evaluation              AM-PAC PT "6 Clicks" Mobility   Outcome Measure  Help needed turning from your back to your side while in a flat bed without using bedrails?: None Help needed moving from lying on your back to sitting on the side of a flat bed without using bedrails?: A Little Help needed moving to and from a bed to a chair (including a wheelchair)?: A Little Help needed standing up from a chair using your arms (e.g., wheelchair or bedside chair)?: A Little Help needed to walk in hospital room?: A Little Help needed climbing 3-5 steps with a railing? : A Little 6 Click Score: 19    End of Session Equipment Utilized During Treatment: Gait belt Activity Tolerance: Patient tolerated treatment well Patient left: with call bell/phone within reach;with family/visitor present;in bed Nurse Communication: Mobility status PT Visit Diagnosis: Pain;Difficulty in walking, not elsewhere classified (R26.2) Pain - Right/Left: Left Pain - part of body: Knee     Time: 2263-3354 PT Time Calculation (min) (ACUTE ONLY): 27 min  Charges:  $Gait Training: 8-22 mins $Therapeutic Exercise: 8-22 mins                     Coolidge Breeze, PT, DPT [facility] Office: [phone] Weekend pager:  [phone]   Coolidge Breeze 09/05/2022, 2:32 PM

## 2022-09-05 NOTE — TOC Transition Note (Signed)
Transition of Care [facility]) - CM/SW Discharge Note   Patient Details  Name: [name] [name] MRN: [id number] Date of Birth: 1971/05/08  Transition of Care [facility]) CM/SW Contact:  Lennart Pall, LCSW Phone Number: 09/05/2022, 10:02 AM   Clinical Narrative:    Met with pt and confirming receipt of RW to room via [place].  OPPT already arranged with [facility] ([place]).  No further TOC needs.   Final next level of care: OP Rehab Barriers to Discharge: No Barriers Identified   Patient Goals and CMS Choice      Discharge Placement                         Discharge Plan and Services Additional resources added to the After Visit Summary for                  DME Arranged: Walker rolling DME Agency: [employer]       HH Arranged: NA [place] Agency: NA        Social Determinants of Health (SDOH) Interventions SDOH Screenings   Food Insecurity: No Food Insecurity (09/04/2022)  Housing: Low Risk  (09/04/2022)  Transportation Needs: No Transportation Needs (09/04/2022)  Utilities: Not At Risk (09/04/2022)  Tobacco Use: High Risk (09/05/2022)     Readmission Risk Interventions     No data to display

## 2022-09-06 DIAGNOSIS — R269 Unspecified abnormalities of gait and mobility: Secondary | ICD-10-CM | POA: Diagnosis not present

## 2022-09-06 DIAGNOSIS — M25662 Stiffness of left knee, not elsewhere classified: Secondary | ICD-10-CM | POA: Diagnosis not present

## 2022-09-06 DIAGNOSIS — M25562 Pain in left knee: Secondary | ICD-10-CM | POA: Diagnosis not present

## 2022-09-08 DIAGNOSIS — R269 Unspecified abnormalities of gait and mobility: Secondary | ICD-10-CM | POA: Diagnosis not present

## 2022-09-08 DIAGNOSIS — M25562 Pain in left knee: Secondary | ICD-10-CM | POA: Diagnosis not present

## 2022-09-08 DIAGNOSIS — M25662 Stiffness of left knee, not elsewhere classified: Secondary | ICD-10-CM | POA: Diagnosis not present

## 2022-09-11 DIAGNOSIS — M25562 Pain in left knee: Secondary | ICD-10-CM | POA: Diagnosis not present

## 2022-09-11 DIAGNOSIS — R269 Unspecified abnormalities of gait and mobility: Secondary | ICD-10-CM | POA: Diagnosis not present

## 2022-09-11 DIAGNOSIS — M25662 Stiffness of left knee, not elsewhere classified: Secondary | ICD-10-CM | POA: Diagnosis not present

## 2022-09-13 DIAGNOSIS — M25662 Stiffness of left knee, not elsewhere classified: Secondary | ICD-10-CM | POA: Diagnosis not present

## 2022-09-13 DIAGNOSIS — R269 Unspecified abnormalities of gait and mobility: Secondary | ICD-10-CM | POA: Diagnosis not present

## 2022-09-13 DIAGNOSIS — M25562 Pain in left knee: Secondary | ICD-10-CM | POA: Diagnosis not present

## 2022-09-13 NOTE — Discharge Summary (Signed)
Physician Discharge Summary   Patient ID: [name] [name] MRN: [id number] DOB/AGE: Aug 14, 1970 52 y.o.  Admit date: 09/04/2022 Discharge date: 09/05/2022  Primary Diagnosis: Left knee osteoarthritis   Admission Diagnoses:  Past Medical History:  Diagnosis Date   Anxiety    Bulging lumbar disc    Cervical and thoracic   Family history of adverse reaction to anesthesia    sister PONV   Fibromyalgia    GERD (gastroesophageal reflux disease)    Graves disease    Hyperlipidemia    Hyperthyroidism    Macular degeneration    Migraine    PONV (postoperative nausea and vomiting)    stays awake for 2 days after anesthesia   Discharge Diagnoses:   Principal Problem:   OA (osteoarthritis) of knee Active Problems:   Primary osteoarthritis of left knee  Estimated body mass index is 23.84 kg/m as calculated from the following:   Height as of this encounter: '5\' 4"'$  (1.626 m).   Weight as of this encounter: 63 kg.  Procedure:  Procedure(s) (LRB): TOTAL KNEE ARTHROPLASTY (Left)   Consults: None  HPI: [name] [name] is a 52 y.o. year old female with end stage OA of her left knee with progressively worsening pain and dysfunction. She has constant pain, with activity and at rest and significant functional deficits with difficulties even with ADLs. She has had extensive non-op management including analgesics, injections of cortisone and viscosupplements, and home exercise program, but remains in significant pain with significant dysfunction. Radiographs show bone on bone arthritis medial and patellofemoral. She presents now for left Total Knee Arthroplasty.      Laboratory Data: Admission on 09/04/2022, Discharged on 09/05/2022  Component Date Value Ref Range Status   WBC 09/05/2022 23.5 (H)  4.0 - 10.5 K/uL Final   RBC 09/05/2022 4.20  3.87 - 5.11 MIL/uL Final   Hemoglobin 09/05/2022 13.1  12.0 - 15.0 g/dL Final   HCT 09/05/2022 39.5  36.0 - 46.0 % Final   MCV 09/05/2022 94.0  80.0 -  100.0 fL Final   MCH 09/05/2022 31.2  26.0 - 34.0 pg Final   MCHC 09/05/2022 33.2  30.0 - 36.0 g/dL Final   RDW 09/05/2022 13.2  11.5 - 15.5 % Final   Platelets 09/05/2022 266  150 - 400 K/uL Final   nRBC 09/05/2022 0.0  0.0 - 0.2 % Final   Performed at [facility], [place] [address] [place], [place] [address]   Sodium 09/05/2022 137  135 - 145 mmol/L Final   Potassium 09/05/2022 3.8  3.5 - 5.1 mmol/L Final   Chloride 09/05/2022 106  98 - 111 mmol/L Final   CO2 09/05/2022 22  22 - 32 mmol/L Final   Glucose, Bld 09/05/2022 170 (H)  70 - 99 mg/dL Final   Glucose reference range applies only to samples taken after fasting for at least 8 hours.   BUN 09/05/2022 8  6 - 20 mg/dL Final   Creatinine, Ser 09/05/2022 1.00  0.44 - 1.00 mg/dL Final   Calcium 09/05/2022 8.9  8.9 - 10.3 mg/dL Final   GFR, Estimated 09/05/2022 >60  >60 mL/min Final   Comment: (NOTE) Calculated using the CKD-EPI Creatinine Equation (2021)    Anion gap 09/05/2022 9  5 - 15 Final   Performed at [facility], [place] [address], [place], [place] [address]  Hospital Outpatient Visit on 08/22/2022  Component Date Value Ref Range Status   MRSA, PCR 08/22/2022 NEGATIVE  NEGATIVE Final  Staphylococcus aureus 08/22/2022 NEGATIVE  NEGATIVE Final   Comment: (NOTE) The Xpert SA Assay (FDA approved for NASAL specimens in patients 47 years of age and older), is one component of a comprehensive surveillance program. It is not intended to diagnose infection nor to guide or monitor treatment. Performed at [facility], [place] [address], [place], [place] [address]    Sodium 08/22/2022 136  135 - 145 mmol/L Final   Potassium 08/22/2022 4.1  3.5 - 5.1 mmol/L Final   Chloride 08/22/2022 103  98 - 111 mmol/L Final   CO2 08/22/2022 25  22 - 32 mmol/L Final   Glucose, Bld 08/22/2022 88  70 - 99 mg/dL Final   Glucose reference range applies only to samples taken after fasting for at  least 8 hours.   BUN 08/22/2022 7  6 - 20 mg/dL Final   Creatinine, Ser 08/22/2022 0.92  0.44 - 1.00 mg/dL Final   Calcium 08/22/2022 9.6  8.9 - 10.3 mg/dL Final   GFR, Estimated 08/22/2022 >60  >60 mL/min Final   Comment: (NOTE) Calculated using the CKD-EPI Creatinine Equation (2021)    Anion gap 08/22/2022 8  5 - 15 Final   Performed at [facility], [place] [address] [place], [place] [address]   WBC 08/22/2022 10.4  4.0 - 10.5 K/uL Final   RBC 08/22/2022 4.83  3.87 - 5.11 MIL/uL Final   Hemoglobin 08/22/2022 15.0  12.0 - 15.0 g/dL Final   HCT 08/22/2022 45.3  36.0 - 46.0 % Final   MCV 08/22/2022 93.8  80.0 - 100.0 fL Final   MCH 08/22/2022 31.1  26.0 - 34.0 pg Final   MCHC 08/22/2022 33.1  30.0 - 36.0 g/dL Final   RDW 08/22/2022 13.5  11.5 - 15.5 % Final   Platelets 08/22/2022 383  150 - 400 K/uL Final   nRBC 08/22/2022 0.0  0.0 - 0.2 % Final   Performed at [facility], [place] [address] [place], [place] [address]     X-Rays:No results found.  EKG: Orders placed or performed during the hospital encounter of 08/22/22   EKG 12 lead per protocol   EKG 12 lead per protocol     Hospital Course: [name] [name] is a 52 y.o. who was admitted to [facility]. They were brought to the operating room on 09/04/2022 and underwent Procedure(s): TOTAL KNEE ARTHROPLASTY.  Patient tolerated the procedure well and was later transferred to the recovery room and then to the orthopaedic floor for postoperative care. They were given PO and IV analgesics for pain control following their surgery. They were given 24 hours of postoperative antibiotics of  Anti-infectives (From admission, onward)    Start     Dose/Rate Route Frequency Ordered Stop   09/04/22 1800  ceFAZolin (ANCEF) IVPB 2g/100 mL premix        2 g 200 mL/hr over 30 Minutes Intravenous Every 6 hours 09/04/22 1604 09/05/22 0018   09/04/22 1000  ceFAZolin (ANCEF) IVPB 2g/100 mL premix        2  g 200 mL/hr over 30 Minutes Intravenous On call to O.R. 09/04/22 9509 09/04/22 1219      and started on DVT prophylaxis in the form of Aspirin.   PT and OT were ordered for total joint protocol. Discharge planning consulted to help with postop disposition and equipment needs.  Patient had an uneventful night on the evening of surgery. They started to get up OOB with therapy on  POD 0. Pt was seen during rounds and was ready to go home pending progress with therapy. She worked with therapy on POD #1 and was meeting her goals. Pt was discharged to home later that day in stable condition.  Diet: Regular diet Activity: WBAT Follow-up: in 2 weeks Disposition: Home Discharged Condition: good   Discharge Instructions     Call MD / Call 911   Complete by: As directed    If you experience chest pain or shortness of breath, CALL 911 and be transported to the hospital emergency room.  If you develope a fever above 101 F, pus (white drainage) or increased drainage or redness at the wound, or calf pain, call your surgeon's office.   Change dressing   Complete by: As directed    You may remove the bulky bandage (ACE wrap and gauze) two days after surgery. You will have an adhesive waterproof bandage underneath. Leave this in place until your first follow-up appointment.   Constipation Prevention   Complete by: As directed    Drink plenty of fluids.  Prune juice may be helpful.  You may use a stool softener, such as Colace (over the counter) 100 mg twice a day.  Use MiraLax (over the counter) for constipation as needed.   Diet - low sodium heart healthy   Complete by: As directed    Do not put a pillow under the knee. Place it under the heel.   Complete by: As directed    Driving restrictions   Complete by: As directed    No driving for two weeks   Post-operative opioid taper instructions:   Complete by: As directed    POST-OPERATIVE OPIOID TAPER INSTRUCTIONS: It is important to wean off of your  opioid medication as soon as possible. If you do not need pain medication after your surgery it is ok to stop day one. Opioids include: Codeine, Hydrocodone(Norco, Vicodin), Oxycodone(Percocet, oxycontin) and hydromorphone amongst others.  Long term and even short term use of opiods can cause: Increased pain response Dependence Constipation Depression Respiratory depression And more.  Withdrawal symptoms can include Flu like symptoms Nausea, vomiting And more Techniques to manage these symptoms Hydrate well Eat regular healthy meals Stay active Use relaxation techniques(deep breathing, meditating, yoga) Do Not substitute Alcohol to help with tapering If you have been on opioids for less than two weeks and do not have pain than it is ok to stop all together.  Plan to wean off of opioids This plan should start within one week post op of your joint replacement. Maintain the same interval or time between taking each dose and first decrease the dose.  Cut the total daily intake of opioids by one tablet each day Next start to increase the time between doses. The last dose that should be eliminated is the evening dose.      TED hose   Complete by: As directed    Use stockings (TED hose) for three weeks on both leg(s).  You may remove them at night for sleeping.   Weight bearing as tolerated   Complete by: As directed       Allergies as of 09/05/2022       Reactions   Prednisone    In pill form causes "extreme palpitations" but patient can take injection form   Tape Other (See Comments)   Blistering skin        Medication List     STOP taking these medications    HYDROcodone-acetaminophen  5-325 MG tablet Commonly known as: NORCO/VICODIN       TAKE these medications    acetaminophen 500 MG tablet Commonly known as: TYLENOL Take 500-1,000 mg by mouth every 6 (six) hours as needed (pain.).   Aimovig 140 MG/ML Soaj Generic drug: Erenumab-aooe Inject 140 mg into  the skin every 30 (thirty) days.   ALPRAZolam 0.5 MG tablet Commonly known as: XANAX Take 0.5 mg by mouth 2 (two) times daily as needed for anxiety.   amitriptyline 50 MG tablet Commonly known as: ELAVIL Take 50 mg by mouth at bedtime.   aspirin EC 325 MG tablet Take 1 tablet (325 mg total) by mouth 2 (two) times daily for 20 days.   ergocalciferol 1.25 MG (50000 UT) capsule Commonly known as: VITAMIN D2 Take 50,000 Units by mouth every Friday.   famotidine 20 MG tablet Commonly known as: PEPCID Take 20 mg by mouth 2 (two) times daily.   fluticasone 50 MCG/ACT nasal spray Commonly known as: FLONASE Place 2 sprays into both nostrils daily as needed (allergies/congestion.).   gabapentin 300 MG capsule Commonly known as: NEURONTIN Take a 300 mg capsule three times a day for two weeks following surgery.Then take a 300 mg capsule two times a day for two weeks. Then take a 300 mg capsule once a day for two weeks. Then discontinue.   gemfibrozil 600 MG tablet Commonly known as: LOPID Take 600 mg by mouth 2 (two) times daily.   levothyroxine 100 MCG tablet Commonly known as: SYNTHROID Take 100 mcg by mouth every other day. Alternating between 112 mcg and 100 mcg every other morning.   levothyroxine 112 MCG tablet Commonly known as: SYNTHROID Take 112 mcg by mouth every other day. Alternating between 100 mcg and 112 mcg every other morning.   methocarbamol 500 MG tablet Commonly known as: ROBAXIN Take 1 tablet (500 mg total) by mouth every 6 (six) hours as needed for muscle spasms.   OMEGA-3 FISH OIL PO Take 6,300 mg by mouth every evening. 3 capsules   ondansetron 4 MG disintegrating tablet Commonly known as: ZOFRAN-ODT Take 1 tablet (4 mg total) by mouth every 8 (eight) hours as needed for nausea or vomiting. Max 30 per month.   oxyCODONE 5 MG immediate release tablet Commonly known as: Oxy IR/ROXICODONE Take 1-2 tablets (5-10 mg total) by mouth every 6 (six) hours as  needed for severe pain.   propranolol ER 160 MG SR capsule Commonly known as: INDERAL LA Take 160 mg by mouth at bedtime.   rizatriptan 10 MG disintegrating tablet Commonly known as: MAXALT-MLT Take 1 tablet (10 mg total) by mouth as needed for up to 1 dose for migraine. May repeat in 2 hours if needed. Max 2 tabs in 24 hours.   traMADol 50 MG tablet Commonly known as: ULTRAM Take 1-2 tablets (50-100 mg total) by mouth every 6 (six) hours as needed for moderate pain.   Vitamin D3 125 MCG (5000 UT) Tabs Take 5,000 Units by mouth See admin instructions. Take 1 tablet (5000 units) by mouth in the morning after breakfast on 'Sundays, Mondays, Tuesdays, Wednesdays, Thursdays & Saturdays.               Discharge Care Instructions  (From admission, onward)           Start     Ordered   09/05/22 0000  Weight bearing as tolerated        01'$ /30/24 1306   09/05/22 0000  Change dressing  Comments: You may remove the bulky bandage (ACE wrap and gauze) two days after surgery. You will have an adhesive waterproof bandage underneath. Leave this in place until your first follow-up appointment.   09/05/22 1306            Follow-up Information     Gaynelle Arabian, MD. Go on 09/20/2022.   Specialty: Orthopedic Surgery Why: You are currently scheduled for a follow up appointment on 09-20-22 at 2:00 pm. Contact information: [address] [place] [address] [phone]                 Signed: Shearon Balo, PA-C Orthopedic Surgery 09/13/2022, 11:21 AM

## 2022-09-15 DIAGNOSIS — R269 Unspecified abnormalities of gait and mobility: Secondary | ICD-10-CM | POA: Diagnosis not present

## 2022-09-15 DIAGNOSIS — M25562 Pain in left knee: Secondary | ICD-10-CM | POA: Diagnosis not present

## 2022-09-15 DIAGNOSIS — M25662 Stiffness of left knee, not elsewhere classified: Secondary | ICD-10-CM | POA: Diagnosis not present

## 2022-09-18 DIAGNOSIS — M25562 Pain in left knee: Secondary | ICD-10-CM | POA: Diagnosis not present

## 2022-09-18 DIAGNOSIS — R269 Unspecified abnormalities of gait and mobility: Secondary | ICD-10-CM | POA: Diagnosis not present

## 2022-09-18 DIAGNOSIS — M25662 Stiffness of left knee, not elsewhere classified: Secondary | ICD-10-CM | POA: Diagnosis not present

## 2022-09-20 DIAGNOSIS — M25562 Pain in left knee: Secondary | ICD-10-CM | POA: Diagnosis not present

## 2022-09-20 DIAGNOSIS — M25662 Stiffness of left knee, not elsewhere classified: Secondary | ICD-10-CM | POA: Diagnosis not present

## 2022-09-20 DIAGNOSIS — R269 Unspecified abnormalities of gait and mobility: Secondary | ICD-10-CM | POA: Diagnosis not present

## 2022-09-22 DIAGNOSIS — R269 Unspecified abnormalities of gait and mobility: Secondary | ICD-10-CM | POA: Diagnosis not present

## 2022-09-22 DIAGNOSIS — M25662 Stiffness of left knee, not elsewhere classified: Secondary | ICD-10-CM | POA: Diagnosis not present

## 2022-09-22 DIAGNOSIS — M25562 Pain in left knee: Secondary | ICD-10-CM | POA: Diagnosis not present

## 2022-09-25 DIAGNOSIS — M25562 Pain in left knee: Secondary | ICD-10-CM | POA: Diagnosis not present

## 2022-09-25 DIAGNOSIS — M25662 Stiffness of left knee, not elsewhere classified: Secondary | ICD-10-CM | POA: Diagnosis not present

## 2022-09-25 DIAGNOSIS — R269 Unspecified abnormalities of gait and mobility: Secondary | ICD-10-CM | POA: Diagnosis not present

## 2022-09-27 DIAGNOSIS — R269 Unspecified abnormalities of gait and mobility: Secondary | ICD-10-CM | POA: Diagnosis not present

## 2022-09-27 DIAGNOSIS — M25662 Stiffness of left knee, not elsewhere classified: Secondary | ICD-10-CM | POA: Diagnosis not present

## 2022-09-27 DIAGNOSIS — M25562 Pain in left knee: Secondary | ICD-10-CM | POA: Diagnosis not present

## 2022-09-29 DIAGNOSIS — R269 Unspecified abnormalities of gait and mobility: Secondary | ICD-10-CM | POA: Diagnosis not present

## 2022-09-29 DIAGNOSIS — M25562 Pain in left knee: Secondary | ICD-10-CM | POA: Diagnosis not present

## 2022-09-29 DIAGNOSIS — M25662 Stiffness of left knee, not elsewhere classified: Secondary | ICD-10-CM | POA: Diagnosis not present

## 2022-10-02 DIAGNOSIS — R269 Unspecified abnormalities of gait and mobility: Secondary | ICD-10-CM | POA: Diagnosis not present

## 2022-10-02 DIAGNOSIS — M25662 Stiffness of left knee, not elsewhere classified: Secondary | ICD-10-CM | POA: Diagnosis not present

## 2022-10-02 DIAGNOSIS — M25562 Pain in left knee: Secondary | ICD-10-CM | POA: Diagnosis not present

## 2022-10-04 DIAGNOSIS — R269 Unspecified abnormalities of gait and mobility: Secondary | ICD-10-CM | POA: Diagnosis not present

## 2022-10-04 DIAGNOSIS — M25562 Pain in left knee: Secondary | ICD-10-CM | POA: Diagnosis not present

## 2022-10-04 DIAGNOSIS — M25662 Stiffness of left knee, not elsewhere classified: Secondary | ICD-10-CM | POA: Diagnosis not present

## 2022-10-06 DIAGNOSIS — R269 Unspecified abnormalities of gait and mobility: Secondary | ICD-10-CM | POA: Diagnosis not present

## 2022-10-06 DIAGNOSIS — M25562 Pain in left knee: Secondary | ICD-10-CM | POA: Diagnosis not present

## 2022-10-06 DIAGNOSIS — M25662 Stiffness of left knee, not elsewhere classified: Secondary | ICD-10-CM | POA: Diagnosis not present

## 2022-10-10 DIAGNOSIS — M25662 Stiffness of left knee, not elsewhere classified: Secondary | ICD-10-CM | POA: Diagnosis not present

## 2022-10-10 DIAGNOSIS — R269 Unspecified abnormalities of gait and mobility: Secondary | ICD-10-CM | POA: Diagnosis not present

## 2022-10-10 DIAGNOSIS — M25562 Pain in left knee: Secondary | ICD-10-CM | POA: Diagnosis not present

## 2022-10-12 ENCOUNTER — Other Ambulatory Visit: Payer: Self-pay

## 2022-10-12 DIAGNOSIS — G43711 Chronic migraine without aura, intractable, with status migrainosus: Secondary | ICD-10-CM

## 2022-10-12 DIAGNOSIS — M25662 Stiffness of left knee, not elsewhere classified: Secondary | ICD-10-CM | POA: Diagnosis not present

## 2022-10-12 DIAGNOSIS — M25562 Pain in left knee: Secondary | ICD-10-CM | POA: Diagnosis not present

## 2022-10-12 DIAGNOSIS — R269 Unspecified abnormalities of gait and mobility: Secondary | ICD-10-CM | POA: Diagnosis not present

## 2022-10-12 MED ORDER — AIMOVIG 140 MG/ML ~~LOC~~ SOAJ: 140.0000 mg | SUBCUTANEOUS | 0 refills | Status: DC

## 2022-10-12 NOTE — Progress Notes (Signed)
PATIENT: [name] [name] DOB: [birth year]  REASON FOR VISIT: follow up HISTORY FROM: patient  Virtual Visit via Telephone Note  I connected with [name] [name] on 10/17/22 at  9:45 AM EDT by telephone and verified that I am speaking with the correct person using two identifiers.   I discussed the limitations, risks, security and privacy concerns of performing an evaluation and management service by telephone and the availability of in person appointments. I also discussed with the patient that there may be a patient responsible charge related to this service. The patient expressed understanding and agreed to proceed.   History of Present Illness:  10/17/22 ALL ([place]): [name] [name] is a 52 y.o. female here today for follow up for migraines. She continues Amovig monthly and rizatriptan and ondansetron as needed. She reports doing well. She may have 2 migraines per month, on average. Rizatriptan works well. She uses ondansetron for nausea as needed. She is doing well.   07/07/21 ALL: [name] [name] returns for follow up for migraines. She has been doing very well on Amovig. She was receiving through PAP but recently denied. She continues propranolol for pulse management with Graves. She was started on amitriptyline for fibromyalgia in 11/2020. She is tolerating it well and feels it works well for joint pain. She has not had very many headaches at all. Maybe 1 per month. Rizatriptan helps with abortive therapy. She has 4 Amovig injections left and wondering if she needs to switch to a different CGRP.    07/07/2020 ALL:  [name] [name] is a 52 y.o. female here today for follow up for migraines. She continues Amovig monthly and ondansetron, rizatriptan as needed for abortive therapy. She is getting Amovig through PAP. 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 No migraines over the past three months. She is also taking propranolol LA '160mg'$  daily for tachycardia  associated with Graves. She is feeling well, today and without complaints.     HISTORY (copied from previous note)   Interval history 07/07/2019:  Doing exceptionally well. Just has a migraine after the injection but pretreating with maxalt helps. She gets a migraine the next day. We discussed continuing [place], she is doing so well. We discussed acute treatment also pretreating.    Interval history 07/03/2018: She is here for follow up on chronic intractable migraines. Started on Aimovig and doing extremely well. Baseline was  daily headaches and 15 days a month or migrainous. She returns today after 4 months with excellent progress. She has failed multiple medications for years. The aimovig worked quickly. She only has a migraine the day she has an injection. Recommend taking maxalt with the injection. She has NOT HAD ONE MIGRAINE since starting. She has had several mild headaches. Discussed pre-treatment.    HPI:  TYMBER [name] is a 52 y.o. female here as a new referral from Dr. Quintin Alto for migraines.  Patient has had migraines for many years.  Migraines are unilateral, pulsating pounding throbbing, associated light and sound sensitivity, nausea and vomiting, movement makes it worse.  She is tried and failed multiple medications.  She has daily headaches and 15 days a month or migrainous and can last up to 24 hours and most are moderately severe or severe migraines, sitting in a dark room with no sound and no movement helps, stress and whether or triggers.  No aura.  No medication overuse.  She does have neck pain.  No other focal neurologic deficits,  associated symptoms, inciting events or modifiable factors.   Medications tried: Maxalt, propranolol, Imitrex, Zofran, Flexeril, gabapentin, naproxen, Phenergan, tramadol, topiramate, nortriptyline and amitriptyline   Reviewed notes, labs and imaging from outside physicians, which showed:   Reviewed MRI of the brain that she brought on disc MRI of  the head without contrast media which showed T2 white matter changes likely incidental chronic microvascular changes which could be from migraine.  Per report her right millimeter pineal region cyst is stable when compared to January 2018 I do not have the 2018 MRI to compare against.   Personally reviewed images MRI cervical spine and agree with the following: C6-C7 disc bulging and facet hypertrophy with moderate right and mild left foraminal stenosis, C5-C6 disc bulging with mild left foraminal stenosis, no intrinsic or compressive spinal cord lesions overall mild normal for age degenerative changes per my review.   Observations/Objective:  Generalized: Well developed, in no acute distress  Mentation: Alert oriented to time, place, history taking. Follows all commands speech and language fluent   Assessment and Plan:  52 y.o. year old female  has a past medical history of Anxiety, Bulging lumbar disc, Family history of adverse reaction to anesthesia, Fibromyalgia, GERD (gastroesophageal reflux disease), Graves disease, Hyperlipidemia, Hyperthyroidism, Macular degeneration, Migraine, and PONV (postoperative nausea and vomiting). here with    ICD-10-CM   1. Chronic migraine without aura, with intractable migraine, so stated, with status migrainosus  G43.711 Erenumab-aooe (AIMOVIG) 140 MG/ML SOAJ      Joshalyn continues to do well. Migraines are well managed on Amovig, rizatriptan and ondansetron. We will continue current treatment plan. She will continue healthy lifestyle habits. Follow up with me in 1 year.   No orders of the defined types were placed in this encounter.   Meds ordered this encounter  Medications   rizatriptan (MAXALT-MLT) 10 MG disintegrating tablet    Sig: Take 1 tablet (10 mg total) by mouth as needed for up to 1 dose for migraine. May repeat in 2 hours if needed. Max 2 tabs in 24 hours.    Dispense:  9 tablet    Refill:  11    3 month supply    Order Specific  Question:   Supervising Provider    Answer:   Melvenia Beam [id number]   ondansetron (ZOFRAN-ODT) 4 MG disintegrating tablet    Sig: Take 1 tablet (4 mg total) by mouth every 8 (eight) hours as needed for nausea or vomiting. Max 30 per month.    Dispense:  30 tablet    Refill:  1    Order Specific Question:   Supervising Provider    Answer:   Melvenia Beam [id number]   Erenumab-aooe (AIMOVIG) 140 MG/ML SOAJ    Sig: Inject 140 mg into the skin every 30 (thirty) days.    Dispense:  3 mL    Refill:  3    3 month supply    Order Specific Question:   Supervising Provider    Answer:   Melvenia Beam [id number]     Follow Up Instructions:  I discussed the assessment and treatment plan with the patient. The patient was provided an opportunity to ask questions and all were answered. The patient agreed with the plan and demonstrated an understanding of the instructions.   The patient was advised to call back or seek an in-person evaluation if the symptoms worsen or if the condition fails to improve as anticipated.  I provided 15 minutes of non-face-to-face time during  this encounter. Patient located at their place of residence during [place] visit. Provider is in the office.    Debbora Presto, NP

## 2022-10-12 NOTE — Patient Instructions (Signed)
Below is our plan:  We will continue Amovig, rizatriptan and ondansetron as prescribed.   Please make sure you are staying well hydrated. I recommend 50-60 ounces daily. Well balanced diet and regular exercise encouraged. Consistent sleep schedule with 6-8 hours recommended.   Please continue follow up with care team as directed.   Follow up with me in 1 year   You may receive a survey regarding today's visit. I encourage you to leave honest feed back as I do use this information to improve patient care. Thank you for seeing me today!

## 2022-10-17 ENCOUNTER — Telehealth (INDEPENDENT_AMBULATORY_CARE_PROVIDER_SITE_OTHER): Payer: Medicare Other | Admitting: Family Medicine

## 2022-10-17 ENCOUNTER — Encounter: Payer: Self-pay | Admitting: Family Medicine

## 2022-10-17 DIAGNOSIS — G43711 Chronic migraine without aura, intractable, with status migrainosus: Secondary | ICD-10-CM

## 2022-10-17 DIAGNOSIS — R269 Unspecified abnormalities of gait and mobility: Secondary | ICD-10-CM | POA: Diagnosis not present

## 2022-10-17 DIAGNOSIS — M25562 Pain in left knee: Secondary | ICD-10-CM | POA: Diagnosis not present

## 2022-10-17 DIAGNOSIS — M25662 Stiffness of left knee, not elsewhere classified: Secondary | ICD-10-CM | POA: Diagnosis not present

## 2022-10-17 MED ORDER — AIMOVIG 140 MG/ML ~~LOC~~ SOAJ: 140.0000 mg | SUBCUTANEOUS | 3 refills | Status: DC

## 2022-10-17 MED ORDER — ONDANSETRON 4 MG PO TBDP: 4.0000 mg | ORAL_TABLET | Freq: Three times a day (TID) | ORAL | 1 refills | Status: DC | PRN

## 2022-10-17 MED ORDER — RIZATRIPTAN BENZOATE 10 MG PO TBDP: 10.0000 mg | ORAL_TABLET | ORAL | 11 refills | Status: DC | PRN

## 2022-10-18 DIAGNOSIS — Z5189 Encounter for other specified aftercare: Secondary | ICD-10-CM | POA: Diagnosis not present

## 2022-11-06 DIAGNOSIS — Z5189 Encounter for other specified aftercare: Secondary | ICD-10-CM | POA: Diagnosis not present

## 2022-11-16 DIAGNOSIS — F17210 Nicotine dependence, cigarettes, uncomplicated: Secondary | ICD-10-CM | POA: Diagnosis not present

## 2022-11-16 DIAGNOSIS — R52 Pain, unspecified: Secondary | ICD-10-CM | POA: Diagnosis not present

## 2022-11-16 DIAGNOSIS — Z6823 Body mass index (BMI) 23.0-23.9, adult: Secondary | ICD-10-CM | POA: Diagnosis not present

## 2022-11-16 DIAGNOSIS — R030 Elevated blood-pressure reading, without diagnosis of hypertension: Secondary | ICD-10-CM | POA: Diagnosis not present

## 2022-11-16 DIAGNOSIS — R5383 Other fatigue: Secondary | ICD-10-CM | POA: Diagnosis not present

## 2023-01-05 DIAGNOSIS — E782 Mixed hyperlipidemia: Secondary | ICD-10-CM | POA: Diagnosis not present

## 2023-01-05 DIAGNOSIS — E1165 Type 2 diabetes mellitus with hyperglycemia: Secondary | ICD-10-CM | POA: Diagnosis not present

## 2023-01-05 DIAGNOSIS — E039 Hypothyroidism, unspecified: Secondary | ICD-10-CM | POA: Diagnosis not present

## 2023-01-15 DIAGNOSIS — E7849 Other hyperlipidemia: Secondary | ICD-10-CM | POA: Diagnosis not present

## 2023-01-15 DIAGNOSIS — E559 Vitamin D deficiency, unspecified: Secondary | ICD-10-CM | POA: Diagnosis not present

## 2023-01-15 DIAGNOSIS — E781 Pure hyperglyceridemia: Secondary | ICD-10-CM | POA: Diagnosis not present

## 2023-01-15 DIAGNOSIS — E876 Hypokalemia: Secondary | ICD-10-CM | POA: Diagnosis not present

## 2023-01-15 DIAGNOSIS — E039 Hypothyroidism, unspecified: Secondary | ICD-10-CM | POA: Diagnosis not present

## 2023-01-24 DIAGNOSIS — R079 Chest pain, unspecified: Secondary | ICD-10-CM | POA: Diagnosis not present

## 2023-01-24 DIAGNOSIS — T466X5A Adverse effect of antihyperlipidemic and antiarteriosclerotic drugs, initial encounter: Secondary | ICD-10-CM | POA: Diagnosis not present

## 2023-01-24 DIAGNOSIS — M75101 Unspecified rotator cuff tear or rupture of right shoulder, not specified as traumatic: Secondary | ICD-10-CM | POA: Diagnosis not present

## 2023-01-24 DIAGNOSIS — E7849 Other hyperlipidemia: Secondary | ICD-10-CM | POA: Diagnosis not present

## 2023-01-24 DIAGNOSIS — G43909 Migraine, unspecified, not intractable, without status migrainosus: Secondary | ICD-10-CM | POA: Diagnosis not present

## 2023-01-24 DIAGNOSIS — M791 Myalgia, unspecified site: Secondary | ICD-10-CM | POA: Diagnosis not present

## 2023-01-24 DIAGNOSIS — R002 Palpitations: Secondary | ICD-10-CM | POA: Diagnosis not present

## 2023-01-24 DIAGNOSIS — E559 Vitamin D deficiency, unspecified: Secondary | ICD-10-CM | POA: Diagnosis not present

## 2023-01-24 DIAGNOSIS — E876 Hypokalemia: Secondary | ICD-10-CM | POA: Diagnosis not present

## 2023-01-24 DIAGNOSIS — F17210 Nicotine dependence, cigarettes, uncomplicated: Secondary | ICD-10-CM | POA: Diagnosis not present

## 2023-01-24 DIAGNOSIS — M542 Cervicalgia: Secondary | ICD-10-CM | POA: Diagnosis not present

## 2023-01-24 DIAGNOSIS — N1831 Chronic kidney disease, stage 3a: Secondary | ICD-10-CM | POA: Diagnosis not present

## 2023-01-26 DIAGNOSIS — J209 Acute bronchitis, unspecified: Secondary | ICD-10-CM | POA: Diagnosis not present

## 2023-01-26 DIAGNOSIS — F17210 Nicotine dependence, cigarettes, uncomplicated: Secondary | ICD-10-CM | POA: Diagnosis not present

## 2023-01-26 DIAGNOSIS — J0100 Acute maxillary sinusitis, unspecified: Secondary | ICD-10-CM | POA: Diagnosis not present

## 2023-02-02 DIAGNOSIS — R059 Cough, unspecified: Secondary | ICD-10-CM | POA: Diagnosis not present

## 2023-02-02 DIAGNOSIS — J40 Bronchitis, not specified as acute or chronic: Secondary | ICD-10-CM | POA: Diagnosis not present

## 2023-02-02 DIAGNOSIS — Z6823 Body mass index (BMI) 23.0-23.9, adult: Secondary | ICD-10-CM | POA: Diagnosis not present

## 2023-02-02 DIAGNOSIS — R030 Elevated blood-pressure reading, without diagnosis of hypertension: Secondary | ICD-10-CM | POA: Diagnosis not present

## 2023-02-07 DIAGNOSIS — Z6822 Body mass index (BMI) 22.0-22.9, adult: Secondary | ICD-10-CM | POA: Diagnosis not present

## 2023-02-07 DIAGNOSIS — F17210 Nicotine dependence, cigarettes, uncomplicated: Secondary | ICD-10-CM | POA: Diagnosis not present

## 2023-02-07 DIAGNOSIS — J40 Bronchitis, not specified as acute or chronic: Secondary | ICD-10-CM | POA: Diagnosis not present

## 2023-02-07 DIAGNOSIS — H6691 Otitis media, unspecified, right ear: Secondary | ICD-10-CM | POA: Diagnosis not present

## 2023-02-07 DIAGNOSIS — R030 Elevated blood-pressure reading, without diagnosis of hypertension: Secondary | ICD-10-CM | POA: Diagnosis not present

## 2023-02-07 DIAGNOSIS — R062 Wheezing: Secondary | ICD-10-CM | POA: Diagnosis not present

## 2023-02-22 DIAGNOSIS — Z1231 Encounter for screening mammogram for malignant neoplasm of breast: Secondary | ICD-10-CM | POA: Diagnosis not present

## 2023-03-24 ENCOUNTER — Encounter: Payer: Self-pay | Admitting: Family Medicine

## 2023-03-26 DIAGNOSIS — M75102 Unspecified rotator cuff tear or rupture of left shoulder, not specified as traumatic: Secondary | ICD-10-CM | POA: Diagnosis not present

## 2023-05-16 DIAGNOSIS — E559 Vitamin D deficiency, unspecified: Secondary | ICD-10-CM | POA: Diagnosis not present

## 2023-05-16 DIAGNOSIS — N1831 Chronic kidney disease, stage 3a: Secondary | ICD-10-CM | POA: Diagnosis not present

## 2023-05-16 DIAGNOSIS — E039 Hypothyroidism, unspecified: Secondary | ICD-10-CM | POA: Diagnosis not present

## 2023-05-16 DIAGNOSIS — K2100 Gastro-esophageal reflux disease with esophagitis, without bleeding: Secondary | ICD-10-CM | POA: Diagnosis not present

## 2023-05-16 DIAGNOSIS — E782 Mixed hyperlipidemia: Secondary | ICD-10-CM | POA: Diagnosis not present

## 2023-05-24 DIAGNOSIS — T466X5A Adverse effect of antihyperlipidemic and antiarteriosclerotic drugs, initial encounter: Secondary | ICD-10-CM | POA: Diagnosis not present

## 2023-05-24 DIAGNOSIS — E7849 Other hyperlipidemia: Secondary | ICD-10-CM | POA: Diagnosis not present

## 2023-05-24 DIAGNOSIS — Z1389 Encounter for screening for other disorder: Secondary | ICD-10-CM | POA: Diagnosis not present

## 2023-05-24 DIAGNOSIS — R002 Palpitations: Secondary | ICD-10-CM | POA: Diagnosis not present

## 2023-05-24 DIAGNOSIS — G43909 Migraine, unspecified, not intractable, without status migrainosus: Secondary | ICD-10-CM | POA: Diagnosis not present

## 2023-05-24 DIAGNOSIS — R079 Chest pain, unspecified: Secondary | ICD-10-CM | POA: Diagnosis not present

## 2023-05-24 DIAGNOSIS — Z23 Encounter for immunization: Secondary | ICD-10-CM | POA: Diagnosis not present

## 2023-05-24 DIAGNOSIS — R7989 Other specified abnormal findings of blood chemistry: Secondary | ICD-10-CM | POA: Diagnosis not present

## 2023-05-24 DIAGNOSIS — M791 Myalgia, unspecified site: Secondary | ICD-10-CM | POA: Diagnosis not present

## 2023-05-24 DIAGNOSIS — M797 Fibromyalgia: Secondary | ICD-10-CM | POA: Diagnosis not present

## 2023-05-24 DIAGNOSIS — E876 Hypokalemia: Secondary | ICD-10-CM | POA: Diagnosis not present

## 2023-06-01 DIAGNOSIS — Z471 Aftercare following joint replacement surgery: Secondary | ICD-10-CM | POA: Diagnosis not present

## 2023-06-01 DIAGNOSIS — Z96652 Presence of left artificial knee joint: Secondary | ICD-10-CM | POA: Diagnosis not present

## 2023-06-04 ENCOUNTER — Encounter: Payer: Self-pay | Admitting: Family Medicine

## 2023-06-04 DIAGNOSIS — G43711 Chronic migraine without aura, intractable, with status migrainosus: Secondary | ICD-10-CM

## 2023-06-05 NOTE — Telephone Encounter (Signed)
Noted  

## 2023-06-06 MED ORDER — AIMOVIG 140 MG/ML ~~LOC~~ SOAJ: 1.0000 mL | SUBCUTANEOUS | 0 refills | Status: DC

## 2023-06-06 NOTE — Addendum Note (Signed)
Addended by: Arther Abbott on: 06/06/2023 09:09 AM   Modules accepted: Orders

## 2023-06-11 NOTE — Telephone Encounter (Signed)
Brought samples up front, husband picked up

## 2023-06-25 DIAGNOSIS — N1831 Chronic kidney disease, stage 3a: Secondary | ICD-10-CM | POA: Diagnosis not present

## 2023-06-25 DIAGNOSIS — E039 Hypothyroidism, unspecified: Secondary | ICD-10-CM | POA: Diagnosis not present

## 2023-07-02 DIAGNOSIS — F17200 Nicotine dependence, unspecified, uncomplicated: Secondary | ICD-10-CM | POA: Diagnosis not present

## 2023-07-02 DIAGNOSIS — Z0001 Encounter for general adult medical examination with abnormal findings: Secondary | ICD-10-CM | POA: Diagnosis not present

## 2023-07-05 ENCOUNTER — Encounter: Payer: Self-pay | Admitting: Family Medicine

## 2023-07-16 ENCOUNTER — Encounter (INDEPENDENT_AMBULATORY_CARE_PROVIDER_SITE_OTHER): Payer: Self-pay | Admitting: Gastroenterology

## 2023-07-16 ENCOUNTER — Ambulatory Visit (INDEPENDENT_AMBULATORY_CARE_PROVIDER_SITE_OTHER): Payer: Medicare Other | Admitting: Gastroenterology

## 2023-07-16 ENCOUNTER — Encounter (INDEPENDENT_AMBULATORY_CARE_PROVIDER_SITE_OTHER): Payer: Self-pay

## 2023-07-16 ENCOUNTER — Telehealth (INDEPENDENT_AMBULATORY_CARE_PROVIDER_SITE_OTHER): Payer: Self-pay | Admitting: Gastroenterology

## 2023-07-16 VITALS — BP 143/94 | HR 80 | Temp 97.5°F | Ht 64.0 in | Wt 152.2 lb

## 2023-07-16 DIAGNOSIS — K439 Ventral hernia without obstruction or gangrene: Secondary | ICD-10-CM

## 2023-07-16 DIAGNOSIS — K458 Other specified abdominal hernia without obstruction or gangrene: Secondary | ICD-10-CM | POA: Insufficient documentation

## 2023-07-16 DIAGNOSIS — K5900 Constipation, unspecified: Secondary | ICD-10-CM | POA: Insufficient documentation

## 2023-07-16 NOTE — Telephone Encounter (Signed)
Left message to return call. CT scan scheduled for 07/20/23 at 4:30pm at [facility]. Pt will need to arrive at 4:15pm.

## 2023-07-16 NOTE — Patient Instructions (Signed)
We will get a CT of your abdomen to further evaluate the hernia I will refer you to the general surgery group for further evaluation of possible surgical repair Increase water intake, aim for atleast 64 oz per day Increase fruits, veggies and whole grains, kiwi and prunes are especially good for constipation Start taking Miralax 1 capful every day for one week. If bowel movements do not improve, increase to 1 capful every 12 hours. If after two weeks there is no improvement, increase to 1 capful every 8 hours  Follow up 3 months  It was a pleasure to see you today. I want to create trusting relationships with patients and provide genuine, compassionate, and quality care. I truly value your feedback! please be on the lookout for a survey regarding your visit with me today. I appreciate your input about our visit and your time in completing this!    Florestine Carmical L. Jeanmarie Hubert, MSN, APRN, AGNP-C Adult-Gerontology Nurse Practitioner [facility] Gastroenterology at [facility]

## 2023-07-16 NOTE — Progress Notes (Signed)
Referring Provider: Estanislado Pandy, MD Primary Care Physician:  Estanislado Pandy, MD Primary GI Physician: Dr. Levon Hedger   Chief Complaint  Patient presents with   Hernia    Patient here today due to a periumbilical hernia she has that is growing and is painful.   HPI:   [name] [name] is a 52 y.o. female with past medical history of Graves disease, anxiety, HLD, macular degeneration   Patient presenting today for abdominal hernia and constipation   Last seen 2023, at that time here for positive cologuard. Having some atypical chest pain, taking pepcid. Recommended to schedule Colonoscopy.  Present: Patient states that hernia has become more painful in the past week. She notes that it hernia is pretty much staying out. Worse with eating. She has some nausea but no vomiting. She notes that she often has to lift/move her parents as they are elderly though she cannot pinpoint any specific activity that seemed to perpetuate worsening of this. She note some issues with harder stools, that began about a month ago, she is doing a stool softener nightly and trying to do a good amount of water. She notes that stool softeners do not seem to help a ton. Having a BM every few days.   [place] abd complete 11/2021: Abdominal wall hernia at the site of palpable abnormality. Hernia neck measures approximately 6 mm with hernia sac 19 x 8 mm. Contents increase with Valsalva, however no definite bowel peristalsis is seen. As clinically indicated, CT could be considered for more detailed assessment. 2. Otherwise unremarkable abdominal ultrasound. No evidence of abdominal mass. Last Colonoscopy:-11/2021 The examined portion of the ileum was normal.                           - One 3 mm polyp in the transverse colon, removed                            with a cold biopsy forceps. Resected and retrieved.                           - Non-bleeding internal hemorrhoids.  Last Endoscopy:  Recommendations:  Repeat  colonoscopy in 2030  Past Medical History:  Diagnosis Date   Anxiety    Bulging lumbar disc    Cervical and thoracic   Family history of adverse reaction to anesthesia    sister PONV   Fibromyalgia    GERD (gastroesophageal reflux disease)    Graves disease    Hyperlipidemia    Hyperthyroidism    Macular degeneration    Migraine    PONV (postoperative nausea and vomiting)    stays awake for 2 days after anesthesia    Past Surgical History:  Procedure Laterality Date   APPENDECTOMY  2005   COLONOSCOPY WITH PROPOFOL N/A 11/09/2021   Procedure: COLONOSCOPY WITH PROPOFOL;  Surgeon: Dolores Frame, MD;  [facility];  Service: Gastroenterology;  Laterality: N/A;  1:05   KNEE SURGERY Left 06/08/2021   LIPOMA RESECTION  2008   PARTIAL HYSTERECTOMY  2005   POLYPECTOMY  11/09/2021   Procedure: POLYPECTOMY;  Surgeon: Dolores Frame, MD;  Location: [facility];  Service: Gastroenterology;;   SHOULDER SURGERY  2021   TOTAL KNEE ARTHROPLASTY Left 09/04/2022   Procedure: TOTAL KNEE ARTHROPLASTY;  Surgeon: Ollen Gross, MD;  Location: [facility];  Service: Orthopedics;  Laterality: Left;    Current Outpatient Medications  Medication Sig Dispense Refill   acetaminophen (TYLENOL) 500 MG tablet Take 500-1,000 mg by mouth every 6 (six) hours as needed (pain.).     AIMOVIG 140 MG/ML SOAJ Inject 140 mg into the skin every 30 (thirty) days. 2 mL 0   ALPRAZolam (XANAX) 0.5 MG tablet Take 0.5 mg by mouth 2 (two) times daily as needed for anxiety.     amitriptyline (ELAVIL) 50 MG tablet Take 50 mg by mouth at bedtime.     Erenumab-aooe (AIMOVIG) 140 MG/ML SOAJ Inject 140 mg into the skin every 30 (thirty) days. 3 mL 3   ergocalciferol (VITAMIN D2) 50000 units capsule Take 50,000 Units by mouth every Friday.     famotidine (PEPCID) 20 MG tablet Take 20 mg by mouth 2 (two) times daily.     fenofibrate (TRICOR) 145 MG tablet Take 145 mg by mouth daily.     fluticasone  (FLONASE) 50 MCG/ACT nasal spray Place 2 sprays into both nostrils daily as needed (allergies/congestion.).     levothyroxine (SYNTHROID) 100 MCG tablet Take 100 mcg by mouth daily before breakfast. Alternating between 100 mcg and 112 mcg every other morning.     Omega-3 Fatty Acids (OMEGA-3 FISH OIL PO) Take 6,300 mg by mouth every evening. 3 capsules     ondansetron (ZOFRAN-ODT) 4 MG disintegrating tablet Take 1 tablet (4 mg total) by mouth every 8 (eight) hours as needed for nausea or vomiting. Max 30 per month. 30 tablet 1   oxyCODONE (OXY IR/ROXICODONE) 5 MG immediate release tablet Take 1-2 tablets (5-10 mg total) by mouth every 6 (six) hours as needed for severe pain. 42 tablet 0   propranolol ER (INDERAL LA) 160 MG SR capsule Take 160 mg by mouth at bedtime.     rizatriptan (MAXALT-MLT) 10 MG disintegrating tablet Take 1 tablet (10 mg total) by mouth as needed for up to 1 dose for migraine. May repeat in 2 hours if needed. Max 2 tabs in 24 hours. 9 tablet 11   traMADol (ULTRAM) 50 MG tablet Take 1-2 tablets (50-100 mg total) by mouth every 6 (six) hours as needed for moderate pain. 42 tablet 0   No current facility-administered medications for this visit.    Allergies as of 07/16/2023 - Review Complete 07/16/2023  Allergen Reaction Noted   Prednisone  05/21/2008   Tape Other (See Comments) 08/17/2022    Family History  Problem Relation Age of Onset   Hypertension Mother    Hypothyroidism Mother    Heart attack Father    Hypertension Father    Diabetes Brother    Breast cancer Sister    Uterine cancer Maternal Grandmother    Esophageal cancer Paternal Uncle    Bladder Cancer Paternal Uncle    Bladder Cancer Paternal Uncle     Social History   Socioeconomic History   Marital status: Married    Spouse name: Casimiro Needle   Number of children: 1   Years of education: [employer] education level: Some college, no degree  Occupational History   Occupation: other     Comment: Disability  Tobacco Use   Smoking status: Every Day    Current packs/day: 0.25    Average packs/day: 0.3 packs/day for 20.0 years (5.0 ttl pk-yrs)    Types: Cigarettes   Smokeless tobacco: Never  Vaping Use   Vaping status: Never Used  Substance and Sexual Activity   Alcohol use: Never  Drug use: Never   Sexual activity: Not on file  Other Topics Concern   Not on file  Social History Narrative   Patient lives at home with family.   Caffeine Use: 1 cans of soda daily   Right handed   Social Determinants of Health   Financial Resource Strain: Not on file  Food Insecurity: No Food Insecurity (09/04/2022)   Hunger Vital Sign    Worried About Running Out of Food in the Last Year: Never true    Ran Out of Food in the Last Year: Never true  Transportation Needs: No Transportation Needs (09/04/2022)   PRAPARE - Administrator, Civil Service (Medical): No    Lack of Transportation (Non-Medical): No  Physical Activity: Not on file  Stress: Not on file  Social Connections: Not on file    Review of systems General: negative for malaise, night sweats, fever, chills, weight loss Neck: Negative for lumps, goiter, pain and significant neck swelling Resp: Negative for cough, wheezing, dyspnea at rest CV: Negative for chest pain, leg swelling, palpitations, orthopnea GI: denies melena, hematochezia,  vomiting, diarrhea, dysphagia, odyonophagia, early satiety or unintentional weight loss. +abdominal pain +abdominal mass +nausea +constipation  MSK: Negative for joint pain or swelling, back pain, and muscle pain. Derm: Negative for itching or rash Psych: Denies depression, anxiety, memory loss, confusion. No homicidal or suicidal ideation.  Heme: Negative for prolonged bleeding, bruising easily, and swollen nodes. Endocrine: Negative for cold or heat intolerance, polyuria, polydipsia and goiter. Neuro: negative for tremor, gait imbalance, syncope and seizures. The  remainder of the review of systems is noncontributory.  Physical Exam: Ht 5\' 4"  (1.626 m)   Wt 152 lb 3.2 oz (69 kg)   BMI 26.13 kg/m  General:   Alert and oriented. No distress noted. Pleasant and cooperative.  Head:  Normocephalic and atraumatic. Eyes:  Conjuctiva clear without scleral icterus. Mouth:  Oral mucosa pink and moist. Good dentition. No lesions. Heart: Normal rate and rhythm, s1 and s2 heart sounds present.  Lungs: Clear lung sounds in all lobes. Respirations equal and unlabored. Abdomen:  +BS,  No rebound or guarding. No HSM. Hernia noted just above umbilicus, TTP though abdomen is soft.  Derm: No palmar erythema or jaundice Msk:  Symmetrical without gross deformities. Normal posture. Extremities:  Without edema. Neurologic:  Alert and  oriented x4 Psych:  Alert and cooperative. Normal mood and affect.  Invalid input(s): "6 MONTHS"   ASSESSMENT: ELOIZA WENDLANDT is a 52 y.o. female presenting today for abdominal hernia and constipation   Patient with ultrasound in April 2023 with abdominal wall hernia measuring approximately 6 mm with hernia sac 19 x 8 mm.  At that time patient was having no pain so surgical evaluation was deferred.  Patient reports more recently she is having pain at site of hernia as well as some nausea.  Notes she could previously reduce hernia manually but now is unable to.  Also having some new constipation over the past month, denies any vomiting.  At this time will obtain CT abdomen for further evaluation of her hernia, do not suspect at this time that hernia is incarcerated though we will refer her to general surgery for discussion of possible surgical repair of hernia.  For constipation she can Start taking Miralax.    PLAN:  Referral to general surgery  2. Increase water intake, aim for atleast 64 oz per day Increase fruits, veggies and whole grains, kiwi and prunes are especially  good for constipation 3. Start taking Miralax 1 capful every  day for one week. If bowel movements do not improve, increase to 1 capful every 12 hours. If after two weeks there is no improvement, increase to 1 capful every 8 hours 4. CT abdomen with contrast   All questions were answered, patient verbalized understanding and is in agreement with plan as outlined above.   Follow Up: 3 months   Marieelena Bartko L. Jeanmarie Hubert, MSN, APRN, AGNP-C Adult-Gerontology Nurse Practitioner [facility]  I have reviewed the note and agree with the APP's assessment as described in this progress note  Katrinka Blazing, MD Gastroenterology and Hepatology [facility]

## 2023-07-20 ENCOUNTER — Ambulatory Visit (HOSPITAL_COMMUNITY)
Admission: RE | Admit: 2023-07-20 | Discharge: 2023-07-20 | Disposition: A | Discharge status: Home / Self Care | Payer: Medicare Other | Source: Ambulatory Visit | Attending: Gastroenterology | Admitting: Gastroenterology

## 2023-07-20 DIAGNOSIS — K5900 Constipation, unspecified: Secondary | ICD-10-CM | POA: Diagnosis not present

## 2023-07-20 DIAGNOSIS — N281 Cyst of kidney, acquired: Secondary | ICD-10-CM | POA: Diagnosis not present

## 2023-07-20 DIAGNOSIS — K458 Other specified abdominal hernia without obstruction or gangrene: Secondary | ICD-10-CM | POA: Diagnosis not present

## 2023-07-20 DIAGNOSIS — R109 Unspecified abdominal pain: Secondary | ICD-10-CM | POA: Diagnosis not present

## 2023-07-20 DIAGNOSIS — K429 Umbilical hernia without obstruction or gangrene: Secondary | ICD-10-CM | POA: Diagnosis not present

## 2023-07-20 MED ORDER — IOHEXOL 300 MG/ML  SOLN
100.0000 mL | Freq: Once | INTRAMUSCULAR | Status: AC | PRN
  Administered 2023-07-20: 100 mL via INTRAVENOUS

## 2023-07-30 ENCOUNTER — Other Ambulatory Visit (INDEPENDENT_AMBULATORY_CARE_PROVIDER_SITE_OTHER): Payer: Self-pay | Admitting: Gastroenterology

## 2023-07-30 MED ORDER — DICYCLOMINE HCL 10 MG PO CAPS: 10.0000 mg | ORAL_CAPSULE | Freq: Three times a day (TID) | ORAL | 1 refills | Status: DC | PRN

## 2023-08-09 ENCOUNTER — Ambulatory Visit: Payer: Medicare Other | Admitting: Surgery

## 2023-08-09 ENCOUNTER — Encounter: Payer: Self-pay | Admitting: Family Medicine

## 2023-08-13 ENCOUNTER — Encounter: Payer: Self-pay | Admitting: Surgery

## 2023-08-13 ENCOUNTER — Ambulatory Visit: Payer: Medicare Other | Admitting: Surgery

## 2023-08-13 ENCOUNTER — Other Ambulatory Visit: Payer: Self-pay

## 2023-08-13 VITALS — BP 141/89 | HR 67 | Temp 97.6°F | Resp 18 | Ht 64.0 in | Wt 151.0 lb

## 2023-08-13 DIAGNOSIS — K429 Umbilical hernia without obstruction or gangrene: Secondary | ICD-10-CM

## 2023-08-13 NOTE — Progress Notes (Signed)
 [facility] History and Physical  Reason for Referral: periumbilical hernia  Referring Physician: Mitzie Boettcher, NP  Chief Complaint   New Patient (Initial Visit)     [name] [name] is a 53 y.o. female.  HPI: Patient presents for evaluation of a periumbilical hernia.  She underwent colonoscopy 1 to 2 years ago, and was told that she had a hernia at that time.  Was also advised that if it was not bothering her, she did not need urgent surgical interventions.  Starting about a month ago, she started having increased achy pain at the hernia site.  Now she is having pain anytime she pushes on the area.  She has daily nausea which has been happening for a while but denies any episodes of emesis.  She is also having normal bowel movements.  Her past medical history significant for migraines, fibromyalgia, Graves' disease, GERD, and hypertriglyceridemia.  Her surgical history is significant for partial hysterectomy at which time an appendectomy was also performed.  She has also had 2 right shoulder surgeries, and a left knee replacement.  She smokes 1/2 pack of cigarettes per day and is trying to cut down.  She denies use of alcohol  and illicit drugs.  Past Medical History:  Diagnosis Date   Anxiety    Bulging lumbar disc    Cervical and thoracic   Family history of adverse reaction to anesthesia    sister PONV   Fibromyalgia    GERD (gastroesophageal reflux disease)    Graves disease    Hyperlipidemia    Hyperthyroidism    Macular degeneration    Migraine    PONV (postoperative nausea and vomiting)    stays awake for 2 days after anesthesia    Past Surgical History:  Procedure Laterality Date   APPENDECTOMY  2005   COLONOSCOPY WITH PROPOFOL  N/A 11/09/2021   Procedure: COLONOSCOPY WITH PROPOFOL ;  Surgeon: Eartha Angelia Sieving, MD;  [facility];  Service: Gastroenterology;  Laterality: N/A;  1:05   KNEE SURGERY Left 06/08/2021   LIPOMA RESECTION  2008    PARTIAL HYSTERECTOMY  2005   POLYPECTOMY  11/09/2021   Procedure: POLYPECTOMY;  Surgeon: Eartha Angelia Sieving, MD;  [facility];  Service: Gastroenterology;;   SHOULDER SURGERY  2021   TOTAL KNEE ARTHROPLASTY Left 09/04/2022   Procedure: TOTAL KNEE ARTHROPLASTY;  Surgeon: Melodi Lerner, MD;  Location: [facility];  Service: Orthopedics;  Laterality: Left;    Family History  Problem Relation Age of Onset   Hypertension Mother    Hypothyroidism Mother    Heart attack Father    Hypertension Father    Diabetes Brother    Breast cancer Sister    Uterine cancer Maternal Grandmother    Esophageal cancer Paternal Uncle    Bladder Cancer Paternal Uncle    Bladder Cancer Paternal Uncle     Social History   Tobacco Use   Smoking status: Every Day    Current packs/day: 0.25    Average packs/day: 0.3 packs/day for 20.0 years (5.0 ttl pk-yrs)    Types: Cigarettes   Smokeless tobacco: Never  Vaping Use   Vaping status: Never Used  Substance Use Topics   Alcohol  use: Never   Drug use: Never    Medications: I have reviewed the patient's current medications. Allergies as of 08/13/2023       Reactions   Prednisone Other (See Comments)   In pill form causes extreme palpitations but patient can take injection form  Tape Other (See Comments)   Blistering skin        Medication List        Accurate as of August 13, 2023 11:21 AM. If you have any questions, ask your nurse or doctor.          acetaminophen  500 MG tablet Commonly known as: TYLENOL  Take 500-1,000 mg by mouth every 6 (six) hours as needed (pain.).   Aimovig  140 MG/ML Soaj Generic drug: Erenumab -aooe Inject 140 mg into the skin every 30 (thirty) days.   Aimovig  140 MG/ML Soaj Generic drug: Erenumab -aooe Inject 140 mg into the skin every 30 (thirty) days.   ALPRAZolam  0.5 MG tablet Commonly known as: XANAX  Take 0.5 mg by mouth 2 (two) times daily as needed for anxiety.   amitriptyline  50 MG  tablet Commonly known as: ELAVIL  Take 50 mg by mouth at bedtime.   dicyclomine  10 MG capsule Commonly known as: BENTYL  Take 1 capsule (10 mg total) by mouth 3 (three) times daily as needed for spasms.   ergocalciferol  1.25 MG (50000 UT) capsule Commonly known as: VITAMIN D2 Take 50,000 Units by mouth every Friday.   famotidine  20 MG tablet Commonly known as: PEPCID  Take 20 mg by mouth 2 (two) times daily.   fenofibrate 145 MG tablet Commonly known as: TRICOR Take 145 mg by mouth daily.   fluticasone  50 MCG/ACT nasal spray Commonly known as: FLONASE  Place 2 sprays into both nostrils daily as needed (allergies/congestion.).   levothyroxine  100 MCG tablet Commonly known as: SYNTHROID  Take 100 mcg by mouth daily before breakfast. Alternating between 100 mcg and 112 mcg every other morning.   OMEGA-3 FISH OIL PO Take 6,300 mg by mouth every evening. 3 capsules   ondansetron  4 MG disintegrating tablet Commonly known as: ZOFRAN -ODT Take 1 tablet (4 mg total) by mouth every 8 (eight) hours as needed for nausea or vomiting. Max 30 per month.   propranolol  ER 160 MG SR capsule Commonly known as: INDERAL  LA Take 160 mg by mouth at bedtime.   rizatriptan  10 MG disintegrating tablet Commonly known as: MAXALT -MLT Take 1 tablet (10 mg total) by mouth as needed for up to 1 dose for migraine. May repeat in 2 hours if needed. Max 2 tabs in 24 hours.         ROS:  Constitutional: negative for chills, fatigue, and fevers Eyes: positive for visual disturbance, negative for pain Ears, nose, mouth, throat, and face: negative for ear drainage, sore throat, and sinus problems Respiratory: negative for cough, wheezing, and shortness of breath Cardiovascular: negative for chest pain and palpitations Gastrointestinal: positive for abdominal pain, nausea, and reflux symptoms, negative for vomiting Genitourinary:negative for dysuria and frequency Integument/breast: negative for dryness and  rash Hematologic/lymphatic: negative for bleeding and lymphadenopathy Musculoskeletal:positive for back pain and neck pain Neurological: negative for dizziness and tremors Endocrine: negative for temperature intolerance  Blood pressure (!) 141/89, pulse 67, temperature 97.6 F (36.4 C), temperature source Oral, resp. rate 18, height 5' 4 (1.626 m), weight 151 lb (68.5 kg), SpO2 90%. Physical Exam Vitals reviewed.  Constitutional:      Appearance: Normal appearance.  HENT:     Head: Normocephalic and atraumatic.  Eyes:     Extraocular Movements: Extraocular movements intact.     Pupils: Pupils are equal, round, and reactive to light.  Cardiovascular:     Rate and Rhythm: Normal rate and regular rhythm.  Pulmonary:     Effort: Pulmonary effort is normal.  Breath sounds: Normal breath sounds.  Abdominal:     Comments: Abdomen soft, nondistended, no percussion tenderness, nontender to palpation; no rigidity, guarding, rebound tenderness; soft and reducible umbilical hernia, mild tenderness to palpation at hernia site  Musculoskeletal:        General: Normal range of motion.     Cervical back: Normal range of motion.  Skin:    General: Skin is warm and dry.  Neurological:     General: No focal deficit present.     Mental Status: She is alert and oriented to person, place, and time.  Psychiatric:        Mood and Affect: Mood normal.        Behavior: Behavior normal.     Results: CT abdomen and pelvis (07/20/23): IMPRESSION: Small fat containing periumbilical hernia, without fluid or inflammatory changes.   Hepatic steatosis.   Additional ancillary findings as above.  Assessment & Plan:  [name] [name] is a 53 y.o. female who presents for evaluation of a umbilical hernia.   -I discussed the pathophysiology of umbilical hernia and we discussed the need for surgical repair -The risk and benefits of robotic assisted laparoscopic umbilical hernia repair with mesh were  discussed including but not limited to bleeding, infection, injury to surrounding structures, need for additional procedures, poor wound healing, and hernia recurrence.  After careful consideration, Jonna Dittrich has decided to proceed with surgery.  -Patient tentatively scheduled for surgery on 1/15 -Information provided to the patient regarding umbilical hernias -Advised that the patient should present to the ED if they begin to have worsening pain at her hernia site with a nonreducible bulge, nausea, vomiting, and obstipation  All questions were answered to the satisfaction of the patient.  Dorothyann Brittle, DO [facility] [address] [place] [address] [place], [place] [address] [phone] (office)

## 2023-08-14 ENCOUNTER — Other Ambulatory Visit: Payer: Self-pay

## 2023-08-14 DIAGNOSIS — G43711 Chronic migraine without aura, intractable, with status migrainosus: Secondary | ICD-10-CM

## 2023-08-14 MED ORDER — AIMOVIG 140 MG/ML ~~LOC~~ SOAJ: 140.0000 mg | SUBCUTANEOUS | 3 refills | Status: DC

## 2023-08-14 NOTE — H&P (Signed)
 [facility] History and Physical  Reason for Referral: periumbilical hernia  Referring Physician: Mitzie Boettcher, NP  Chief Complaint   New Patient (Initial Visit)     [name] [name] is a 53 y.o. female.  HPI: Patient presents for evaluation of a periumbilical hernia.  She underwent colonoscopy 1 to 2 years ago, and was told that she had a hernia at that time.  Was also advised that if it was not bothering her, she did not need urgent surgical interventions.  Starting about a month ago, she started having increased achy pain at the hernia site.  Now she is having pain anytime she pushes on the area.  She has daily nausea which has been happening for a while but denies any episodes of emesis.  She is also having normal bowel movements.  Her past medical history significant for migraines, fibromyalgia, Graves' disease, GERD, and hypertriglyceridemia.  Her surgical history is significant for partial hysterectomy at which time an appendectomy was also performed.  She has also had 2 right shoulder surgeries, and a left knee replacement.  She smokes 1/2 pack of cigarettes per day and is trying to cut down.  She denies use of alcohol  and illicit drugs.  Past Medical History:  Diagnosis Date   Anxiety    Bulging lumbar disc    Cervical and thoracic   Family history of adverse reaction to anesthesia    sister PONV   Fibromyalgia    GERD (gastroesophageal reflux disease)    Graves disease    Hyperlipidemia    Hyperthyroidism    Macular degeneration    Migraine    PONV (postoperative nausea and vomiting)    stays awake for 2 days after anesthesia    Past Surgical History:  Procedure Laterality Date   APPENDECTOMY  2005   COLONOSCOPY WITH PROPOFOL  N/A 11/09/2021   Procedure: COLONOSCOPY WITH PROPOFOL ;  Surgeon: Eartha Angelia Sieving, MD;  Location: [facility];  Service: Gastroenterology;  Laterality: N/A;  1:05   KNEE SURGERY Left 06/08/2021   LIPOMA RESECTION  2008    PARTIAL HYSTERECTOMY  2005   POLYPECTOMY  11/09/2021   Procedure: POLYPECTOMY;  Surgeon: Eartha Angelia Sieving, MD;  Location: [facility];  Service: Gastroenterology;;   SHOULDER SURGERY  2021   TOTAL KNEE ARTHROPLASTY Left 09/04/2022   Procedure: TOTAL KNEE ARTHROPLASTY;  Surgeon: Melodi Lerner, MD;  Location: [facility];  Service: Orthopedics;  Laterality: Left;    Family History  Problem Relation Age of Onset   Hypertension Mother    Hypothyroidism Mother    Heart attack Father    Hypertension Father    Diabetes Brother    Breast cancer Sister    Uterine cancer Maternal Grandmother    Esophageal cancer Paternal Uncle    Bladder Cancer Paternal Uncle    Bladder Cancer Paternal Uncle     Social History   Tobacco Use   Smoking status: Every Day    Current packs/day: 0.25    Average packs/day: 0.3 packs/day for 20.0 years (5.0 ttl pk-yrs)    Types: Cigarettes   Smokeless tobacco: Never  Vaping Use   Vaping status: Never Used  Substance Use Topics   Alcohol  use: Never   Drug use: Never    Medications: I have reviewed the patient's current medications. Allergies as of 08/13/2023       Reactions   Prednisone Other (See Comments)   In pill form causes extreme palpitations but patient can take injection form  Tape Other (See Comments)   Blistering skin        Medication List        Accurate as of August 13, 2023 11:21 AM. If you have any questions, ask your nurse or doctor.          acetaminophen  500 MG tablet Commonly known as: TYLENOL  Take 500-1,000 mg by mouth every 6 (six) hours as needed (pain.).   Aimovig  140 MG/ML Soaj Generic drug: Erenumab -aooe Inject 140 mg into the skin every 30 (thirty) days.   Aimovig  140 MG/ML Soaj Generic drug: Erenumab -aooe Inject 140 mg into the skin every 30 (thirty) days.   ALPRAZolam  0.5 MG tablet Commonly known as: XANAX  Take 0.5 mg by mouth 2 (two) times daily as needed for anxiety.   amitriptyline  50 MG  tablet Commonly known as: ELAVIL  Take 50 mg by mouth at bedtime.   dicyclomine  10 MG capsule Commonly known as: BENTYL  Take 1 capsule (10 mg total) by mouth 3 (three) times daily as needed for spasms.   ergocalciferol  1.25 MG (50000 UT) capsule Commonly known as: VITAMIN D2 Take 50,000 Units by mouth every Friday.   famotidine  20 MG tablet Commonly known as: PEPCID  Take 20 mg by mouth 2 (two) times daily.   fenofibrate 145 MG tablet Commonly known as: TRICOR Take 145 mg by mouth daily.   fluticasone  50 MCG/ACT nasal spray Commonly known as: FLONASE  Place 2 sprays into both nostrils daily as needed (allergies/congestion.).   levothyroxine  100 MCG tablet Commonly known as: SYNTHROID  Take 100 mcg by mouth daily before breakfast. Alternating between 100 mcg and 112 mcg every other morning.   OMEGA-3 FISH OIL PO Take 6,300 mg by mouth every evening. 3 capsules   ondansetron  4 MG disintegrating tablet Commonly known as: ZOFRAN -ODT Take 1 tablet (4 mg total) by mouth every 8 (eight) hours as needed for nausea or vomiting. Max 30 per month.   propranolol  ER 160 MG SR capsule Commonly known as: INDERAL  LA Take 160 mg by mouth at bedtime.   rizatriptan  10 MG disintegrating tablet Commonly known as: MAXALT -MLT Take 1 tablet (10 mg total) by mouth as needed for up to 1 dose for migraine. May repeat in 2 hours if needed. Max 2 tabs in 24 hours.         ROS:  Constitutional: negative for chills, fatigue, and fevers Eyes: positive for visual disturbance, negative for pain Ears, nose, mouth, throat, and face: negative for ear drainage, sore throat, and sinus problems Respiratory: negative for cough, wheezing, and shortness of breath Cardiovascular: negative for chest pain and palpitations Gastrointestinal: positive for abdominal pain, nausea, and reflux symptoms, negative for vomiting Genitourinary:negative for dysuria and frequency Integument/breast: negative for dryness and  rash Hematologic/lymphatic: negative for bleeding and lymphadenopathy Musculoskeletal:positive for back pain and neck pain Neurological: negative for dizziness and tremors Endocrine: negative for temperature intolerance  Blood pressure (!) 141/89, pulse 67, temperature 97.6 F (36.4 C), temperature source Oral, resp. rate 18, height 5' 4 (1.626 m), weight 151 lb (68.5 kg), SpO2 90%. Physical Exam Vitals reviewed.  Constitutional:      Appearance: Normal appearance.  HENT:     Head: Normocephalic and atraumatic.  Eyes:     Extraocular Movements: Extraocular movements intact.     Pupils: Pupils are equal, round, and reactive to light.  Cardiovascular:     Rate and Rhythm: Normal rate and regular rhythm.  Pulmonary:     Effort: Pulmonary effort is normal.  Breath sounds: Normal breath sounds.  Abdominal:     Comments: Abdomen soft, nondistended, no percussion tenderness, nontender to palpation; no rigidity, guarding, rebound tenderness; soft and reducible umbilical hernia, mild tenderness to palpation at hernia site  Musculoskeletal:        General: Normal range of motion.     Cervical back: Normal range of motion.  Skin:    General: Skin is warm and dry.  Neurological:     General: No focal deficit present.     Mental Status: She is alert and oriented to person, place, and time.  Psychiatric:        Mood and Affect: Mood normal.        Behavior: Behavior normal.     Results: CT abdomen and pelvis (07/20/23): IMPRESSION: Small fat containing periumbilical hernia, without fluid or inflammatory changes.   Hepatic steatosis.   Additional ancillary findings as above.  Assessment & Plan:  [name] [name] is a 53 y.o. female who presents for evaluation of a umbilical hernia.   -I discussed the pathophysiology of umbilical hernia and we discussed the need for surgical repair -The risk and benefits of robotic assisted laparoscopic umbilical hernia repair with mesh were  discussed including but not limited to bleeding, infection, injury to surrounding structures, need for additional procedures, poor wound healing, and hernia recurrence.  After careful consideration, [name] [name] has decided to proceed with surgery.  -Patient tentatively scheduled for surgery on 1/15 -Information provided to the patient regarding umbilical hernias -Advised that the patient should present to the ED if they begin to have worsening pain at her hernia site with a nonreducible bulge, nausea, vomiting, and obstipation  All questions were answered to the satisfaction of the patient.  Dorothyann Brittle, DO [facility] [address] [place] [address], [place] [address] [phone] (office)

## 2023-08-20 NOTE — Patient Instructions (Signed)
 [name] [name]  08/20/2023     @PREFPERIOPPHARMACY @   Your procedure is scheduled on  08/22/2023.    Report to [facility] at  0600  A.M.   Call this number if you have problems the morning of surgery:  [phone]  If you experience any cold or flu symptoms such as cough, fever, chills, shortness of breath, etc. between now and your scheduled surgery, please notify us  at the above number.   Remember:  Do not eat after midnight.   You may drink clear liquids until 0330 am on 08/22/2023.    Clear liquids allowed are:                    Water , Juice (No red color; non-citric and without pulp; diabetics please choose diet or no sugar options), Carbonated beverages (diabetics please choose diet or no sugar options), Clear Tea (No creamer, milk, or cream, including half & half and powdered creamer), Black Coffee Only (No creamer, milk or cream, including half & half and powdered creamer), and Clear Sports drink (No red color; diabetics please choose diet or no sugar options)    Take these medicines the morning of surgery with A SIP OF WATER            alprazolam  (if needed), famotidine , levothyroxine , ondansetron (if needed), rizatriptan  (if needed).     Do not wear jewelry, make-up or nail polish, including gel polish,  artificial nails, or any other type of covering on natural nails (fingers and  toes).  Do not wear lotions, powders, or perfumes, or deodorant.  Do not shave 48 hours prior to surgery.  Men may shave face and neck.  Do not bring valuables to the hospital.  [facility] is not responsible for any belongings or valuables.  Contacts, dentures or bridgework may not be worn into surgery.  Leave your suitcase in the car.  After surgery it may be brought to your room.  For patients admitted to the hospital, discharge time will be determined by your treatment team.  Patients discharged the day of surgery will not be allowed to drive home and must have someone  with them for 24 hours.    Special instructions:   DO NOT smoke tobacco or vape for 24 hours before your procedure.  Please read over the following fact sheets that you were given. Coughing and Deep Breathing, Surgical Site Infection Prevention, Anesthesia Post-op Instructions, and Care and Recovery After Surgery       Laparoscopic Ventral Hernia Repair, Care After The following information offers guidance on how to care for yourself after your procedure. Your health care provider may also give you more specific instructions. If you have problems or questions, contact your health care provider. What can I expect after the procedure? After the procedure, it is common to have pain, discomfort, or soreness. Follow these instructions at home: Medicines Take over-the-counter and prescription medicines only as told by your health care provider. Ask your health care provider if the medicine prescribed to you: Requires you to avoid driving or using machinery. Can cause constipation. You may need to take these actions to prevent or treat constipation: Drink enough fluid to keep your urine pale yellow. Take over-the-counter or prescription medicines. Eat foods that are high in fiber, such as beans, whole grains, and fresh fruits and vegetables. Limit foods that are high in fat and processed sugars, such as fried or sweet foods. Incision care  Follow  instructions from your health care provider about how to take care of your incisions. Make sure you: Wash your hands with soap and water  for at least 20 seconds before and after you change your bandage (dressing) or before you touch your abdomen. If soap and water  are not available, use hand sanitizer. Change your dressing as told by your health care provider. Leave stitches (sutures), skin glue, or adhesive strips in place. These skin closures may need to stay in place for 2 weeks or longer. If adhesive strip edges start to loosen and curl up, you  may trim the loose edges. Do not remove adhesive strips completely unless your health care provider tells you to do that. Check your incision areas every day for signs of infection. Check for: More redness, swelling, or pain. Fluid or blood. Warmth. Pus or a bad smell. Bathing  Do not take baths, swim, or use a hot tub until your health care provider approves. Ask your health care provider if you may take showers. You may only be allowed to take sponge baths. Keep your dressing dry until your health care provider says it can be removed. Activity  Rest as told by your health care provider. Avoid sitting for a long time without moving. Get up to take short walks every 1-2 hours. This is important to improve blood flow and breathing. Ask for help if you feel weak or unsteady. Do not lift anything that is heavier than 10 lb (4.5 kg), or the limit that you are told, until your health care provider says that it is safe. If you were given a sedative during the procedure, it can affect you for several hours. Do not drive or operate machinery until your health care provider says that it is safe. Return to your normal activities as told by your health care provider. Ask your health care provider what activities are safe for you. General instructions  Hold a pillow over your abdomen when you cough or sneeze. This helps with pain. Do not use any products that contain nicotine or tobacco. These products include cigarettes, chewing tobacco, and vaping devices, such as e-cigarettes. These can delay healing after surgery. If you need help quitting, ask your health care provider. You may be asked to continue to do deep breathing exercises at home. This will help to prevent a lung infection. Keep all follow-up visits. This is important. Contact a health care provider if: You have any of these signs of infection: More redness, swelling, or pain around an incision. Fluid or blood coming from an  incision. Warmth coming from an incision. Pus or a bad smell coming from an incision. A fever or chills. You have pain that gets worse or does not get better with medicine. You have nausea or vomiting. You have a cough. You have shortness of breath. You have not had a bowel movement in 3 days. You are not able to urinate. Get help right away if you have: Severe pain in your abdomen. Persistent nausea and vomiting. Redness, warmth, or pain in your leg. Chest pain. Trouble breathing. These symptoms may represent a serious problem that is an emergency. Do not wait to see if the symptoms will go away. Get medical help right away. Call your local emergency services (911 in the [place] Do not drive yourself to the hospital. Summary After this procedure, it is common to have pain, discomfort, or soreness. Follow instructions from your health care provider about how to take care of your incision. Check  your incision area every day for signs of infection. Report any signs of infection to your health care provider. Keep all follow-up visits. This is important. This information is not intended to replace advice given to you by your health care provider. Make sure you discuss any questions you have with your health care provider. Document Revised: 03/12/2020 Document Reviewed: 03/12/2020 Elsevier Patient Education  2024 [employer] General Anesthesia, Adult, Care After The following information offers guidance on how to care for yourself after your procedure. Your health care provider may also give you more specific instructions. If you have problems or questions, contact your health care provider. What can I expect after the procedure? After the procedure, it is common for people to: Have pain or discomfort at the IV site. Have nausea or vomiting. Have a sore throat or hoarseness. Have trouble concentrating. Feel cold or chills. Feel weak, sleepy, or tired (fatigue). Have soreness and body  aches. These can affect parts of the body that were not involved in surgery. Follow these instructions at home: For the time period you were told by your health care provider:  Rest. Do not participate in activities where you could fall or become injured. Do not drive or use machinery. Do not drink alcohol . Do not take sleeping pills or medicines that cause drowsiness. Do not make important decisions or sign legal documents. Do not take care of children on your own. General instructions Drink enough fluid to keep your urine pale yellow. If you have sleep apnea, surgery and certain medicines can increase your risk for breathing problems. Follow instructions from your health care provider about wearing your sleep device: Anytime you are sleeping, including during daytime naps. While taking prescription pain medicines, sleeping medicines, or medicines that make you drowsy. Return to your normal activities as told by your health care provider. Ask your health care provider what activities are safe for you. Take over-the-counter and prescription medicines only as told by your health care provider. Do not use any products that contain nicotine or tobacco. These products include cigarettes, chewing tobacco, and vaping devices, such as e-cigarettes. These can delay incision healing after surgery. If you need help quitting, ask your health care provider. Contact a health care provider if: You have nausea or vomiting that does not get better with medicine. You vomit every time you eat or drink. You have pain that does not get better with medicine. You cannot urinate or have bloody urine. You develop a skin rash. You have a fever. Get help right away if: You have trouble breathing. You have chest pain. You vomit blood. These symptoms may be an emergency. Get help right away. Call 911. Do not wait to see if the symptoms will go away. Do not drive yourself to the hospital. Summary After the  procedure, it is common to have a sore throat, hoarseness, nausea, vomiting, or to feel weak, sleepy, or fatigue. For the time period you were told by your health care provider, do not drive or use machinery. Get help right away if you have difficulty breathing, have chest pain, or vomit blood. These symptoms may be an emergency. This information is not intended to replace advice given to you by your health care provider. Make sure you discuss any questions you have with your health care provider. Document Revised: 10/21/2021 Document Reviewed: 10/21/2021 Elsevier Patient Education  2024 [employer] How to Use Chlorhexidine  Before Surgery Chlorhexidine  gluconate (CHG) is a germ-killing (antiseptic) solution that is used to clean  the skin. It can get rid of the bacteria that normally live on the skin and can keep them away for about 24 hours. To clean your skin with CHG, you may be given: A CHG solution to use in the shower or as part of a sponge bath. A prepackaged cloth that contains CHG. Cleaning your skin with CHG may help lower the risk for infection: While you are staying in the intensive care unit of the hospital. If you have a vascular access, such as a central line, to provide short-term or long-term access to your veins. If you have a catheter to drain urine from your bladder. If you are on a ventilator. A ventilator is a machine that helps you breathe by moving air in and out of your lungs. After surgery. What are the risks? Risks of using CHG include: A skin reaction. Hearing loss, if CHG gets in your ears and you have a perforated eardrum. Eye injury, if CHG gets in your eyes and is not rinsed out. The CHG product catching fire. Make sure that you avoid smoking and flames after applying CHG to your skin. Do not use CHG: If you have a chlorhexidine  allergy or have previously reacted to chlorhexidine . On babies younger than 58 months of age. How to use CHG solution Use CHG only  as told by your health care provider, and follow the instructions on the label. Use the full amount of CHG as directed. Usually, this is one bottle. During a shower Follow these steps when using CHG solution during a shower (unless your health care provider gives you different instructions): Start the shower. Use your normal soap and shampoo to wash your face and hair. Turn off the shower or move out of the shower stream. Pour the CHG onto a clean washcloth. Do not use any type of brush or rough-edged sponge. Starting at your neck, lather your body down to your toes. Make sure you follow these instructions: If you will be having surgery, pay special attention to the part of your body where you will be having surgery. Scrub this area for at least 1 minute. Do not use CHG on your head or face. If the solution gets into your ears or eyes, rinse them well with water . Avoid your genital area. Avoid any areas of skin that have broken skin, cuts, or scrapes. Scrub your back and under your arms. Make sure to wash skin folds. Let the lather sit on your skin for 1-2 minutes or as long as told by your health care provider. Thoroughly rinse your entire body in the shower. Make sure that all body creases and crevices are rinsed well. Dry off with a clean towel. Do not put any substances on your body afterward--such as powder, lotion, or perfume--unless you are told to do so by your health care provider. Only use lotions that are recommended by the manufacturer. Put on clean clothes or pajamas. If it is the night before your surgery, sleep in clean sheets.  During a sponge bath Follow these steps when using CHG solution during a sponge bath (unless your health care provider gives you different instructions): Use your normal soap and shampoo to wash your face and hair. Pour the CHG onto a clean washcloth. Starting at your neck, lather your body down to your toes. Make sure you follow these instructions: If  you will be having surgery, pay special attention to the part of your body where you will be having surgery. Scrub this area  for at least 1 minute. Do not use CHG on your head or face. If the solution gets into your ears or eyes, rinse them well with water . Avoid your genital area. Avoid any areas of skin that have broken skin, cuts, or scrapes. Scrub your back and under your arms. Make sure to wash skin folds. Let the lather sit on your skin for 1-2 minutes or as long as told by your health care provider. Using a different clean, wet washcloth, thoroughly rinse your entire body. Make sure that all body creases and crevices are rinsed well. Dry off with a clean towel. Do not put any substances on your body afterward--such as powder, lotion, or perfume--unless you are told to do so by your health care provider. Only use lotions that are recommended by the manufacturer. Put on clean clothes or pajamas. If it is the night before your surgery, sleep in clean sheets. How to use CHG prepackaged cloths Only use CHG cloths as told by your health care provider, and follow the instructions on the label. Use the CHG cloth on clean, dry skin. Do not use the CHG cloth on your head or face unless your health care provider tells you to. When washing with the CHG cloth: Avoid your genital area. Avoid any areas of skin that have broken skin, cuts, or scrapes. Before surgery Follow these steps when using a CHG cloth to clean before surgery (unless your health care provider gives you different instructions): Using the CHG cloth, vigorously scrub the part of your body where you will be having surgery. Scrub using a back-and-forth motion for 3 minutes. The area on your body should be completely wet with CHG when you are done scrubbing. Do not rinse. Discard the cloth and let the area air-dry. Do not put any substances on the area afterward, such as powder, lotion, or perfume. Put on clean clothes or pajamas. If it  is the night before your surgery, sleep in clean sheets.  For general bathing Follow these steps when using CHG cloths for general bathing (unless your health care provider gives you different instructions). Use a separate CHG cloth for each area of your body. Make sure you wash between any folds of skin and between your fingers and toes. Wash your body in the following order, switching to a new cloth after each step: The front of your neck, shoulders, and chest. Both of your arms, under your arms, and your hands. Your stomach and groin area, avoiding the genitals. Your right leg and foot. Your left leg and foot. The back of your neck, your back, and your buttocks. Do not rinse. Discard the cloth and let the area air-dry. Do not put any substances on your body afterward--such as powder, lotion, or perfume--unless you are told to do so by your health care provider. Only use lotions that are recommended by the manufacturer. Put on clean clothes or pajamas. Contact a health care provider if: Your skin gets irritated after scrubbing. You have questions about using your solution or cloth. You swallow any chlorhexidine . Call your local poison control center (424-228-1323 in the [place] Get help right away if: Your eyes itch badly, or they become very red or swollen. Your skin itches badly and is red or swollen. Your hearing changes. You have trouble seeing. You have swelling or tingling in your mouth or throat. You have trouble breathing. These symptoms may represent a serious problem that is an emergency. Do not wait to see if the symptoms  will go away. Get medical help right away. Call your local emergency services (911 in the [place] Do not drive yourself to the hospital. Summary Chlorhexidine  gluconate (CHG) is a germ-killing (antiseptic) solution that is used to clean the skin. Cleaning your skin with CHG may help to lower your risk for infection. You may be given CHG to use for bathing. It  may be in a bottle or in a prepackaged cloth to use on your skin. Carefully follow your health care provider's instructions and the instructions on the product label. Do not use CHG if you have a chlorhexidine  allergy. Contact your health care provider if your skin gets irritated after scrubbing. This information is not intended to replace advice given to you by your health care provider. Make sure you discuss any questions you have with your health care provider. Document Revised: 11/21/2021 Document Reviewed: 10/04/2020 Elsevier Patient Education  2023 [employer].

## 2023-08-21 ENCOUNTER — Encounter (HOSPITAL_COMMUNITY): Payer: Self-pay

## 2023-08-21 ENCOUNTER — Encounter (HOSPITAL_COMMUNITY)
Admission: RE | Admit: 2023-08-21 | Discharge: 2023-08-21 | Disposition: A | Discharge status: Home / Self Care | Payer: Medicare Other | Source: Ambulatory Visit | Attending: Surgery

## 2023-08-21 VITALS — BP 140/85 | HR 67 | Temp 97.6°F | Resp 18 | Ht 64.0 in | Wt 151.0 lb

## 2023-08-21 DIAGNOSIS — Z01810 Encounter for preprocedural cardiovascular examination: Secondary | ICD-10-CM | POA: Diagnosis not present

## 2023-08-21 DIAGNOSIS — F17210 Nicotine dependence, cigarettes, uncomplicated: Secondary | ICD-10-CM | POA: Insufficient documentation

## 2023-08-21 DIAGNOSIS — F17200 Nicotine dependence, unspecified, uncomplicated: Secondary | ICD-10-CM

## 2023-08-21 NOTE — Anesthesia Preprocedure Evaluation (Addendum)
 Anesthesia Evaluation  Patient identified by MRN, date of birth, ID band Patient awake    Reviewed: Allergy & Precautions, NPO status , Patient's Chart, lab work & pertinent test results, reviewed documented beta blocker date and time   History of Anesthesia Complications (+) PONV and history of anesthetic complications  Airway Mallampati: II  TM Distance: >3 FB Neck ROM: Full    Dental no notable dental hx. (+) Dental Advisory Given, Teeth Intact   Pulmonary Current Smoker and Patient abstained from smoking.   Pulmonary exam normal breath sounds clear to auscultation       Cardiovascular (-) hypertensionNormal cardiovascular exam+ dysrhythmias (tachycardia, on propranolol )  Rhythm:Regular Rate:Normal     Neuro/Psych  Headaches PSYCHIATRIC DISORDERS Anxiety Depression     Neuromuscular disease    GI/Hepatic Neg liver ROS,GERD  Controlled,,  Endo/Other  Hypothyroidism Hyperthyroidism   Renal/GU negative Renal ROS  negative genitourinary   Musculoskeletal  (+) Arthritis , Osteoarthritis,  Fibromyalgia -  Abdominal   Peds negative pediatric ROS (+)  Hematology negative hematology ROS (+)   Anesthesia Other Findings   Reproductive/Obstetrics negative OB ROS                             Anesthesia Physical Anesthesia Plan  ASA: 2  Anesthesia Plan: General   Post-op Pain Management: Dilaudid  IV   Induction: Intravenous  PONV Risk Score and Plan: 4 or greater and Ondansetron , Dexamethasone  and Midazolam   Airway Management Planned: Oral ETT  Additional Equipment: None  Intra-op Plan:   Post-operative Plan: Extubation in OR  Informed Consent: I have reviewed the patients History and Physical, chart, labs and discussed the procedure including the risks, benefits and alternatives for the proposed anesthesia with the patient or authorized representative who has indicated his/her  understanding and acceptance.     Dental advisory given  Plan Discussed with: CRNA  Anesthesia Plan Comments:         Anesthesia Quick Evaluation

## 2023-08-22 ENCOUNTER — Encounter (HOSPITAL_COMMUNITY): Payer: Self-pay | Admitting: Surgery

## 2023-08-22 ENCOUNTER — Ambulatory Visit (HOSPITAL_BASED_OUTPATIENT_CLINIC_OR_DEPARTMENT_OTHER): Payer: Medicare Other | Admitting: Anesthesiology

## 2023-08-22 ENCOUNTER — Other Ambulatory Visit: Payer: Self-pay

## 2023-08-22 ENCOUNTER — Ambulatory Visit (HOSPITAL_COMMUNITY): Payer: Medicare Other | Admitting: Anesthesiology

## 2023-08-22 ENCOUNTER — Encounter (HOSPITAL_COMMUNITY)
Admission: RE | Disposition: A | Discharge status: Home / Self Care | Payer: Self-pay | Source: Home / Self Care | Attending: Surgery

## 2023-08-22 ENCOUNTER — Ambulatory Visit (HOSPITAL_COMMUNITY)
Admission: RE | Admit: 2023-08-22 | Discharge: 2023-08-22 | Disposition: A | Discharge status: Home / Self Care | Payer: Medicare Other | Attending: Surgery | Admitting: Surgery

## 2023-08-22 DIAGNOSIS — K219 Gastro-esophageal reflux disease without esophagitis: Secondary | ICD-10-CM | POA: Insufficient documentation

## 2023-08-22 DIAGNOSIS — K429 Umbilical hernia without obstruction or gangrene: Secondary | ICD-10-CM

## 2023-08-22 DIAGNOSIS — M797 Fibromyalgia: Secondary | ICD-10-CM | POA: Insufficient documentation

## 2023-08-22 DIAGNOSIS — F17200 Nicotine dependence, unspecified, uncomplicated: Secondary | ICD-10-CM | POA: Diagnosis not present

## 2023-08-22 DIAGNOSIS — K439 Ventral hernia without obstruction or gangrene: Secondary | ICD-10-CM

## 2023-08-22 SURGERY — REPAIR, HERNIA, UMBILICAL, ROBOT-ASSISTED
Anesthesia: General | Site: Abdomen

## 2023-08-22 MED ORDER — OXYCODONE HCL 5 MG PO TABS: 5.0000 mg | ORAL_TABLET | Freq: Four times a day (QID) | ORAL | 0 refills | Status: DC | PRN

## 2023-08-22 MED ORDER — ACETAMINOPHEN 500 MG PO TABS
ORAL_TABLET | ORAL | Status: AC
Start: 2023-08-22 — End: ?
  Filled 2023-08-22: qty 2

## 2023-08-22 MED ORDER — DEXAMETHASONE SODIUM PHOSPHATE 10 MG/ML IJ SOLN
INTRAMUSCULAR | Status: DC | PRN
  Administered 2023-08-22: 5 mg via INTRAVENOUS

## 2023-08-22 MED ORDER — PROPOFOL 500 MG/50ML IV EMUL
INTRAVENOUS | Status: DC | PRN
  Administered 2023-08-22: 25 ug/kg/min via INTRAVENOUS

## 2023-08-22 MED ORDER — LIDOCAINE HCL (PF) 2 % IJ SOLN
INTRAMUSCULAR | Status: DC | PRN
  Administered 2023-08-22: 100 mg via INTRADERMAL

## 2023-08-22 MED ORDER — DEXMEDETOMIDINE HCL IN NACL 80 MCG/20ML IV SOLN
INTRAVENOUS | Status: DC | PRN
  Administered 2023-08-22 (×2): 8 ug via INTRAVENOUS

## 2023-08-22 MED ORDER — SUGAMMADEX SODIUM 200 MG/2ML IV SOLN
INTRAVENOUS | Status: DC | PRN
  Administered 2023-08-22: 200 mg via INTRAVENOUS

## 2023-08-22 MED ORDER — ONDANSETRON HCL 4 MG/2ML IJ SOLN
INTRAMUSCULAR | Status: DC | PRN
  Administered 2023-08-22: 4 mg via INTRAVENOUS

## 2023-08-22 MED ORDER — FENTANYL CITRATE (PF) 250 MCG/5ML IJ SOLN
INTRAMUSCULAR | Status: AC
  Filled 2023-08-22: qty 5

## 2023-08-22 MED ORDER — PROPOFOL 10 MG/ML IV BOLUS
INTRAVENOUS | Status: DC | PRN
  Administered 2023-08-22: 200 mg via INTRAVENOUS

## 2023-08-22 MED ORDER — BUPIVACAINE HCL (PF) 0.5 % IJ SOLN
INTRAMUSCULAR | Status: DC | PRN
  Administered 2023-08-22: 30 mL

## 2023-08-22 MED ORDER — ACETAMINOPHEN 160 MG/5ML PO SOLN: 960.0000 mg | Freq: Once | ORAL | Status: AC

## 2023-08-22 MED ORDER — LACTATED RINGERS IV SOLN: INTRAVENOUS | Status: DC

## 2023-08-22 MED ORDER — DEXAMETHASONE SODIUM PHOSPHATE 10 MG/ML IJ SOLN
INTRAMUSCULAR | Status: AC
Start: 2023-08-22 — End: ?
  Filled 2023-08-22: qty 1

## 2023-08-22 MED ORDER — CEFAZOLIN SODIUM-DEXTROSE 2-4 GM/100ML-% IV SOLN
2.0000 g | INTRAVENOUS | Status: AC
  Administered 2023-08-22: 2 g via INTRAVENOUS
  Filled 2023-08-22: qty 100

## 2023-08-22 MED ORDER — PHENYLEPHRINE HCL-NACL 20-0.9 MG/250ML-% IV SOLN
INTRAVENOUS | Status: AC
  Filled 2023-08-22: qty 250

## 2023-08-22 MED ORDER — HYDROMORPHONE HCL 1 MG/ML IJ SOLN
0.2500 mg | INTRAMUSCULAR | Status: DC | PRN
  Administered 2023-08-22 (×3): 0.5 mg via INTRAVENOUS
  Filled 2023-08-22 (×3): qty 0.5

## 2023-08-22 MED ORDER — SCOPOLAMINE 1 MG/3DAYS TD PT72: 1.0000 | MEDICATED_PATCH | TRANSDERMAL | Status: DC

## 2023-08-22 MED ORDER — ONDANSETRON HCL 4 MG/2ML IJ SOLN
INTRAMUSCULAR | Status: AC
  Filled 2023-08-22: qty 2

## 2023-08-22 MED ORDER — MIDAZOLAM HCL 2 MG/2ML IJ SOLN
INTRAMUSCULAR | Status: AC
Start: 2023-08-22 — End: ?
  Filled 2023-08-22: qty 2

## 2023-08-22 MED ORDER — PROPOFOL 500 MG/50ML IV EMUL
INTRAVENOUS | Status: AC
  Filled 2023-08-22: qty 50

## 2023-08-22 MED ORDER — CHLORHEXIDINE GLUCONATE 0.12 % MT SOLN
15.0000 mL | Freq: Once | OROMUCOSAL | Status: AC
  Administered 2023-08-22: 15 mL via OROMUCOSAL
  Filled 2023-08-22: qty 15

## 2023-08-22 MED ORDER — PROPOFOL 10 MG/ML IV BOLUS
INTRAVENOUS | Status: AC
  Filled 2023-08-22: qty 20

## 2023-08-22 MED ORDER — SCOPOLAMINE 1 MG/3DAYS TD PT72
MEDICATED_PATCH | TRANSDERMAL | Status: DC | PRN
  Administered 2023-08-22: 1 via TRANSDERMAL

## 2023-08-22 MED ORDER — ONDANSETRON HCL 4 MG/2ML IJ SOLN: 4.0000 mg | Freq: Once | INTRAMUSCULAR | Status: DC | PRN

## 2023-08-22 MED ORDER — SCOPOLAMINE 1 MG/3DAYS TD PT72
MEDICATED_PATCH | TRANSDERMAL | Status: AC
  Filled 2023-08-22: qty 1

## 2023-08-22 MED ORDER — CHLORHEXIDINE GLUCONATE CLOTH 2 % EX PADS
6.0000 | MEDICATED_PAD | Freq: Once | CUTANEOUS | Status: AC
  Administered 2023-08-22: 6 via TOPICAL

## 2023-08-22 MED ORDER — CHLORHEXIDINE GLUCONATE CLOTH 2 % EX PADS
6.0000 | MEDICATED_PAD | Freq: Once | CUTANEOUS | Status: AC
Start: 2023-08-22 — End: 2023-08-22
  Administered 2023-08-22: 6 via TOPICAL

## 2023-08-22 MED ORDER — DEXMEDETOMIDINE HCL IN NACL 80 MCG/20ML IV SOLN
INTRAVENOUS | Status: AC
  Filled 2023-08-22: qty 20

## 2023-08-22 MED ORDER — LIDOCAINE HCL (PF) 2 % IJ SOLN
INTRAMUSCULAR | Status: AC
  Filled 2023-08-22: qty 5

## 2023-08-22 MED ORDER — KETOROLAC TROMETHAMINE 30 MG/ML IJ SOLN
INTRAMUSCULAR | Status: AC
  Filled 2023-08-22: qty 1

## 2023-08-22 MED ORDER — FENTANYL CITRATE (PF) 250 MCG/5ML IJ SOLN
INTRAMUSCULAR | Status: DC | PRN
  Administered 2023-08-22 (×2): 50 ug via INTRAVENOUS

## 2023-08-22 MED ORDER — ROCURONIUM BROMIDE 10 MG/ML (PF) SYRINGE
PREFILLED_SYRINGE | INTRAVENOUS | Status: DC | PRN
  Administered 2023-08-22: 60 mg via INTRAVENOUS
  Administered 2023-08-22: 10 mg via INTRAVENOUS

## 2023-08-22 MED ORDER — ACETAMINOPHEN 500 MG PO TABS: 1000.0000 mg | ORAL_TABLET | Freq: Four times a day (QID) | ORAL | 0 refills | Status: AC

## 2023-08-22 MED ORDER — 0.9 % SODIUM CHLORIDE (POUR BTL) OPTIME
TOPICAL | Status: DC | PRN
  Administered 2023-08-22: 1000 mL

## 2023-08-22 MED ORDER — ROCURONIUM BROMIDE 10 MG/ML (PF) SYRINGE
PREFILLED_SYRINGE | INTRAVENOUS | Status: AC
  Filled 2023-08-22: qty 10

## 2023-08-22 MED ORDER — MIDAZOLAM HCL 2 MG/2ML IJ SOLN
INTRAMUSCULAR | Status: DC | PRN
  Administered 2023-08-22: 2 mg via INTRAVENOUS

## 2023-08-22 MED ORDER — PHENYLEPHRINE 80 MCG/ML (10ML) SYRINGE FOR IV PUSH (FOR BLOOD PRESSURE SUPPORT)
PREFILLED_SYRINGE | INTRAVENOUS | Status: AC
  Filled 2023-08-22: qty 10

## 2023-08-22 MED ORDER — OXYCODONE HCL 5 MG/5ML PO SOLN: 5.0000 mg | Freq: Once | ORAL | Status: AC | PRN

## 2023-08-22 MED ORDER — PHENYLEPHRINE 80 MCG/ML (10ML) SYRINGE FOR IV PUSH (FOR BLOOD PRESSURE SUPPORT)
PREFILLED_SYRINGE | INTRAVENOUS | Status: DC | PRN
  Administered 2023-08-22: 80 ug via INTRAVENOUS

## 2023-08-22 MED ORDER — OXYCODONE HCL 5 MG PO TABS
5.0000 mg | ORAL_TABLET | Freq: Once | ORAL | Status: AC | PRN
  Administered 2023-08-22: 5 mg via ORAL
  Filled 2023-08-22: qty 1

## 2023-08-22 MED ORDER — KETOROLAC TROMETHAMINE 30 MG/ML IJ SOLN
INTRAMUSCULAR | Status: DC | PRN
  Administered 2023-08-22: 30 mg via INTRAVENOUS

## 2023-08-22 MED ORDER — DOCUSATE SODIUM 100 MG PO CAPS: 100.0000 mg | ORAL_CAPSULE | Freq: Two times a day (BID) | ORAL | 2 refills | Status: AC

## 2023-08-22 MED ORDER — ACETAMINOPHEN 500 MG PO TABS
1000.0000 mg | ORAL_TABLET | Freq: Once | ORAL | Status: AC
  Administered 2023-08-22: 1000 mg via ORAL

## 2023-08-22 MED ORDER — BUPIVACAINE HCL (PF) 0.5 % IJ SOLN
INTRAMUSCULAR | Status: AC
  Filled 2023-08-22: qty 30

## 2023-08-22 MED ORDER — ORAL CARE MOUTH RINSE: 15.0000 mL | Freq: Once | OROMUCOSAL | Status: AC

## 2023-08-22 SURGICAL SUPPLY — 50 items
BLADE SURG 15 STRL LF DISP TIS (BLADE) ×1 IMPLANT
CHLORAPREP W/TINT 26 (MISCELLANEOUS) ×1 IMPLANT
COVER LIGHT HANDLE STERIS (MISCELLANEOUS) ×2 IMPLANT
COVER MAYO STAND XLG (MISCELLANEOUS) ×1 IMPLANT
COVER TIP SHEARS 8 DVNC (MISCELLANEOUS) ×1 IMPLANT
DEFOGGER SCOPE WARMER CLEARIFY (MISCELLANEOUS) ×1 IMPLANT
DERMABOND ADVANCED .7 DNX12 (GAUZE/BANDAGES/DRESSINGS) ×1 IMPLANT
DERMABOND ADVANCED .7 DNX6 (GAUZE/BANDAGES/DRESSINGS) IMPLANT
DRAPE ARM DVNC X/XI (DISPOSABLE) ×3 IMPLANT
DRAPE COLUMN DVNC XI (DISPOSABLE) ×1 IMPLANT
DRIVER NDL MEGA SUTCUT DVNCXI (INSTRUMENTS) ×1 IMPLANT
DRIVER NDLE MEGA SUTCUT DVNCXI (INSTRUMENTS) ×1
DRSG TEGADERM 4X4.75 (GAUZE/BANDAGES/DRESSINGS) ×1 IMPLANT
ELECT REM PT RETURN 9FT ADLT (ELECTROSURGICAL) ×1
ELECTRODE REM PT RTRN 9FT ADLT (ELECTROSURGICAL) ×1 IMPLANT
FORCEPS BPLR 8 MD DVNC XI (FORCEP) ×1 IMPLANT
GAUZE SPONGE 2X2 8PLY STRL LF (GAUZE/BANDAGES/DRESSINGS) ×2 IMPLANT
GLOVE BIOGEL PI IND STRL 6.5 (GLOVE) ×2 IMPLANT
GLOVE BIOGEL PI IND STRL 7.0 (GLOVE) ×2 IMPLANT
GLOVE SURG SS PI 6.5 STRL IVOR (GLOVE) ×2 IMPLANT
GOWN STRL REUS W/TWL LRG LVL3 (GOWN DISPOSABLE) ×3 IMPLANT
KIT TURNOVER KIT A (KITS) ×1 IMPLANT
MANIFOLD NEPTUNE II (INSTRUMENTS) ×1 IMPLANT
MESH VENTRALIGHT ST 4.5IN (Mesh General) IMPLANT
NDL HYPO 21X1.5 SAFETY (NEEDLE) ×1 IMPLANT
NDL INSUFFLATION 14GA 120MM (NEEDLE) ×1 IMPLANT
NEEDLE HYPO 21X1.5 SAFETY (NEEDLE) ×1
NEEDLE INSUFFLATION 14GA 120MM (NEEDLE) ×1
OBTURATOR OPTICAL STND 8 DVNC (TROCAR) ×1
OBTURATOR OPTICALSTD 8 DVNC (TROCAR) ×1 IMPLANT
PACK LAP CHOLE LZT030E (CUSTOM PROCEDURE TRAY) ×1 IMPLANT
PENCIL HANDSWITCHING (ELECTRODE) ×1 IMPLANT
POSITIONER HEAD 8X9X4 ADT (SOFTGOODS) ×1 IMPLANT
SCISSORS MNPLR CVD DVNC XI (INSTRUMENTS) ×1 IMPLANT
SEAL UNIV 5-12 XI (MISCELLANEOUS) ×3 IMPLANT
SET BASIN LINEN APH (SET/KITS/TRAYS/PACK) ×1 IMPLANT
SET TUBE SMOKE EVAC HIGH FLOW (TUBING) ×1 IMPLANT
SUT MNCRL AB 4-0 PS2 18 (SUTURE) ×1 IMPLANT
SUT STRATA 3-0 30 PS-1 (SUTURE) IMPLANT
SUT STRATA 3-0 SH (SUTURE) ×2 IMPLANT
SUT STRATAFIX 0 PDS+ CT-2 23 (SUTURE) ×1
SUT V-LOC 90 ABS 3-0 VLT V-20 (SUTURE) ×2 IMPLANT
SUT V-LOC 90 ABS DVC 3-0 CL (SUTURE) IMPLANT
SUTURE STRATFX 0 PDS+ CT-2 23 (SUTURE) ×1 IMPLANT
SYR 30ML LL (SYRINGE) ×1 IMPLANT
SYS RETRIEVAL 5MM INZII UNIV (BASKET) ×1
SYSTEM RETRIEVL 5MM INZII UNIV (BASKET) IMPLANT
TAPE TRANSPORE STRL 2 31045 (GAUZE/BANDAGES/DRESSINGS) ×1 IMPLANT
TRAY FOLEY W/BAG SLVR 16FR ST (SET/KITS/TRAYS/PACK) ×1 IMPLANT
WATER STERILE IRR 500ML POUR (IV SOLUTION) ×1 IMPLANT

## 2023-08-22 NOTE — Op Note (Signed)
 [facility] Operative Note  08/22/23  Preoperative Diagnosis: Umbilical hernia   Postoperative Diagnosis: Umbilical hernia, Ventral hernia x3   Procedure(s) Performed: Robotic assisted laparoscopic umbilical and ventral hernia repair with mesh, total defect size 8 cm (umbilical hernia 3 cm, ventral hernia 1 cm, ventral hernia 1 cm, ventral hernia 3 cm)   Surgeon: Lidia Reels, DO   Assistants: Priscella Brooms, MS3    Anesthesia: General endotracheal   Anesthesiologist: Dr. Fredric Jean   Specimens: None   Estimated Blood Loss: Minimal   Blood Replacement: None    Complications: None   Wound Class: Clean   Operative Indications: Patient is a 53 year old female who presents for umbilical hernia repair.  She first noted she had a hernia 2 years ago, and since that time, she has felt the area increase in size and start to cause her more pain.  She is agreeable to surgery at this time.  All risks and benefits of performing this procedure were discussed with the patient including pain, infection, bleeding, damage to the surrounding structures, need for more procedures or surgery and hernia recurrence. The patient voiced understanding of the procedure, all questions were sought and answered, and consent was obtained.  Findings: Umbilical hernia and ventral hernia x3; Total defect size 8 cm (umbilical hernia 3 cm, ventral hernia 1 cm, ventral hernia 1 cm, ventral hernia 3 cm) Tension free repair achieved with Ventralight 4.5 inch mesh and suture Adequate hemostasis    Procedure: The patient was brought to the operating room and general anesthesia was induced. A time-out was completed verifying correct patient, procedure, site, positioning, and implant(s) and/or special equipment prior to beginning this procedure. Antibiotics were administered prior to making the incision. SCDs placed. The anterior abdominal wall was prepped and draped in the standard sterile fashion.    A stab incision was made at Palmer's point. A towel clip was used to elevate the abdominal wall. A Veress needle placed and the saline drop test was performed. The abdomen was insufflated to 15cm without issues. 8 mm port was then placed at the Palmer's point incision using the optiview technique.  No injuries were noted from insufflation and port insertion.  2 additional lateral ports on the left side were placed using the optiview ports, placing the 8mm central camera port and an additional 8mm port on in the left lower quadrant with adequate space between. The Latty robot then docked into place.  The patient was noted to have an umbilical hernia in addition to 3 ventral hernias. The hernia contents were noted and reduced with combination of blunt, sharp dissection with scissors and fenestrated forceps.  Hemostasis achieved throughout this portion.  Once all hernia contents reduced, there was noted to be a 3 cm umbilical hernia defect, two 1 cm ventral hernias and a 3 cm ventral hernia.  Any fat overlying the peritoneal layer was taken down creating flaps to clear the space for adequate fixation of the mesh.   The Ventralight 4.5 inch mesh and Stratafix and V-Loc sutures were placed in the abdominal cavity under direct visualization.  A transfacial suture with 0 stratafix used to primarily close the defects under minimal tension. The stratafix was then passed through the center of the mesh (coated side out) to secured the mesh to the abdominal wall centered over the defect.  The mesh was then circumferentially sutured into the anterior abdominal wall using 3-0 V-Loc x3.  Any bleeding noted during this portion was no longer actively bleeding  by end of securing mesh and tightening the suture.    The robot was undocked.  The abdomen was then de-sufflated and the ports were removed.  All skin incisions closed with subcuticular 4-0 Monocryl and dermabond. Final inspection revealed acceptable hemostasis.   All  counts were correct at the end of the case. The patient was awakened from anesthesia and extubated without complication.  The patient went to the PACU in stable condition.   Lidia Reels, DO [facility] [address], [place] [address] [phone] (office)

## 2023-08-22 NOTE — Transfer of Care (Signed)
 Immediate Anesthesia Transfer of Care Note  Patient: [name] [name]  Procedure(s) Performed: XI ROBOT ASSISTED UMBILICAL HERNIA REPAIR W/ MESH AND VENTRAL HERNIA REPAIR X 3  Patient Location: PACU  Anesthesia Type:General  Level of Consciousness: drowsy  Airway & Oxygen  Therapy: Patient Spontanous Breathing and Patient connected to face mask oxygen   Post-op Assessment: Report given to RN and Post -op Vital signs reviewed and stable  Post vital signs: Reviewed and stable  Last Vitals:  Vitals Value Taken Time  BP 128/83 08/22/23 0945  Temp    Pulse 71 08/22/23 0947  Resp 18 08/22/23 0947  SpO2 96 % 08/22/23 0947  Vitals shown include unfiled device data.  Last Pain:  Vitals:   08/22/23 0653  PainSc: 5       Patients Stated Pain Goal: 8 (08/22/23 4401)  Complications: No notable events documented.

## 2023-08-22 NOTE — Anesthesia Postprocedure Evaluation (Signed)
 Anesthesia Post Note  Patient: [name] [name]  Procedure(s) Performed: XI ROBOT ASSISTED UMBILICAL HERNIA REPAIR W/ MESH AND VENTRAL HERNIA REPAIR X 3 (Abdomen)  Patient location during evaluation: PACU Anesthesia Type: General Level of consciousness: awake and alert Pain management: pain level controlled Vital Signs Assessment: post-procedure vital signs reviewed and stable Respiratory status: spontaneous breathing, nonlabored ventilation, respiratory function stable and patient connected to nasal cannula oxygen  Cardiovascular status: blood pressure returned to baseline and stable Postop Assessment: no apparent nausea or vomiting Anesthetic complications: no   There were no known notable events for this encounter.   Last Vitals:  Vitals:   08/22/23 1045 08/22/23 1059  BP: 111/82 (!) 126/90  Pulse: 77 80  Resp: 14 16  Temp:  (!) 36.4 C  SpO2: 94% 98%    Last Pain:  Vitals:   08/22/23 1059  TempSrc: Oral  PainSc: 7                  Donnalynn Wheeless L Meilin Brosh

## 2023-08-22 NOTE — Progress Notes (Signed)
 [facility]  Spoke with the patient's husband in the consultation room.  I explained that she tolerated the procedure without difficulty.  She has dissolvable stitches under the skin with overlying skin glue.  This will flake off in 10 to 14 days.  I discharged her home with a prescription for narcotic pain medication that they should take as needed for pain.  I also want her taking scheduled Tylenol.  If they take the narcotic pain medication, they should take a stool softener as well.  The patient will follow-up with me in 2 weeks.  All questions were answered to his expressed satisfaction.  Theophilus Kinds, DO [facility] [address] [place] [place], [place] [address] [phone] (office)

## 2023-08-22 NOTE — Discharge Instructions (Signed)
 Ambulatory Surgery Discharge Instructions  General Anesthesia or Sedation Do not drive or operate heavy machinery for 24 hours.  Do not consume alcohol, tranquilizers, sleeping medications, or any non-prescribed medications for 24 hours. Do not make important decisions or sign any important papers in the next 24 hours. You should have someone with you tonight at home.  Activity  You are advised to go directly home from the hospital.  Restrict your activities and rest for a day.  Resume light activity tomorrow. No heavy lifting over 10 lbs or strenuous exercise.  Fluids and Diet Begin with clear liquids, bouillon, dry toast, soda crackers.  If not nauseated, you may go to a regular diet when you desire.  Greasy and spicy foods are not advised.  Medications  If you have not had a bowel movement in 24 hours, take 2 tablespoons over the counter Milk of mag.             You May resume your blood thinners tomorrow (Aspirin, coumadin, or other).  You are being discharged with prescriptions for Opioid/Narcotic Medications: There are some specific considerations for these medications that you should know. Opioid Meds have risks & benefits. Addiction to these meds is always a concern with prolonged use Take medication only as directed Do not drive while taking narcotic pain medication Do not crush tablets or capsules Do not use a different container than medication was dispensed in Lock the container of medication in a cool, dry place out of reach of children and pets. Opioid medication can cause addiction Do not share with anyone else (this is a felony) Do not store medications for future use. Dispose of them properly.     Disposal:  Find a [employer] household drug take back site near you.  If you can't get to a drug take back site, use the recipe below as a last resort to dispose of expired, unused or unwanted drugs. Disposal  (Do not dispose chemotherapy drugs this way, talk to your  prescribing doctor instead.) Step 1: Mix drugs (do not crush) with dirt, kitty litter, or used coffee grounds and add a small amount of water to dissolve any solid medications. Step 2: Seal drugs in plastic bag. Step 3: Place plastic bag in trash. Step 4: Take prescription container and scratch out personal information, then recycle or throw away.  Operative Site  You have a liquid bandage over your incisions, this will begin to flake off in about a week. Ok to English as a second language teacher. Keep wound clean and dry. No baths or swimming. No lifting more than 10 pounds.  Contact Information: If you have questions or concerns, please call our office, [phone], Monday- Thursday 8AM-5PM and Friday 8AM-12Noon.  If it is after hours or on the weekend, please call [facility], [phone], and ask to speak to the surgeon on call for Dr. Robyne Peers at [facility].   SPECIFIC COMPLICATIONS TO WATCH FOR: Inability to urinate Fever over 101? F by mouth Nausea and vomiting lasting longer than 24 hours. Pain not relieved by medication ordered Swelling around the operative site Increased redness, warmth, hardness, around operative area Numbness, tingling, or cold fingers or toes Blood -soaked dressing, (small amounts of oozing may be normal) Increasing and progressive drainage from surgical area or exam site

## 2023-08-22 NOTE — Anesthesia Procedure Notes (Signed)
 Procedure Name: Intubation Date/Time: 08/22/2023 7:43 AM  Performed by: Sherwin Donate, CRNAPre-anesthesia Checklist: Patient identified, Emergency Drugs available, Suction available and Patient being monitored Patient Re-evaluated:Patient Re-evaluated prior to induction Oxygen  Delivery Method: Circle system utilized Preoxygenation: Pre-oxygenation with 100% oxygen  Induction Type: IV induction Ventilation: Mask ventilation without difficulty and Oral airway inserted - appropriate to patient size Laryngoscope Size: Miller and 3 Grade View: Grade II Tube type: Oral Tube size: 7.5 mm Number of attempts: 1 Airway Equipment and Method: Stylet and Oral airway Placement Confirmation: ETT inserted through vocal cords under direct vision, positive ETCO2 and breath sounds checked- equal and bilateral Secured at: 22 cm Tube secured with: Tape Dental Injury: Teeth and Oropharynx as per pre-operative assessment

## 2023-08-22 NOTE — Interval H&P Note (Signed)
 History and Physical Interval Note:  08/22/2023 7:26 AM  [name] [name]  has presented today for surgery, with the diagnosis of UMBILICAL HERNIA, 2 CM.  The various methods of treatment have been discussed with the patient and family. After consideration of risks, benefits and other options for treatment, the patient has consented to  Procedure(s): XI ROBOT ASSISTED UMBILICAL HERNIA REPAIR W/ MESH (N/A) as a surgical intervention.  The patient's history has been reviewed, patient examined, no change in status, stable for surgery.  I have reviewed the patient's chart and labs.  Questions were answered to the patient's satisfaction.     Hajira Verhagen A Moira Umholtz

## 2023-08-23 ENCOUNTER — Telehealth (INDEPENDENT_AMBULATORY_CARE_PROVIDER_SITE_OTHER): Payer: Medicare Other | Admitting: *Deleted

## 2023-08-23 DIAGNOSIS — Z09 Encounter for follow-up examination after completed treatment for conditions other than malignant neoplasm: Secondary | ICD-10-CM

## 2023-08-23 MED ORDER — HYDROCODONE-ACETAMINOPHEN 5-325 MG PO TABS: 1.0000 | ORAL_TABLET | Freq: Four times a day (QID) | ORAL | 0 refills | Status: DC | PRN

## 2023-08-23 NOTE — Telephone Encounter (Signed)
Call placed to patient and patient made aware. Verbalized understanding.  

## 2023-08-23 NOTE — Telephone Encounter (Signed)
[facility]  Sent in new prescription for Norco as patient did not tolerate Roxicodone.  Patient will need to change Tylenol dose to 500 mg every 6 hours.  Patient also needs to bring her oxycodone prescription to the pharmacy for disposal.  Please call with any questions or concerns.  Theophilus Kinds, DO [facility] [address] [place] [place], [place] [address] [phone] (office)

## 2023-08-23 NOTE — Telephone Encounter (Signed)
Surgical Date: 08/22/2023 Procedure: XI ROBOT ASSISTED UMBILICAL HERNIA REPAIR W/ MESH AND VENTRAL HERNIA REPAIR X 3   Received call from patient [phone]~ telephone  Patient reports that she was given Oxycodone after surgery. States that she took Oxycodone for pain, but noted that she was not able to sleep due to hyperactivity. Patient reports that she cannot tolerate Oxycodone.   Requested pain management to be changed to Hydrocodone. States that she was prescribed hydrocodone after her last surgery and had no adverse reactions.   Patient requested prescription be sent to [facility].

## 2023-08-27 ENCOUNTER — Other Ambulatory Visit: Payer: Self-pay | Admitting: *Deleted

## 2023-08-27 NOTE — Telephone Encounter (Signed)
Surgical Date: 08/22/2023 Procedure: XI ROBOT ASSISTED UMBILICAL HERNIA REPAIR W/ MESH AND VENTRAL HERNIA REPAIR X 3    Received call from patient [phone]~ telephone   Patient reports that she has been taking Hydrocodone as directed. States that she requires refill. Reports that incisions are healing well, but she continues to have pain.    Patient requested prescription be sent to [facility].

## 2023-08-28 MED ORDER — HYDROCODONE-ACETAMINOPHEN 5-325 MG PO TABS: 1.0000 | ORAL_TABLET | Freq: Four times a day (QID) | ORAL | 0 refills | Status: DC | PRN

## 2023-08-31 DIAGNOSIS — Z5189 Encounter for other specified aftercare: Secondary | ICD-10-CM | POA: Diagnosis not present

## 2023-08-31 DIAGNOSIS — Z96652 Presence of left artificial knee joint: Secondary | ICD-10-CM | POA: Diagnosis not present

## 2023-09-05 ENCOUNTER — Encounter: Payer: Self-pay | Admitting: Surgery

## 2023-09-05 ENCOUNTER — Ambulatory Visit: Payer: Medicare Other | Admitting: Surgery

## 2023-09-05 VITALS — BP 114/82 | HR 92 | Temp 98.1°F | Ht 64.0 in | Wt 151.0 lb

## 2023-09-05 DIAGNOSIS — Z09 Encounter for follow-up examination after completed treatment for conditions other than malignant neoplasm: Secondary | ICD-10-CM

## 2023-09-05 NOTE — Progress Notes (Unsigned)
[facility] Note   HPI:  53 y.o. Female presents to clinic for post-op follow-up status post robotic assisted laparoscopic umbilical and ventral hernia repair x 3 with mesh on 1/15.  Patient initially did not tolerate oxycodone so prescription was sent in for Norco.  She was able to tolerate the Norco without any issues.  She has not taken any pain medications since last week.  She only has just some intermittent soreness.  She is tolerating a diet without nausea and vomiting.  She initially had some constipation, but this has since resolved.  She is now having regular bowel movements.  She denies fevers and chills.  She denies issues with her incision sites.  Review of Systems:  All other review of systems: otherwise negative   Vital Signs:  BP 114/82   Pulse 92   Temp 98.1 F (36.7 C) (Oral)   Ht 5\' 4"  (1.626 m)   Wt 151 lb (68.5 kg)   SpO2 94%   BMI 25.92 kg/m    Physical Exam:  Physical Exam Vitals reviewed.  Constitutional:      Appearance: Normal appearance.  Abdominal:     Comments: Abdomen soft, nondistended no percussion tenderness, nontender to palpation; no rigidity, guarding, rebound tenderness; laparoscopic incision sites healing well.  Mild fullness at previous hernia sites, likely seroma of previous hernia sacs  Neurological:     Mental Status: She is alert.     Laboratory studies: None  Imaging:  None  Assessment:  53 y.o. yo Female who presents for follow-up status post robotic assisted laparoscopic umbilical and ventral hernia repair x 3 on 1/15.  Plan:  -Patient has overall been doing well.  She initially had some pain and constipation, but these have both since resolved.  She is now tolerating a diet and moving her bowels without issue -Advised that she needs to continue to take it easy for an additional 2 weeks with no lifting greater than 10 pounds.  When she starts to increase activity at 4 weeks out from surgery, she should start  slow and if the activity hurts, that is her body's way of saying slow down -Advised that the fullness at the hernia sites will soften with time.  This will happen over the next couple of months -Follow up with me as needed  All of the above recommendations were discussed with the patient, and all of patient's questions were answered to her expressed satisfaction.  Theophilus Kinds, DO [facility] [address] [place] [place], [place] [address] [phone] (office)

## 2023-10-08 DIAGNOSIS — R030 Elevated blood-pressure reading, without diagnosis of hypertension: Secondary | ICD-10-CM | POA: Diagnosis not present

## 2023-10-08 DIAGNOSIS — R059 Cough, unspecified: Secondary | ICD-10-CM | POA: Diagnosis not present

## 2023-10-08 DIAGNOSIS — R0981 Nasal congestion: Secondary | ICD-10-CM | POA: Diagnosis not present

## 2023-10-10 DIAGNOSIS — J209 Acute bronchitis, unspecified: Secondary | ICD-10-CM | POA: Diagnosis not present

## 2023-10-11 ENCOUNTER — Encounter (INDEPENDENT_AMBULATORY_CARE_PROVIDER_SITE_OTHER): Payer: Self-pay

## 2023-10-15 ENCOUNTER — Ambulatory Visit: Payer: Medicare Other | Admitting: Gastroenterology

## 2023-11-15 DIAGNOSIS — E559 Vitamin D deficiency, unspecified: Secondary | ICD-10-CM | POA: Diagnosis not present

## 2023-11-15 DIAGNOSIS — R5383 Other fatigue: Secondary | ICD-10-CM | POA: Diagnosis not present

## 2023-11-15 DIAGNOSIS — E7800 Pure hypercholesterolemia, unspecified: Secondary | ICD-10-CM | POA: Diagnosis not present

## 2023-11-15 DIAGNOSIS — E782 Mixed hyperlipidemia: Secondary | ICD-10-CM | POA: Diagnosis not present

## 2023-11-15 DIAGNOSIS — E781 Pure hyperglyceridemia: Secondary | ICD-10-CM | POA: Diagnosis not present

## 2023-11-15 DIAGNOSIS — E039 Hypothyroidism, unspecified: Secondary | ICD-10-CM | POA: Diagnosis not present

## 2023-11-22 DIAGNOSIS — R5383 Other fatigue: Secondary | ICD-10-CM | POA: Diagnosis not present

## 2023-11-22 DIAGNOSIS — E039 Hypothyroidism, unspecified: Secondary | ICD-10-CM | POA: Diagnosis not present

## 2023-11-22 DIAGNOSIS — G43919 Migraine, unspecified, intractable, without status migrainosus: Secondary | ICD-10-CM | POA: Diagnosis not present

## 2023-11-22 DIAGNOSIS — Z23 Encounter for immunization: Secondary | ICD-10-CM | POA: Diagnosis not present

## 2023-11-22 DIAGNOSIS — Z720 Tobacco use: Secondary | ICD-10-CM | POA: Diagnosis not present

## 2024-01-03 DIAGNOSIS — E039 Hypothyroidism, unspecified: Secondary | ICD-10-CM | POA: Diagnosis not present

## 2024-02-19 DIAGNOSIS — E039 Hypothyroidism, unspecified: Secondary | ICD-10-CM | POA: Diagnosis not present

## 2024-02-19 DIAGNOSIS — R5383 Other fatigue: Secondary | ICD-10-CM | POA: Diagnosis not present

## 2024-02-20 DIAGNOSIS — R202 Paresthesia of skin: Secondary | ICD-10-CM | POA: Diagnosis not present

## 2024-02-20 DIAGNOSIS — R200 Anesthesia of skin: Secondary | ICD-10-CM | POA: Diagnosis not present

## 2024-02-20 DIAGNOSIS — M5030 Other cervical disc degeneration, unspecified cervical region: Secondary | ICD-10-CM | POA: Diagnosis not present

## 2024-02-20 DIAGNOSIS — M24159 Other articular cartilage disorders, unspecified hip: Secondary | ICD-10-CM | POA: Diagnosis not present

## 2024-02-20 DIAGNOSIS — M25551 Pain in right hip: Secondary | ICD-10-CM | POA: Diagnosis not present

## 2024-02-20 DIAGNOSIS — M25532 Pain in left wrist: Secondary | ICD-10-CM | POA: Diagnosis not present

## 2024-02-20 DIAGNOSIS — M25531 Pain in right wrist: Secondary | ICD-10-CM | POA: Diagnosis not present

## 2024-02-21 ENCOUNTER — Encounter: Payer: Self-pay | Admitting: Neurology

## 2024-02-22 ENCOUNTER — Other Ambulatory Visit: Payer: Self-pay

## 2024-02-22 DIAGNOSIS — R202 Paresthesia of skin: Secondary | ICD-10-CM

## 2024-02-22 DIAGNOSIS — M24151 Other articular cartilage disorders, right hip: Secondary | ICD-10-CM | POA: Diagnosis not present

## 2024-02-22 DIAGNOSIS — M76891 Other specified enthesopathies of right lower limb, excluding foot: Secondary | ICD-10-CM | POA: Diagnosis not present

## 2024-02-22 DIAGNOSIS — M25551 Pain in right hip: Secondary | ICD-10-CM | POA: Diagnosis not present

## 2024-02-22 DIAGNOSIS — M948X5 Other specified disorders of cartilage, thigh: Secondary | ICD-10-CM | POA: Diagnosis not present

## 2024-02-26 DIAGNOSIS — Z1231 Encounter for screening mammogram for malignant neoplasm of breast: Secondary | ICD-10-CM | POA: Diagnosis not present

## 2024-02-26 DIAGNOSIS — M25551 Pain in right hip: Secondary | ICD-10-CM | POA: Diagnosis not present

## 2024-02-26 DIAGNOSIS — M24159 Other articular cartilage disorders, unspecified hip: Secondary | ICD-10-CM | POA: Diagnosis not present

## 2024-02-26 DIAGNOSIS — M7601 Gluteal tendinitis, right hip: Secondary | ICD-10-CM | POA: Diagnosis not present

## 2024-02-29 ENCOUNTER — Ambulatory Visit: Admitting: Neurology

## 2024-02-29 DIAGNOSIS — R202 Paresthesia of skin: Secondary | ICD-10-CM | POA: Diagnosis not present

## 2024-02-29 DIAGNOSIS — G5603 Carpal tunnel syndrome, bilateral upper limbs: Secondary | ICD-10-CM

## 2024-02-29 NOTE — Procedures (Signed)
 [facility]  [address] [place], [address]  [place], [place] [address] Tel: [phone] Fax: [phone] Test Date:  02/29/2024  Patient: [name] [name] DOB: August 09, 1970 Physician: Tonita Blanch, DO  Sex: Female Height: 5' 4 Ref Phys: Heinz Jacobs, FNP  ID#: [id number]   Technician:    History: This is a 53 year old female referred for evaluation of bilateral hand paresthesias and pain.  NCV & EMG Findings: Extensive electrodiagnostic testing of the right upper extremity and additional studies of the left shows:  Bilateral median sensory responses show prolonged latency (R5.8, L7.0 ms) and reduced amplitude (R7.2, L5.8 V).  Bilateral ulnar sensory responses are within normal limits. Bilateral median motor responses show prolonged latency (L5.7, L6.5 ms).  Bilateral ulnar motor responses are within normal limits.   There is no evidence of active or chronic motor axonal loss changes affecting any of the tested muscles.  Motor unit configuration and recruitment pattern is within normal limits.    Impression: Bilateral median neuropathy at or distal to the wrist, consistent with a clinical diagnosis of carpal tunnel syndrome.  Overall, these findings are moderate-to-severe in degree electrically and worse on the left.    ___________________________ Tonita Blanch, DO    Nerve Conduction Studies   Stim Site NR Peak (ms) Norm Peak (ms) O-P Amp (V) Norm O-P Amp  Left Median Anti Sensory (2nd Digit)  32 C  Wrist    *7.0 <3.6 *5.8 >15  Right Median Anti Sensory (2nd Digit)  32 C  Wrist    *5.8 <3.6 *7.2 >15  Left Ulnar Anti Sensory (5th Digit)  32 C  Wrist    2.7 <3.1 30.2 >10  Right Ulnar Anti Sensory (5th Digit)  32 C  Wrist    2.7 <3.1 34.3 >10     Stim Site NR Onset (ms) Norm Onset (ms) O-P Amp (mV) Norm O-P Amp Site1 Site2 Delta-0 (ms) Dist (cm) Vel (m/s) Norm Vel (m/s)  Left Median Motor (Abd Poll Brev)  32 C  Wrist    *6.5 <4.0 6.8 >6 Elbow Wrist 5.3 28.0  53 >50  Elbow    11.8  6.3         Right Median Motor (Abd Poll Brev)  32 C  Wrist    *5.7 <4.0 10.1 >6 Elbow Wrist 5.2 28.0 54 >50  Elbow    10.9  10.1         Left Ulnar Motor (Abd Dig Minimi)  32 C  Wrist    2.1 <3.1 9.3 >7 B Elbow Wrist 3.6 22.0 61 >50  B Elbow    5.7  8.3  A Elbow B Elbow 1.9 10.0 53 >50  A Elbow    7.6  8.2         Right Ulnar Motor (Abd Dig Minimi)  32 C  Wrist    2.3 <3.1 10.0 >7 B Elbow Wrist 3.9 22.0 56 >50  B Elbow    6.2  9.3  A Elbow B Elbow 1.8 10.0 56 >50  A Elbow    8.0  9.2          Electromyography   Side Muscle Ins.Act Fibs Fasc Recrt Amp Dur Poly Activation Comment  Right 1stDorInt Nml Nml Nml Nml Nml Nml Nml Nml N/A  Right Abd Poll Brev Nml Nml Nml Nml Nml Nml Nml Nml N/A  Right PronatorTeres Nml Nml Nml Nml Nml Nml Nml Nml N/A  Right Biceps Nml Nml Nml Nml Nml Nml Nml Nml  N/A  Right Triceps Nml Nml Nml Nml Nml Nml Nml Nml N/A  Right Deltoid Nml Nml Nml Nml Nml Nml Nml Nml N/A  Left 1stDorInt Nml Nml Nml Nml Nml Nml Nml Nml N/A  Left Abd Poll Brev Nml Nml Nml Nml Nml Nml Nml Nml N/A  Left PronatorTeres Nml Nml Nml Nml Nml Nml Nml Nml N/A  Left Biceps Nml Nml Nml Nml Nml Nml Nml Nml N/A  Left Triceps Nml Nml Nml Nml Nml Nml Nml Nml N/A  Left Deltoid Nml Nml Nml Nml Nml Nml Nml Nml N/A      Waveforms:

## 2024-03-05 DIAGNOSIS — R200 Anesthesia of skin: Secondary | ICD-10-CM | POA: Diagnosis not present

## 2024-03-05 DIAGNOSIS — F17210 Nicotine dependence, cigarettes, uncomplicated: Secondary | ICD-10-CM | POA: Diagnosis not present

## 2024-03-05 DIAGNOSIS — G5603 Carpal tunnel syndrome, bilateral upper limbs: Secondary | ICD-10-CM | POA: Diagnosis not present

## 2024-03-10 DIAGNOSIS — M7602 Gluteal tendinitis, left hip: Secondary | ICD-10-CM | POA: Diagnosis not present

## 2024-03-10 DIAGNOSIS — M7601 Gluteal tendinitis, right hip: Secondary | ICD-10-CM | POA: Diagnosis not present

## 2024-03-10 DIAGNOSIS — M6281 Muscle weakness (generalized): Secondary | ICD-10-CM | POA: Diagnosis not present

## 2024-03-10 DIAGNOSIS — M25552 Pain in left hip: Secondary | ICD-10-CM | POA: Diagnosis not present

## 2024-03-13 DIAGNOSIS — M6281 Muscle weakness (generalized): Secondary | ICD-10-CM | POA: Diagnosis not present

## 2024-03-13 DIAGNOSIS — M25552 Pain in left hip: Secondary | ICD-10-CM | POA: Diagnosis not present

## 2024-03-13 DIAGNOSIS — M7602 Gluteal tendinitis, left hip: Secondary | ICD-10-CM | POA: Diagnosis not present

## 2024-03-13 DIAGNOSIS — M7601 Gluteal tendinitis, right hip: Secondary | ICD-10-CM | POA: Diagnosis not present

## 2024-03-17 DIAGNOSIS — M7601 Gluteal tendinitis, right hip: Secondary | ICD-10-CM | POA: Diagnosis not present

## 2024-03-17 DIAGNOSIS — M25552 Pain in left hip: Secondary | ICD-10-CM | POA: Diagnosis not present

## 2024-03-17 DIAGNOSIS — M6281 Muscle weakness (generalized): Secondary | ICD-10-CM | POA: Diagnosis not present

## 2024-03-17 DIAGNOSIS — M7602 Gluteal tendinitis, left hip: Secondary | ICD-10-CM | POA: Diagnosis not present

## 2024-03-19 DIAGNOSIS — M7601 Gluteal tendinitis, right hip: Secondary | ICD-10-CM | POA: Diagnosis not present

## 2024-03-19 DIAGNOSIS — M6281 Muscle weakness (generalized): Secondary | ICD-10-CM | POA: Diagnosis not present

## 2024-03-19 DIAGNOSIS — M7602 Gluteal tendinitis, left hip: Secondary | ICD-10-CM | POA: Diagnosis not present

## 2024-03-19 DIAGNOSIS — M25552 Pain in left hip: Secondary | ICD-10-CM | POA: Diagnosis not present

## 2024-03-26 DIAGNOSIS — M6281 Muscle weakness (generalized): Secondary | ICD-10-CM | POA: Diagnosis not present

## 2024-03-26 DIAGNOSIS — M7601 Gluteal tendinitis, right hip: Secondary | ICD-10-CM | POA: Diagnosis not present

## 2024-03-26 DIAGNOSIS — M25552 Pain in left hip: Secondary | ICD-10-CM | POA: Diagnosis not present

## 2024-03-26 DIAGNOSIS — M7602 Gluteal tendinitis, left hip: Secondary | ICD-10-CM | POA: Diagnosis not present

## 2024-03-27 DIAGNOSIS — M25552 Pain in left hip: Secondary | ICD-10-CM | POA: Diagnosis not present

## 2024-03-27 DIAGNOSIS — M6281 Muscle weakness (generalized): Secondary | ICD-10-CM | POA: Diagnosis not present

## 2024-03-27 DIAGNOSIS — M7601 Gluteal tendinitis, right hip: Secondary | ICD-10-CM | POA: Diagnosis not present

## 2024-03-27 DIAGNOSIS — M7602 Gluteal tendinitis, left hip: Secondary | ICD-10-CM | POA: Diagnosis not present

## 2024-03-31 DIAGNOSIS — M6281 Muscle weakness (generalized): Secondary | ICD-10-CM | POA: Diagnosis not present

## 2024-03-31 DIAGNOSIS — M7601 Gluteal tendinitis, right hip: Secondary | ICD-10-CM | POA: Diagnosis not present

## 2024-03-31 DIAGNOSIS — M25552 Pain in left hip: Secondary | ICD-10-CM | POA: Diagnosis not present

## 2024-03-31 DIAGNOSIS — M7602 Gluteal tendinitis, left hip: Secondary | ICD-10-CM | POA: Diagnosis not present

## 2024-04-02 DIAGNOSIS — M7602 Gluteal tendinitis, left hip: Secondary | ICD-10-CM | POA: Diagnosis not present

## 2024-04-02 DIAGNOSIS — M7601 Gluteal tendinitis, right hip: Secondary | ICD-10-CM | POA: Diagnosis not present

## 2024-04-02 DIAGNOSIS — M25552 Pain in left hip: Secondary | ICD-10-CM | POA: Diagnosis not present

## 2024-04-02 DIAGNOSIS — M6281 Muscle weakness (generalized): Secondary | ICD-10-CM | POA: Diagnosis not present

## 2024-04-09 DIAGNOSIS — M7602 Gluteal tendinitis, left hip: Secondary | ICD-10-CM | POA: Diagnosis not present

## 2024-04-09 DIAGNOSIS — M6281 Muscle weakness (generalized): Secondary | ICD-10-CM | POA: Diagnosis not present

## 2024-04-09 DIAGNOSIS — M7601 Gluteal tendinitis, right hip: Secondary | ICD-10-CM | POA: Diagnosis not present

## 2024-04-09 DIAGNOSIS — M25552 Pain in left hip: Secondary | ICD-10-CM | POA: Diagnosis not present

## 2024-04-16 DIAGNOSIS — M7601 Gluteal tendinitis, right hip: Secondary | ICD-10-CM | POA: Diagnosis not present

## 2024-04-16 DIAGNOSIS — M6281 Muscle weakness (generalized): Secondary | ICD-10-CM | POA: Diagnosis not present

## 2024-04-16 DIAGNOSIS — M7602 Gluteal tendinitis, left hip: Secondary | ICD-10-CM | POA: Diagnosis not present

## 2024-04-16 DIAGNOSIS — M25552 Pain in left hip: Secondary | ICD-10-CM | POA: Diagnosis not present

## 2024-04-21 DIAGNOSIS — M25552 Pain in left hip: Secondary | ICD-10-CM | POA: Diagnosis not present

## 2024-04-21 DIAGNOSIS — M7601 Gluteal tendinitis, right hip: Secondary | ICD-10-CM | POA: Diagnosis not present

## 2024-04-21 DIAGNOSIS — M6281 Muscle weakness (generalized): Secondary | ICD-10-CM | POA: Diagnosis not present

## 2024-04-21 DIAGNOSIS — M7602 Gluteal tendinitis, left hip: Secondary | ICD-10-CM | POA: Diagnosis not present

## 2024-04-23 DIAGNOSIS — M6281 Muscle weakness (generalized): Secondary | ICD-10-CM | POA: Diagnosis not present

## 2024-04-23 DIAGNOSIS — M25552 Pain in left hip: Secondary | ICD-10-CM | POA: Diagnosis not present

## 2024-04-23 DIAGNOSIS — M7602 Gluteal tendinitis, left hip: Secondary | ICD-10-CM | POA: Diagnosis not present

## 2024-04-23 DIAGNOSIS — M7601 Gluteal tendinitis, right hip: Secondary | ICD-10-CM | POA: Diagnosis not present

## 2024-04-24 DIAGNOSIS — M25512 Pain in left shoulder: Secondary | ICD-10-CM | POA: Diagnosis not present

## 2024-04-24 DIAGNOSIS — M7592 Shoulder lesion, unspecified, left shoulder: Secondary | ICD-10-CM | POA: Diagnosis not present

## 2024-04-24 DIAGNOSIS — G8929 Other chronic pain: Secondary | ICD-10-CM | POA: Diagnosis not present

## 2024-04-24 DIAGNOSIS — M75102 Unspecified rotator cuff tear or rupture of left shoulder, not specified as traumatic: Secondary | ICD-10-CM | POA: Diagnosis not present

## 2024-05-05 DIAGNOSIS — M7601 Gluteal tendinitis, right hip: Secondary | ICD-10-CM | POA: Diagnosis not present

## 2024-05-05 DIAGNOSIS — M6281 Muscle weakness (generalized): Secondary | ICD-10-CM | POA: Diagnosis not present

## 2024-05-05 DIAGNOSIS — M25552 Pain in left hip: Secondary | ICD-10-CM | POA: Diagnosis not present

## 2024-05-05 DIAGNOSIS — M7602 Gluteal tendinitis, left hip: Secondary | ICD-10-CM | POA: Diagnosis not present

## 2024-05-07 DIAGNOSIS — M7601 Gluteal tendinitis, right hip: Secondary | ICD-10-CM | POA: Diagnosis not present

## 2024-05-07 DIAGNOSIS — M7602 Gluteal tendinitis, left hip: Secondary | ICD-10-CM | POA: Diagnosis not present

## 2024-05-07 DIAGNOSIS — M25552 Pain in left hip: Secondary | ICD-10-CM | POA: Diagnosis not present

## 2024-05-07 DIAGNOSIS — M6281 Muscle weakness (generalized): Secondary | ICD-10-CM | POA: Diagnosis not present

## 2024-05-20 DIAGNOSIS — E559 Vitamin D deficiency, unspecified: Secondary | ICD-10-CM | POA: Diagnosis not present

## 2024-05-20 DIAGNOSIS — E876 Hypokalemia: Secondary | ICD-10-CM | POA: Diagnosis not present

## 2024-05-20 DIAGNOSIS — E7849 Other hyperlipidemia: Secondary | ICD-10-CM | POA: Diagnosis not present

## 2024-05-20 DIAGNOSIS — E039 Hypothyroidism, unspecified: Secondary | ICD-10-CM | POA: Diagnosis not present

## 2024-05-26 DIAGNOSIS — R5383 Other fatigue: Secondary | ICD-10-CM | POA: Diagnosis not present

## 2024-05-26 DIAGNOSIS — R7989 Other specified abnormal findings of blood chemistry: Secondary | ICD-10-CM | POA: Diagnosis not present

## 2024-05-26 DIAGNOSIS — M797 Fibromyalgia: Secondary | ICD-10-CM | POA: Diagnosis not present

## 2024-05-26 DIAGNOSIS — D559 Anemia due to enzyme disorder, unspecified: Secondary | ICD-10-CM | POA: Diagnosis not present

## 2024-05-26 DIAGNOSIS — G43919 Migraine, unspecified, intractable, without status migrainosus: Secondary | ICD-10-CM | POA: Diagnosis not present

## 2024-05-26 DIAGNOSIS — E559 Vitamin D deficiency, unspecified: Secondary | ICD-10-CM | POA: Diagnosis not present

## 2024-05-26 DIAGNOSIS — E039 Hypothyroidism, unspecified: Secondary | ICD-10-CM | POA: Diagnosis not present

## 2024-05-26 DIAGNOSIS — Z23 Encounter for immunization: Secondary | ICD-10-CM | POA: Diagnosis not present

## 2024-05-26 DIAGNOSIS — Z720 Tobacco use: Secondary | ICD-10-CM | POA: Diagnosis not present

## 2024-06-25 ENCOUNTER — Telehealth: Payer: Self-pay | Admitting: Family Medicine

## 2024-06-25 NOTE — Telephone Encounter (Signed)
 Informed patient would need to request refill from PCP until office visit. Patient verbalized understand.

## 2024-06-25 NOTE — Telephone Encounter (Signed)
 Phone room: Please call her back. Since it has been over a year since last seen, she should request refill from PCP until seen for updated visit at our office

## 2024-06-25 NOTE — Telephone Encounter (Signed)
 Pt understands that although there may be some limitations with this type of visit, we will take all precautions to reduce any security or privacy concerns.  Pt understands that this will be treated like an in office visit and we will file with pt's insurance, and there may be a patient responsible charge related to this service.  Pt also asking for a refill on her Erenumab -aooe (AIMOVIG ) 140 MG/ML SOAJ to  [employer] [facility] DELIV[facility])

## 2024-07-01 ENCOUNTER — Other Ambulatory Visit: Payer: Self-pay

## 2024-07-09 ENCOUNTER — Other Ambulatory Visit: Payer: Self-pay

## 2024-07-09 DIAGNOSIS — G43711 Chronic migraine without aura, intractable, with status migrainosus: Secondary | ICD-10-CM

## 2024-07-09 MED ORDER — AIMOVIG 140 MG/ML ~~LOC~~ SOAJ: 140.0000 mg | SUBCUTANEOUS | 3 refills | Status: DC

## 2024-08-04 NOTE — Progress Notes (Unsigned)
 "  PATIENT: [name] [name] DOB: 01-Jan-1971  REASON FOR VISIT: follow up HISTORY FROM: patient  Virtual Visit via Mychart Video Note  I connected with [name] [name] on 08/05/2024 at  8:30 AM EST by video and verified that I am speaking with the correct person using two identifiers.   I discussed the limitations, risks, security and privacy concerns of performing an evaluation and management service by video and the availability of in person appointments. I also discussed with the patient that there may be a patient responsible charge related to this service. The patient expressed understanding and agreed to proceed.   History of Present Illness:  08/05/2024 ALL (Mychart): [name] returns for follow up for migraines. She continues Amovig, rizatriptan  and ondansetron . She reports migraines remain well managed. She may have a couple of milder headaches toward the end of Amovig cycle. She estimates 1 migraine per month or less. Rizatriptan  has not been as effective. She took sumatriptan  years ago and felt it worked well. Ondansetron  helps with nausea associated with migraine.   10/17/2022 ALL (Mychart): [name] [name] is a 53 y.o. female here today for follow up for migraines. She continues Amovig monthly and rizatriptan  and ondansetron  as needed. She reports doing well. She may have 2 migraines per month, on average. Rizatriptan  works well. She uses ondansetron  for nausea as needed. She is doing well.   07/07/21 ALL: [name] [name] returns for follow up for migraines. She has been doing very well on Amovig. She was receiving through PAP but recently denied. She continues propranolol  for pulse management with Graves. She was started on amitriptyline  for fibromyalgia in 11/2020. She is tolerating it well and feels it works well for joint pain. She has not had very many headaches at all. Maybe 1 per month. Rizatriptan  helps with abortive therapy. She has 4 Amovig injections left and wondering if she needs  to switch to a different CGRP.    07/07/2020 ALL:  [name] [name] is a 53 y.o. female here today for follow up for migraines. She continues Amovig monthly and ondansetron , rizatriptan  as needed for abortive therapy. She is getting Amovig through PAP. She feels that rizatriptan  worked just as well as Nurtec and is cheaper. No migraines over the past three months. She is also taking propranolol  LA 160mg  daily for tachycardia associated with Graves. She is feeling well, today and without complaints.     HISTORY (copied from previous note)   Interval history 07/07/2019:  Doing exceptionally well. Just has a migraine after the injection but pretreating with maxalt  helps. She gets a migraine the next day. We discussed continuing Aimovig , she is doing so well. We discussed acute treatment also pretreating.    Interval history 07/03/2018: She is here for follow up on chronic intractable migraines. Started on Aimovig  and doing extremely well. Baseline was  daily headaches and 15 days a month or migrainous. She returns today after 4 months with excellent progress. She has failed multiple medications for years. The aimovig  worked quickly. She only has a migraine the day she has an injection. Recommend taking maxalt  with the injection. She has NOT HAD ONE MIGRAINE since starting. She has had several mild headaches. Discussed pre-treatment.    HPI:  [name] [name] is a 53 y.o. female here as a new referral from Dr. Atilano for migraines.  Patient has had migraines for many years.  Migraines are unilateral, pulsating pounding throbbing, associated light and sound sensitivity, nausea and vomiting, movement makes it worse.  She is tried and failed multiple medications.  She has daily headaches and 15 days a month or migrainous and can last up to 24 hours and most are moderately severe or severe migraines, sitting in a dark room with no sound and no movement helps, stress and whether or triggers.  No aura.  No  medication overuse.  She does have neck pain.  No other focal neurologic deficits, associated symptoms, inciting events or modifiable factors.   Medications tried: Maxalt , propranolol , Imitrex , Zofran , Flexeril, gabapentin , naproxen, Phenergan , tramadol , topiramate, nortriptyline and amitriptyline    Reviewed notes, labs and imaging from outside physicians, which showed:   Reviewed MRI of the brain that she brought on disc MRI of the head without contrast media which showed T2 white matter changes likely incidental chronic microvascular changes which could be from migraine.  Per report her right millimeter pineal region cyst is stable when compared to January 2018 I do not have the 2018 MRI to compare against.   Personally reviewed images MRI cervical spine and agree with the following: C6-C7 disc bulging and facet hypertrophy with moderate right and mild left foraminal stenosis, C5-C6 disc bulging with mild left foraminal stenosis, no intrinsic or compressive spinal cord lesions overall mild normal for age degenerative changes per my review.   Observations/Objective:  Generalized: Well developed, in no acute distress  Mentation: Alert oriented to time, place, history taking. Follows all commands speech and language fluent   Assessment and Plan:  53 y.o. year old female  has a past medical history of Anxiety, Bulging lumbar disc, Family history of adverse reaction to anesthesia, Fibromyalgia, GERD (gastroesophageal reflux disease), Graves disease, Hyperlipidemia, Hyperthyroidism, Macular degeneration, Migraine, and PONV (postoperative nausea and vomiting). here with    ICD-10-CM   1. Chronic migraine without aura, with intractable migraine, so stated, with status migrainosus  G43.711       Fronia continues to do well. Migraines are well managed on Amovig. Every 30 days. We will switch rizatriptan  to sumatriptan  and continue ondansetron  as needed. Appropriate dosing and possible side effects  reviewed. We will continue current treatment plan. She will continue healthy lifestyle habits. Follow up with me in 1 year.   No orders of the defined types were placed in this encounter.   No orders of the defined types were placed in this encounter.    Follow Up Instructions:  I discussed the assessment and treatment plan with the patient. The patient was provided an opportunity to ask questions and all were answered. The patient agreed with the plan and demonstrated an understanding of the instructions.   The patient was advised to call back or seek an in-person evaluation if the symptoms worsen or if the condition fails to improve as anticipated.  I provided 15 minutes of non-face-to-face time during this encounter. Patient located at their place of residence during Mychart visit. Provider is in the office.    Cheick Suhr, NP  "

## 2024-08-04 NOTE — Patient Instructions (Incomplete)
 Below is our plan:  We will continue Amovig every 30 days. We will switch rizatriptan  to sumatriptan  and continue ondansetron  as needed.   Please make sure you are staying well hydrated. I recommend 50-60 ounces daily. Well balanced diet and regular exercise encouraged. Consistent sleep schedule with 6-8 hours recommended.   Please continue follow up with care team as directed.   Follow up with me in 1 year   You may receive a survey regarding today's visit. I encourage you to leave honest feed back as I do use this information to improve patient care. Thank you for seeing me today!   GENERAL HEADACHE INFORMATION:   Natural supplements: Magnesium Oxide or Magnesium Glycinate 500 mg at bed (up to 800 mg daily) Coenzyme Q10 300 mg in AM Vitamin B2- 200 mg twice a day   Add 1 supplement at a time since even natural supplements can have undesirable side effects. You can sometimes buy supplements cheaper (especially Coenzyme Q10) at www.webmailguide.co.za or at [facility].  Migraine with aura: There is increased risk for stroke in women with migraine with aura and a contraindication for the combined contraceptive pill for use by women who have migraine with aura. The risk for women with migraine without aura is lower. However other risk factors like smoking are far more likely to increase stroke risk than migraine. There is a recommendation for no smoking and for the use of OCPs without estrogen such as progestogen only pills particularly for women with migraine with aura.SABRA People who have migraine headaches with auras may be 3 times more likely to have a stroke caused by a blood clot, compared to migraine patients who don't see auras. Women who take hormone-replacement therapy may be 30 percent more likely to suffer a clot-based stroke than women not taking medication containing estrogen. Other risk factors like smoking and high blood pressure may be  much more important.    Vitamins and herbs that show  potential:   Magnesium: Magnesium (250 mg twice a day or 500 mg at bed) has a relaxant effect on smooth muscles such as blood vessels. Individuals suffering from frequent or daily headache usually have low magnesium levels which can be increase with daily supplementation of 400-750 mg. Three trials found 40-90% average headache reduction  when used as a preventative. Magnesium may help with headaches are aura, the best evidence for magnesium is for migraine with aura is its thought to stop the cortical spreading depression we believe is the pathophysiology of migraine aura.Magnesium also demonstrated the benefit in menstrually related migraine.  Magnesium is part of the messenger system in the serotonin cascade and it is a good muscle relaxant.  It is also useful for constipation which can be a side effect of other medications used to treat migraine. Good sources include nuts, whole grains, and tomatoes. Side Effects: loose stool/diarrhea  Riboflavin (vitamin B 2) 200 mg twice a day. This vitamin assists nerve cells in the production of ATP a principal energy storing molecule.  It is necessary for many chemical reactions in the body.  There have been at least 3 clinical trials of riboflavin using 400 mg per day all of which suggested that migraine frequency can be decreased.  All 3 trials showed significant improvement in over half of migraine sufferers.  The supplement is found in bread, cereal, milk, meat, and poultry.  Most Americans get more riboflavin than the recommended daily allowance, however riboflavin deficiency is not necessary for the supplements to help prevent  headache. Side effects: energizing, green urine   Coenzyme Q10: This is present in almost all cells in the body and is critical component for the conversion of energy.  Recent studies have shown that a nutritional supplement of CoQ10 can reduce the frequency of migraine attacks by improving the energy production of cells as with  riboflavin.  Doses of 150 mg twice a day have been shown to be effective.   Melatonin: Increasing evidence shows correlation between melatonin secretion and headache conditions.  Melatonin supplementation has decreased headache intensity and duration.  It is widely used as a sleep aid.  Sleep is natures way of dealing with migraine.  A dose of 3 mg is recommended to start for headaches including cluster headache. Higher doses up to 15 mg has been reviewed for use in Cluster headache and have been used. The rationale behind using melatonin for cluster is that many theories regarding the cause of Cluster headache center around the disruption of the normal circadian rhythm in the brain.  This helps restore the normal circadian rhythm.   HEADACHE DIET: Foods and beverages which may trigger migraine Note that only 20% of headache patients are food sensitive. You will know if you are food sensitive if you get a headache consistently 20 minutes to 2 hours after eating a certain food. Only cut out a food if it causes headaches, otherwise you might remove foods you enjoy! What matters most for diet is to eat a well balanced healthy diet full of vegetables and low fat protein, and to not miss meals.   Chocolate, other sweets ALL cheeses except cottage and cream cheese Dairy products, yogurt, sour cream, ice cream Liver Meat extracts (Bovril, Marmite, meat tenderizers) Meats or fish which have undergone aging, fermenting, pickling or smoking. These include: Hotdogs,salami,Lox,sausage, mortadellas,smoked salmon, pepperoni, Pickled herring Pods of broad bean (English beans, Chinese pea pods, Italian (fava) beans, lima and navy beans Ripe avocado, ripe banana Yeast extracts or active yeast preparations such as Brewer's or Fleishman's (commercial bakes goods are permitted) Tomato based foods, pizza (lasagna, etc.)   MSG (monosodium glutamate) is disguised as many things; look for these common  aliases: Monopotassium glutamate Autolysed yeast Hydrolysed protein Sodium caseinate flavorings all natural preservatives Nutrasweet   Avoid all other foods that convincingly provoke headaches.   Resources: The Dizzy Bluford Aid Your Headache Diet, migrainestrong.com  https://zamora-andrews.com/   Caffeine and Migraine For patients that have migraine, caffeine intake more than 3 days per week can lead to dependency and increased migraine frequency. I would recommend cutting back on your caffeine intake as best you can. The recommended amount of caffeine is 200-300 mg daily, although migraine patients may experience dependency at even lower doses. While you may notice an increase in headache temporarily, cutting back will be helpful for headaches in the long run. For more information on caffeine and migraine, visit: https://americanmigrainefoundation.org/resource-library/caffeine-and-migraine/   Headache Prevention Strategies:   1. Maintain a headache diary; learn to identify and avoid triggers.  - This can be a simple note where you log when you had a headache, associated symptoms, and medications used - There are several smartphone apps developed to help track migraines: Migraine Buddy, Migraine Monitor, Curelator N1-Headache App   Common triggers include: Emotional triggers: Emotional/Upset family or friends Emotional/Upset occupation Business reversal/success Anticipation anxiety Crisis-serious Post-crisis periodNew job/position   Physical triggers: Vacation Day Weekend Strenuous Exercise High Altitude Location New Move Menstrual Day Physical Illness Oversleep/Not enough sleep Weather changes Light: Photophobia or light sesnitivity  treatment involves a balance between desensitization and reduction in overly strong input. Use dark polarized glasses outside, but not inside. Avoid bright or fluorescent light, but do not dim  environment to the point that going into a normally lit room hurts. Consider FL-41 tint lenses, which reduce the most irritating wavelengths without blocking too much light.  These can be obtained at axonoptics.com or theraspecs.com Foods: see list above.   2. Limit use of acute treatments (over-the-counter medications, triptans, etc.) to no more than 2 days per week or 10 days per month to prevent medication overuse headache (rebound headache).     3. Follow a regular schedule (including weekends and holidays): Don't skip meals. Eat a balanced diet. 8 hours of sleep nightly. Minimize stress. Exercise 30 minutes per day. Being overweight is associated with a 5 times increased risk of chronic migraine. Keep well hydrated and drink 6-8 glasses of water  per day.   4. Initiate non-pharmacologic measures at the earliest onset of your headache. Rest and quiet environment. Relax and reduce stress. Breathe2Relax is a free app that can instruct you on    some simple relaxtion and breathing techniques. Http://Dawnbuse.com is a    free website that provides teaching videos on relaxation.  Also, there are  many apps that   can be downloaded for mindful relaxation.  An app called YOGA NIDRA will help walk you through mindfulness. Another app called Calm can be downloaded to give you a structured mindfulness guide with daily reminders and skill development. Headspace for guided meditation Mindfulness Based Stress Reduction Online Course: www.palousemindfulness.com Cold compresses.   5. Don't wait!! Take the maximum allowable dosage of prescribed medication at the first sign of migraine.   6. Compliance:  Take prescribed medication regularly as directed and at the first sign of a migraine.   7. Communicate:  Call your physician when problems arise, especially if your headaches change, increase in frequency/severity, or become associated with neurological symptoms (weakness, numbness, slurred speech, etc.).  Proceed to emergency room if you experience new or worsening symptoms or symptoms do not resolve, if you have new neurologic symptoms or if headache is severe, or for any concerning symptom.   8. Headache/pain management therapies: Consider various complementary methods, including medication, behavioral therapy, psychological counselling, biofeedback, massage therapy, acupuncture, dry needling, and other modalities.  Such measures may reduce the need for medications. Counseling for pain management, where patients learn to function and ignore/minimize their pain, seems to work very well.   9. Recommend changing family's attention and focus away from patient's headaches. Instead, emphasize daily activities. If first question of day is 'How are your headaches/Do you have a headache today?', then patient will constantly think about headaches, thus making them worse. Goal is to re-direct attention away from headaches, toward daily activities and other distractions.   10. Helpful Websites: www.AmericanHeadacheSociety.org patenthood.ch www.headaches.org tightmarket.nl www.achenet.org

## 2024-08-05 ENCOUNTER — Encounter: Payer: Self-pay | Admitting: Family Medicine

## 2024-08-05 ENCOUNTER — Telehealth: Admitting: Family Medicine

## 2024-08-05 DIAGNOSIS — G43711 Chronic migraine without aura, intractable, with status migrainosus: Secondary | ICD-10-CM

## 2024-08-05 MED ORDER — AIMOVIG 140 MG/ML ~~LOC~~ SOAJ: 140.0000 mg | SUBCUTANEOUS | 3 refills | Status: AC

## 2024-08-05 MED ORDER — SUMATRIPTAN SUCCINATE 100 MG PO TABS: 100.0000 mg | ORAL_TABLET | Freq: Once | ORAL | 11 refills | Status: AC | PRN

## 2024-08-05 MED ORDER — ONDANSETRON HCL 4 MG PO TABS: 4.0000 mg | ORAL_TABLET | Freq: Three times a day (TID) | ORAL | 0 refills | Status: AC | PRN

## 2025-08-18 ENCOUNTER — Telehealth: Admitting: Family Medicine
